# Patient Record
Sex: Female | Born: 1935 | Race: White | Hispanic: No | Smoking: Never smoker
Health system: Southern US, Community
[De-identification: ages and names within clinical notes are randomized; demographics above are authoritative.]

## PROBLEM LIST (undated history)

## (undated) DIAGNOSIS — M159 Polyosteoarthritis, unspecified: Secondary | ICD-10-CM

## (undated) DIAGNOSIS — E669 Obesity, unspecified: Secondary | ICD-10-CM

## (undated) DIAGNOSIS — K209 Esophagitis, unspecified: Secondary | ICD-10-CM

## (undated) DIAGNOSIS — I872 Venous insufficiency (chronic) (peripheral): Secondary | ICD-10-CM

## (undated) DIAGNOSIS — I803 Phlebitis and thrombophlebitis of lower extremities, unspecified: Secondary | ICD-10-CM

## (undated) DIAGNOSIS — M1990 Unspecified osteoarthritis, unspecified site: Secondary | ICD-10-CM

## (undated) DIAGNOSIS — Z86010 Personal history of colonic polyps: Secondary | ICD-10-CM

## (undated) DIAGNOSIS — I82409 Acute embolism and thrombosis of unspecified deep veins of unspecified lower extremity: Secondary | ICD-10-CM

## (undated) DIAGNOSIS — M109 Gout, unspecified: Secondary | ICD-10-CM

## (undated) DIAGNOSIS — R609 Edema, unspecified: Secondary | ICD-10-CM

## (undated) DIAGNOSIS — M25559 Pain in unspecified hip: Secondary | ICD-10-CM

## (undated) DIAGNOSIS — N951 Menopausal and female climacteric states: Secondary | ICD-10-CM

## (undated) DIAGNOSIS — I10 Essential (primary) hypertension: Secondary | ICD-10-CM

## (undated) HISTORY — DX: Esophagitis, unspecified: K20.9

## (undated) HISTORY — DX: Personal history of colonic polyps: Z86.010

## (undated) HISTORY — DX: Pain in unspecified hip: M25.559

## (undated) HISTORY — DX: Venous insufficiency (chronic) (peripheral): I87.2

## (undated) HISTORY — DX: Obesity, unspecified: E66.9

## (undated) HISTORY — DX: Gout, unspecified: M10.9

## (undated) HISTORY — DX: Unspecified osteoarthritis, unspecified site: M19.90

## (undated) HISTORY — DX: Acute embolism and thrombosis of unspecified deep veins of unspecified lower extremity: I82.409

## (undated) HISTORY — DX: Menopausal and female climacteric states: N95.1

## (undated) HISTORY — DX: Phlebitis and thrombophlebitis of lower extremities, unspecified: I80.3

## (undated) HISTORY — PX: ABDOMINAL HYSTERECTOMY: SHX81

## (undated) HISTORY — DX: Polyosteoarthritis, unspecified: M15.9

## (undated) HISTORY — DX: Essential (primary) hypertension: I10

## (undated) HISTORY — DX: Edema, unspecified: R60.9

---

## 1998-10-06 ENCOUNTER — Other Ambulatory Visit: Admission: RE | Admit: 1998-10-06 | Discharge: 1998-10-06 | Payer: Self-pay | Admitting: Obstetrics and Gynecology

## 1999-04-14 ENCOUNTER — Emergency Department (HOSPITAL_COMMUNITY): Admission: EM | Admit: 1999-04-14 | Discharge: 1999-04-14 | Payer: Self-pay | Admitting: Emergency Medicine

## 1999-04-14 ENCOUNTER — Encounter: Payer: Self-pay | Admitting: Emergency Medicine

## 1999-10-17 ENCOUNTER — Other Ambulatory Visit: Admission: RE | Admit: 1999-10-17 | Discharge: 1999-10-17 | Payer: Self-pay | Admitting: Obstetrics and Gynecology

## 2000-10-22 ENCOUNTER — Other Ambulatory Visit: Admission: RE | Admit: 2000-10-22 | Discharge: 2000-10-22 | Payer: Self-pay | Admitting: Obstetrics and Gynecology

## 2002-07-15 ENCOUNTER — Encounter: Payer: Self-pay | Admitting: Internal Medicine

## 2003-05-26 ENCOUNTER — Emergency Department (HOSPITAL_COMMUNITY): Admission: EM | Admit: 2003-05-26 | Discharge: 2003-05-26 | Payer: Self-pay | Admitting: Family Medicine

## 2003-05-26 ENCOUNTER — Ambulatory Visit (HOSPITAL_COMMUNITY): Admission: RE | Admit: 2003-05-26 | Discharge: 2003-05-26 | Payer: Self-pay | Admitting: Family Medicine

## 2004-01-19 ENCOUNTER — Ambulatory Visit: Payer: Self-pay | Admitting: Internal Medicine

## 2004-07-05 ENCOUNTER — Ambulatory Visit: Payer: Self-pay | Admitting: Internal Medicine

## 2004-10-10 ENCOUNTER — Ambulatory Visit: Payer: Self-pay | Admitting: Internal Medicine

## 2004-10-18 ENCOUNTER — Ambulatory Visit: Payer: Self-pay | Admitting: Cardiology

## 2004-10-18 ENCOUNTER — Ambulatory Visit: Payer: Self-pay | Admitting: Internal Medicine

## 2004-11-24 ENCOUNTER — Ambulatory Visit: Payer: Self-pay | Admitting: Internal Medicine

## 2004-11-24 ENCOUNTER — Encounter: Admission: RE | Admit: 2004-11-24 | Discharge: 2004-11-24 | Payer: Self-pay | Admitting: Internal Medicine

## 2004-11-27 ENCOUNTER — Ambulatory Visit: Payer: Self-pay | Admitting: Internal Medicine

## 2004-12-04 ENCOUNTER — Ambulatory Visit: Payer: Self-pay | Admitting: Internal Medicine

## 2005-02-14 ENCOUNTER — Ambulatory Visit: Payer: Self-pay | Admitting: Internal Medicine

## 2005-07-30 ENCOUNTER — Ambulatory Visit: Payer: Self-pay | Admitting: Internal Medicine

## 2006-01-22 ENCOUNTER — Ambulatory Visit: Payer: Self-pay | Admitting: Internal Medicine

## 2006-01-22 LAB — CONVERTED CEMR LAB
ALT: 14 units/L (ref 0–40)
AST: 16 units/L (ref 0–37)
Albumin: 3.4 g/dL — ABNORMAL LOW (ref 3.5–5.2)
Alkaline Phosphatase: 71 units/L (ref 39–117)
BUN: 17 mg/dL (ref 6–23)
Basophils Absolute: 0.1 10*3/uL (ref 0.0–0.1)
Basophils Relative: 0.8 % (ref 0.0–1.0)
CO2: 32 meq/L (ref 19–32)
Calcium: 9.8 mg/dL (ref 8.4–10.5)
Chloride: 103 meq/L (ref 96–112)
Chol/HDL Ratio, serum: 3
Cholesterol: 149 mg/dL (ref 0–200)
Creatinine, Ser: 0.7 mg/dL (ref 0.4–1.2)
Eosinophil percent: 3.3 % (ref 0.0–5.0)
GFR calc non Af Amer: 88 mL/min
Glomerular Filtration Rate, Af Am: 107 mL/min/{1.73_m2}
Glucose, Bld: 96 mg/dL (ref 70–99)
HCT: 38.8 % (ref 36.0–46.0)
HDL: 49.7 mg/dL (ref >39.0)
Hemoglobin: 12.9 g/dL (ref 12.0–15.0)
LDL Cholesterol: 79 mg/dL (ref 0–99)
Lymphocytes Relative: 25.4 % (ref 12.0–46.0)
MCHC: 33.4 g/dL (ref 30.0–36.0)
MCV: 89.3 fL (ref 78.0–100.0)
Monocytes Absolute: 0.6 10*3/uL (ref 0.2–0.7)
Monocytes Relative: 5.8 % (ref 3.0–11.0)
Neutro Abs: 6.2 10*3/uL (ref 1.4–7.7)
Neutrophils Relative %: 64.7 % (ref 43.0–77.0)
Platelets: 435 10*3/uL — ABNORMAL HIGH (ref 150–400)
Potassium: 4.2 meq/L (ref 3.5–5.1)
RBC: 4.34 M/uL (ref 3.87–5.11)
RDW: 13.1 % (ref 11.5–14.6)
Sodium: 143 meq/L (ref 135–145)
TSH: 1.75 microintl units/mL (ref 0.35–5.50)
Total Bilirubin: 0.7 mg/dL (ref 0.3–1.2)
Total Protein: 7 g/dL (ref 6.0–8.3)
Triglyceride fasting, serum: 104 mg/dL (ref 0–149)
VLDL: 21 mg/dL (ref 0–40)
WBC: 9.6 10*3/uL (ref 4.5–10.5)

## 2006-01-29 ENCOUNTER — Ambulatory Visit: Payer: Self-pay | Admitting: Internal Medicine

## 2006-05-23 ENCOUNTER — Encounter: Payer: Self-pay | Admitting: Internal Medicine

## 2006-07-08 ENCOUNTER — Ambulatory Visit: Payer: Self-pay | Admitting: Internal Medicine

## 2006-09-27 ENCOUNTER — Ambulatory Visit: Payer: Self-pay | Admitting: Internal Medicine

## 2006-09-27 DIAGNOSIS — N951 Menopausal and female climacteric states: Secondary | ICD-10-CM

## 2006-09-27 DIAGNOSIS — K209 Esophagitis, unspecified without bleeding: Secondary | ICD-10-CM

## 2006-09-27 DIAGNOSIS — I10 Essential (primary) hypertension: Secondary | ICD-10-CM

## 2006-09-27 DIAGNOSIS — M159 Polyosteoarthritis, unspecified: Secondary | ICD-10-CM

## 2006-09-27 DIAGNOSIS — E669 Obesity, unspecified: Secondary | ICD-10-CM

## 2006-09-27 HISTORY — DX: Polyosteoarthritis, unspecified: M15.9

## 2006-09-27 HISTORY — DX: Menopausal and female climacteric states: N95.1

## 2006-09-27 HISTORY — DX: Esophagitis, unspecified without bleeding: K20.90

## 2006-09-27 HISTORY — DX: Obesity, unspecified: E66.9

## 2006-11-28 ENCOUNTER — Ambulatory Visit: Payer: Self-pay | Admitting: Internal Medicine

## 2006-11-28 DIAGNOSIS — Z860100 Personal history of colon polyps, unspecified: Secondary | ICD-10-CM | POA: Insufficient documentation

## 2006-11-28 DIAGNOSIS — M1990 Unspecified osteoarthritis, unspecified site: Secondary | ICD-10-CM

## 2006-11-28 DIAGNOSIS — I10 Essential (primary) hypertension: Secondary | ICD-10-CM | POA: Insufficient documentation

## 2006-11-28 HISTORY — DX: Personal history of colon polyps, unspecified: Z86.0100

## 2006-11-28 HISTORY — DX: Unspecified osteoarthritis, unspecified site: M19.90

## 2006-11-28 HISTORY — DX: Essential (primary) hypertension: I10

## 2006-11-28 HISTORY — DX: Personal history of colonic polyps: Z86.010

## 2007-01-30 ENCOUNTER — Ambulatory Visit: Payer: Self-pay | Admitting: Internal Medicine

## 2007-01-30 DIAGNOSIS — M25559 Pain in unspecified hip: Secondary | ICD-10-CM

## 2007-01-30 HISTORY — DX: Pain in unspecified hip: M25.559

## 2007-02-14 ENCOUNTER — Telehealth: Payer: Self-pay | Admitting: Internal Medicine

## 2007-02-21 ENCOUNTER — Ambulatory Visit: Payer: Self-pay | Admitting: Internal Medicine

## 2007-02-21 DIAGNOSIS — R609 Edema, unspecified: Secondary | ICD-10-CM

## 2007-02-21 HISTORY — DX: Edema, unspecified: R60.9

## 2007-03-06 ENCOUNTER — Telehealth: Payer: Self-pay | Admitting: Internal Medicine

## 2007-03-06 ENCOUNTER — Encounter: Payer: Self-pay | Admitting: Internal Medicine

## 2007-03-06 ENCOUNTER — Ambulatory Visit: Payer: Self-pay

## 2007-03-07 ENCOUNTER — Ambulatory Visit: Payer: Self-pay | Admitting: Internal Medicine

## 2007-03-07 DIAGNOSIS — I803 Phlebitis and thrombophlebitis of lower extremities, unspecified: Secondary | ICD-10-CM

## 2007-03-07 HISTORY — DX: Phlebitis and thrombophlebitis of lower extremities, unspecified: I80.3

## 2007-03-10 ENCOUNTER — Ambulatory Visit: Payer: Self-pay | Admitting: Internal Medicine

## 2007-03-10 DIAGNOSIS — I82409 Acute embolism and thrombosis of unspecified deep veins of unspecified lower extremity: Secondary | ICD-10-CM | POA: Insufficient documentation

## 2007-03-10 HISTORY — DX: Acute embolism and thrombosis of unspecified deep veins of unspecified lower extremity: I82.409

## 2007-03-10 LAB — CONVERTED CEMR LAB
INR: 1
Prothrombin Time: 12.4 s

## 2007-03-12 ENCOUNTER — Encounter: Payer: Self-pay | Admitting: Internal Medicine

## 2007-03-14 ENCOUNTER — Ambulatory Visit: Payer: Self-pay | Admitting: Internal Medicine

## 2007-03-14 LAB — CONVERTED CEMR LAB
INR: 1.7
Prothrombin Time: 16 s

## 2007-03-21 ENCOUNTER — Ambulatory Visit: Payer: Self-pay | Admitting: Internal Medicine

## 2007-03-21 LAB — CONVERTED CEMR LAB
INR: 2.3
Prothrombin Time: 18.4 s

## 2007-04-18 ENCOUNTER — Ambulatory Visit: Payer: Self-pay | Admitting: Internal Medicine

## 2007-04-18 LAB — CONVERTED CEMR LAB
INR: 3.2
Prothrombin Time: 21.5 s

## 2007-05-16 ENCOUNTER — Ambulatory Visit: Payer: Self-pay | Admitting: Internal Medicine

## 2007-05-16 LAB — CONVERTED CEMR LAB
INR: 1.9
Prothrombin Time: 16.9 s

## 2007-05-28 ENCOUNTER — Ambulatory Visit: Admission: RE | Admit: 2007-05-28 | Discharge: 2007-05-28 | Payer: Self-pay | Admitting: Gynecology

## 2007-05-29 ENCOUNTER — Ambulatory Visit: Payer: Self-pay | Admitting: Internal Medicine

## 2007-05-29 LAB — CONVERTED CEMR LAB
ALT: 16 units/L (ref 0–35)
Basophils Absolute: 0 10*3/uL (ref 0.0–0.1)
Basophils Relative: 0.6 % (ref 0.0–1.0)
Blood in Urine, dipstick: NEGATIVE
CO2: 32 meq/L (ref 19–32)
Calcium: 9.7 mg/dL (ref 8.4–10.5)
Cholesterol: 159 mg/dL (ref 0–200)
Creatinine, Ser: 0.6 mg/dL (ref 0.4–1.2)
GFR calc Af Amer: 127 mL/min
Glucose, Bld: 112 mg/dL — ABNORMAL HIGH (ref 70–99)
Glucose, Urine, Semiquant: NEGATIVE
HCT: 40.7 % (ref 36.0–46.0)
Hemoglobin: 13.3 g/dL (ref 12.0–15.0)
LDL Cholesterol: 106 mg/dL — ABNORMAL HIGH (ref 0–99)
Lymphocytes Relative: 32 % (ref 12.0–46.0)
MCHC: 32.8 g/dL (ref 30.0–36.0)
Monocytes Absolute: 0.5 10*3/uL (ref 0.1–1.0)
Neutro Abs: 4.8 10*3/uL (ref 1.4–7.7)
Nitrite: NEGATIVE
Protein, U semiquant: NEGATIVE
RBC: 4.6 M/uL (ref 3.87–5.11)
RDW: 13.4 % (ref 11.5–14.6)
Specific Gravity, Urine: >=1.03
TSH: 1.59 microintl units/mL (ref 0.35–5.50)
Total Protein: 6.9 g/dL (ref 6.0–8.3)
Triglycerides: 83 mg/dL (ref 0–149)
Urobilinogen, UA: 0.2
WBC Urine, dipstick: NEGATIVE
pH: 7

## 2007-06-04 ENCOUNTER — Ambulatory Visit: Payer: Self-pay | Admitting: Internal Medicine

## 2007-06-04 DIAGNOSIS — M109 Gout, unspecified: Secondary | ICD-10-CM

## 2007-06-04 HISTORY — DX: Gout, unspecified: M10.9

## 2007-06-13 ENCOUNTER — Ambulatory Visit: Payer: Self-pay | Admitting: Internal Medicine

## 2007-06-13 LAB — CONVERTED CEMR LAB
INR: 2
Prothrombin Time: 17.3 s

## 2007-06-17 ENCOUNTER — Ambulatory Visit: Payer: Self-pay | Admitting: Internal Medicine

## 2007-07-10 ENCOUNTER — Ambulatory Visit: Payer: Self-pay | Admitting: Internal Medicine

## 2007-07-23 ENCOUNTER — Telehealth: Payer: Self-pay | Admitting: Internal Medicine

## 2007-08-07 ENCOUNTER — Ambulatory Visit: Payer: Self-pay | Admitting: Internal Medicine

## 2007-09-04 ENCOUNTER — Ambulatory Visit: Payer: Self-pay | Admitting: Internal Medicine

## 2007-09-04 LAB — CONVERTED CEMR LAB
INR: 1.7
Prothrombin Time: 15.9 s

## 2007-09-22 ENCOUNTER — Ambulatory Visit: Payer: Self-pay | Admitting: Internal Medicine

## 2007-10-28 ENCOUNTER — Ambulatory Visit: Payer: Self-pay

## 2007-10-28 ENCOUNTER — Ambulatory Visit: Payer: Self-pay | Admitting: Internal Medicine

## 2007-10-28 ENCOUNTER — Encounter (INDEPENDENT_AMBULATORY_CARE_PROVIDER_SITE_OTHER): Payer: Self-pay | Admitting: *Deleted

## 2007-10-28 ENCOUNTER — Telehealth: Payer: Self-pay | Admitting: Internal Medicine

## 2007-11-04 ENCOUNTER — Telehealth: Payer: Self-pay | Admitting: Internal Medicine

## 2007-11-18 ENCOUNTER — Ambulatory Visit: Payer: Self-pay | Admitting: Internal Medicine

## 2007-11-26 ENCOUNTER — Telehealth: Payer: Self-pay | Admitting: Internal Medicine

## 2007-11-26 ENCOUNTER — Encounter: Payer: Self-pay | Admitting: Internal Medicine

## 2008-01-14 ENCOUNTER — Ambulatory Visit: Payer: Self-pay | Admitting: Internal Medicine

## 2008-02-16 ENCOUNTER — Ambulatory Visit: Payer: Self-pay | Admitting: Gastroenterology

## 2008-03-01 ENCOUNTER — Encounter: Payer: Self-pay | Admitting: Gastroenterology

## 2008-03-01 ENCOUNTER — Ambulatory Visit: Payer: Self-pay | Admitting: Gastroenterology

## 2008-03-02 ENCOUNTER — Encounter: Payer: Self-pay | Admitting: Gastroenterology

## 2008-03-31 ENCOUNTER — Ambulatory Visit: Payer: Self-pay | Admitting: Internal Medicine

## 2008-03-31 DIAGNOSIS — R079 Chest pain, unspecified: Secondary | ICD-10-CM | POA: Insufficient documentation

## 2008-04-30 ENCOUNTER — Telehealth: Payer: Self-pay | Admitting: Internal Medicine

## 2008-05-27 ENCOUNTER — Encounter: Payer: Self-pay | Admitting: Internal Medicine

## 2008-06-11 ENCOUNTER — Ambulatory Visit: Payer: Self-pay | Admitting: Internal Medicine

## 2008-06-11 LAB — CONVERTED CEMR LAB
ALT: 20 units/L (ref 0–35)
AST: 22 units/L (ref 0–37)
BUN: 21 mg/dL (ref 6–23)
Bilirubin Urine: NEGATIVE
Eosinophils Relative: 6.9 % — ABNORMAL HIGH (ref 0.0–5.0)
GFR calc non Af Amer: 104.66 mL/min (ref >60)
Glucose, Urine, Semiquant: NEGATIVE
HCT: 39.5 % (ref 36.0–46.0)
Hemoglobin: 13.4 g/dL (ref 12.0–15.0)
Ketones, urine, test strip: NEGATIVE
Lymphs Abs: 2.4 10*3/uL (ref 0.7–4.0)
Monocytes Relative: 7.8 % (ref 3.0–12.0)
Platelets: 320 10*3/uL (ref 150.0–400.0)
Potassium: 3.5 meq/L (ref 3.5–5.1)
RBC: 4.48 M/uL (ref 3.87–5.11)
Sodium: 141 meq/L (ref 135–145)
Specific Gravity, Urine: <1.005
TSH: 0.81 microintl units/mL (ref 0.35–5.50)
Total Bilirubin: 0.7 mg/dL (ref 0.3–1.2)
WBC: 8.8 10*3/uL (ref 4.5–10.5)
pH: 5

## 2008-07-29 ENCOUNTER — Ambulatory Visit: Payer: Self-pay | Admitting: Internal Medicine

## 2008-11-17 ENCOUNTER — Ambulatory Visit (HOSPITAL_COMMUNITY): Admission: RE | Admit: 2008-11-17 | Discharge: 2008-11-18 | Payer: Self-pay | Admitting: Obstetrics and Gynecology

## 2008-11-17 ENCOUNTER — Encounter (INDEPENDENT_AMBULATORY_CARE_PROVIDER_SITE_OTHER): Payer: Self-pay | Admitting: Obstetrics and Gynecology

## 2009-01-28 ENCOUNTER — Ambulatory Visit: Payer: Self-pay | Admitting: Internal Medicine

## 2009-06-07 ENCOUNTER — Encounter: Payer: Self-pay | Admitting: Internal Medicine

## 2009-07-29 ENCOUNTER — Ambulatory Visit: Payer: Self-pay | Admitting: Internal Medicine

## 2009-09-02 ENCOUNTER — Ambulatory Visit: Payer: Self-pay

## 2009-09-02 ENCOUNTER — Encounter: Payer: Self-pay | Admitting: Cardiovascular Disease

## 2009-09-02 ENCOUNTER — Telehealth: Payer: Self-pay | Admitting: Family Medicine

## 2009-09-02 ENCOUNTER — Ambulatory Visit: Payer: Self-pay | Admitting: Family Medicine

## 2009-09-06 ENCOUNTER — Ambulatory Visit: Payer: Self-pay | Admitting: Internal Medicine

## 2009-09-06 ENCOUNTER — Telehealth (INDEPENDENT_AMBULATORY_CARE_PROVIDER_SITE_OTHER): Payer: Self-pay | Admitting: *Deleted

## 2009-09-06 DIAGNOSIS — I872 Venous insufficiency (chronic) (peripheral): Secondary | ICD-10-CM

## 2009-09-06 HISTORY — DX: Venous insufficiency (chronic) (peripheral): I87.2

## 2009-09-07 ENCOUNTER — Telehealth: Payer: Self-pay | Admitting: Internal Medicine

## 2009-09-13 ENCOUNTER — Encounter: Payer: Self-pay | Admitting: Internal Medicine

## 2009-10-18 ENCOUNTER — Encounter: Payer: Self-pay | Admitting: Internal Medicine

## 2009-10-20 ENCOUNTER — Ambulatory Visit: Payer: Self-pay | Admitting: Vascular Surgery

## 2009-10-20 ENCOUNTER — Encounter: Payer: Self-pay | Admitting: Internal Medicine

## 2009-10-25 ENCOUNTER — Encounter: Payer: Self-pay | Admitting: Internal Medicine

## 2009-11-16 ENCOUNTER — Ambulatory Visit: Payer: Self-pay | Admitting: Internal Medicine

## 2009-11-22 ENCOUNTER — Encounter: Payer: Self-pay | Admitting: Internal Medicine

## 2010-01-27 ENCOUNTER — Encounter: Payer: Self-pay | Admitting: Internal Medicine

## 2010-01-27 ENCOUNTER — Ambulatory Visit: Payer: Self-pay | Admitting: Internal Medicine

## 2010-01-27 LAB — CONVERTED CEMR LAB
AST: 16 units/L (ref 0–37)
Albumin: 3.8 g/dL (ref 3.5–5.2)
Alkaline Phosphatase: 92 units/L (ref 39–117)
Basophils Relative: 0.7 % (ref 0.0–3.0)
CO2: 31 meq/L (ref 19–32)
Calcium: 9.5 mg/dL (ref 8.4–10.5)
GFR calc non Af Amer: 117.65 mL/min (ref >60)
HDL: 49 mg/dL (ref >39.00)
Hemoglobin: 13.8 g/dL (ref 12.0–15.0)
Lymphocytes Relative: 28.1 % (ref 12.0–46.0)
Monocytes Relative: 6.4 % (ref 3.0–12.0)
Neutro Abs: 5.6 10*3/uL (ref 1.4–7.7)
RBC: 4.5 M/uL (ref 3.87–5.11)
Sodium: 141 meq/L (ref 135–145)
Total CHOL/HDL Ratio: 3
Total Protein: 6.9 g/dL (ref 6.0–8.3)
VLDL: 12.6 mg/dL (ref 0.0–40.0)
WBC: 9.1 10*3/uL (ref 4.5–10.5)

## 2010-03-30 NOTE — Letter (Signed)
Summary: [facility]  [facility]   Imported By: Maryln Gottron 10/28/2009 15:36:02  _____________________________________________________________________  External Attachment:    Type:   Image     Comment:   External Document

## 2010-03-30 NOTE — Assessment & Plan Note (Signed)
Summary: R LEG PAIN // RS   Vital Signs:  Patient profile:   75 year old female Weight:      211 pounds Temp:     97.9 degrees F oral BP sitting:   122 / 76  (right arm) Cuff size:   regular  Vitals Entered By: Duard Brady LPN (September 06, 2009 10:21 AM) CC: c/o no improvment in (R) leg pain - worse Is Patient Diabetic? No   CC:  c/o no improvment in (R) leg pain - worse.  History of Present Illness: a 75 year old patient has a history of right leg DVT last treated in 2009.  She presents with a 10-day history of increasing pain and swelling involving the right leg.  Five days ago.  She was evaluated and venous Doppler examination was negative.  She continues to have persistent pain and swelling.  Denies any pulmonary complaints.  Blood pressure regimen includes diuretic therapy.  Allergies: 1)  ! Sulf-10 2)  ! Bactrim Ds (Sulfamethoxazole-Trimethoprim)  Past History:  Past Medical History: Colonic polyps, hx of Hypertension Osteoarthritis history of lower extremity DVT January 2009 Fibroids impaired glucose tolerance chronic venous insufficiency  Review of Systems       The patient complains of peripheral edema and difficulty walking.  The patient denies anorexia, fever, weight loss, weight gain, vision loss, decreased hearing, hoarseness, chest pain, syncope, dyspnea on exertion, prolonged cough, headaches, hemoptysis, abdominal pain, melena, hematochezia, severe indigestion/heartburn, hematuria, incontinence, genital sores, muscle weakness, suspicious skin lesions, transient blindness, depression, unusual weight change, abnormal bleeding, enlarged lymph nodes, angioedema, and breast masses.    Physical Exam  General:  overweight-appearing.  no distress.  Blood pressure low normaloverweight-appearing.   Neck:  No deformities, masses, or tenderness noted. Lungs:  Normal respiratory effort, chest expands symmetrically. Lungs are clear to auscultation, no crackles or  wheezes. Heart:  Normal rate and regular rhythm. S1 and S2 normal without gallop, murmur, click, rub or other extra sounds. Extremities:  there is slight tenderness and soft tissue swelling involving her right leg distal to the knee   Impression & Recommendations:  Problem # 1:  LEG EDEMA (ICD-782.3)  Her updated medication list for this problem includes:    Hydrochlorothiazide 25 Mg Tabs (Hydrochlorothiazide) .Marland Kitchen... 1 once daily will review the results of the venous Doppler that are not available in the chart.  Will refer to [facility] for further management of suspected chronic venous insufficiency    Her updated medication list for this problem includes:    Hydrochlorothiazide 25 Mg Tabs (Hydrochlorothiazide) .Marland Kitchen... 1 once daily  Problem # 2:  HYPERTENSION (ICD-401.9)  Her updated medication list for this problem includes:    Hydrochlorothiazide 25 Mg Tabs (Hydrochlorothiazide) .Marland Kitchen... 1 once daily  Her updated medication list for this problem includes:    Hydrochlorothiazide 25 Mg Tabs (Hydrochlorothiazide) .Marland Kitchen... 1 once daily  Complete Medication List: 1)  Klor-con M20 20 Meq Tbcr (Potassium chloride crys cr) .Marland Kitchen.. 1 once daily 2)  Hydrochlorothiazide 25 Mg Tabs (Hydrochlorothiazide) .Marland Kitchen.. 1 once daily 3)  Clobetasol Propionate E 0.05 % Crea (Clobetasol prop emollient base) .... Use as directed as needed 4)  Triamcinolone Acetonide 0.1 % Crea (Triamcinolone acetonide) .... Applied twice daily to affected area as needed 5)  Tramadol Hcl 50 Mg Tabs (Tramadol hcl) .... One every 6 hours as needed for pain  Other Orders: [facility] (Vascular)  Patient Instructions: 1)  Limit your Sodium (Salt). 2)  Take 400-600mg  of Ibuprofen (Advil, Motrin) with food every  4-6 hours as needed for relief of pain or comfort of fever. 3)  follow-up vascular surgery Prescriptions: TRAMADOL HCL 50 MG TABS (TRAMADOL HCL) one every 6 hours as needed for pain  #90 x 4   Entered and Authorized by:   Gordy Savers  MD   Signed by:   Gordy Savers  MD on 09/06/2009   Method used:   Electronically to        [facility] [address] [phone]* (retail)       [address]       [place], [place]  [address]       Ph: [phone] or [phone]       Fax: [phone]   RxID:   [id number]

## 2010-03-30 NOTE — Assessment & Plan Note (Signed)
Summary: FLU-SHOT/RCD  Nurse Visit  Appended Document: FLU-SHOT/RCD    Nurse Visit   Immunizations Administered:  Influenza Vaccine # 1:    Vaccine Type: Fluvax MCR    Site: left deltoid    Mfr: GlaxoSmithKline    Dose: 0.5 ml    Route: IM    Given by: Duard Brady LPN    Exp. Date: 08/26/2010    Lot #: [id number]    VIS given: 09/20/09 version given December 05, 2009.    Physician counseled: yes  Flu Vaccine Consent Questions:    Do you have a history of severe allergic reactions to this vaccine? no    Any prior history of allergic reactions to egg and/or gelatin? no    Do you have a sensitivity to the preservative Thimersol? no    Do you have a past history of Guillan-Barre Syndrome? no    Do you currently have an acute febrile illness? no    Have you ever had a severe reaction to latex? no    Vaccine information given and explained to patient? yes    Are you currently pregnant? no  Orders Added: 1)  Influenza Vaccine MCR [00025]

## 2010-03-30 NOTE — Assessment & Plan Note (Signed)
Summary: 6 month rov/njr   Vital Signs:  Patient profile:   75 year old female Weight:      215 pounds Temp:     97.6 degrees F oral BP sitting:   136 / 80  (right arm) Cuff size:   regular  Vitals Entered By: Duard Brady LPN (July 30, 9627 8:24 AM) CC: 6 mos rov - doing well , please check (R) leg  Is Patient Diabetic? No   CC:  6 mos rov - doing well  and please check (R) leg .  History of Present Illness: 75 year old patient who is seen today for follow-up of her hypertension.  She has osteoarthritis and venous insufficiency.  She is doing quite well, although under a considerable stress.  She cares for a disabled son.  Her blood pressure has been stable.  She has had a recent mammogram  Preventive Screening-Counseling & Management  Alcohol-Tobacco     Smoking Status: never  Allergies: 1)  ! Sulf-10 2)  ! Bactrim Ds (Sulfamethoxazole-Trimethoprim)  Past History:  Past Medical History: Reviewed history from 07/29/2008 and no changes required. Colonic polyps, hx of Hypertension Osteoarthritis history of lower extremity DVT January 2009 Fibroids impaired glucose tolerance  Past Surgical History: Reviewed history from 01/28/2009 and no changes required. Cholecystectomy  1998 Hysterectomy   cataract surgery os colonoscopy in January 2010  bilateral salpingo-oophorectomy  in September 2010  Review of Systems  The patient denies anorexia, fever, weight loss, weight gain, vision loss, decreased hearing, hoarseness, chest pain, syncope, dyspnea on exertion, peripheral edema, prolonged cough, headaches, hemoptysis, abdominal pain, melena, hematochezia, severe indigestion/heartburn, hematuria, incontinence, genital sores, muscle weakness, suspicious skin lesions, transient blindness, difficulty walking, depression, unusual weight change, abnormal bleeding, enlarged lymph nodes, angioedema, and breast masses.    Physical Exam  General:  overweight-appearing.   130/80overweight-appearing.   Head:  Normocephalic and atraumatic without obvious abnormalities. No apparent alopecia or balding. Eyes:  No corneal or conjunctival inflammation noted. EOMI. Perrla. Funduscopic exam benign, without hemorrhages, exudates or papilledema. Vision grossly normal. Mouth:  Oral mucosa and oropharynx without lesions or exudates.  Teeth in good repair. Neck:  No deformities, masses, or tenderness noted. Lungs:  Normal respiratory effort, chest expands symmetrically. Lungs are clear to auscultation, no crackles or wheezes. Heart:  Normal rate and regular rhythm. S1 and S2 normal without gallop, murmur, click, rub or other extra sounds. Abdomen:  Bowel sounds positive,abdomen soft and non-tender without masses, organomegaly or hernias noted. Msk:  No deformity or scoliosis noted of thoracic or lumbar spine.   Skin:  erythematous patches scaly rash involving her right lateral lower leg   Impression & Recommendations:  Problem # 1:  OSTEOARTHRITIS (ICD-715.90)  The following medications were removed from the medication list:    Naprosyn 500 Mg Tabs (Naproxen) ..... One by mouth bid  The following medications were removed from the medication list:    Naprosyn 500 Mg Tabs (Naproxen) ..... One by mouth bid  Problem # 2:  HYPERTENSION (ICD-401.9)  Her updated medication list for this problem includes:    Hydrochlorothiazide 25 Mg Tabs (Hydrochlorothiazide) .Marland Kitchen... 1 once daily  Her updated medication list for this problem includes:    Hydrochlorothiazide 25 Mg Tabs (Hydrochlorothiazide) .Marland Kitchen... 1 once daily  Complete Medication List: 1)  Klor-con M20 20 Meq Tbcr (Potassium chloride crys cr) .Marland Kitchen.. 1 once daily 2)  Hydrochlorothiazide 25 Mg Tabs (Hydrochlorothiazide) .Marland Kitchen.. 1 once daily 3)  Clobetasol Propionate E 0.05 % Crea (Clobetasol  prop emollient base) .... Uad 4)  Triamcinolone Acetonide 0.1 % Crea (Triamcinolone acetonide) .... Applied twice daily  Patient  Instructions: 1)  Limit your Sodium (Salt) to less than 2 grams a day(slightly less than 1/2 a teaspoon) to prevent fluid retention, swelling, or worsening of symptoms. 2)  It is important that you exercise regularly at least 20 minutes 5 times a week. If you develop chest pain, have severe difficulty breathing, or feel very tired , stop exercising immediately and seek medical attention. 3)  You need to lose weight. Consider a lower calorie diet and regular exercise.  4)  Check your Blood Pressure regularly. If it is above: 150/90 you should make an appointment. 5)  Please schedule a follow-up appointment in 6 months for CPX 6)  Advised not to eat any food or drink any liquids after 10 PM the night before your procedure. Prescriptions: TRIAMCINOLONE ACETONIDE 0.1 % CREA (TRIAMCINOLONE ACETONIDE) applied twice daily  #120 gm x 2   Entered and Authorized by:   Gordy Savers  MD   Signed by:   Gordy Savers  MD on 07/29/2009   Method used:   Electronically to        [facility] [address] [phone]* (retail)       [address]       [place], [place]  [address]       Ph: [phone] or [phone]       Fax: [phone]   RxID:   [id number] HYDROCHLOROTHIAZIDE 25 MG TABS (HYDROCHLOROTHIAZIDE) 1 once daily  #90 x 6   Entered and Authorized by:   Gordy Savers  MD   Signed by:   Gordy Savers  MD on 07/29/2009   Method used:   Electronically to        [facility] [address] [phone]* (retail)       [address]       [place], [place]  [address]       Ph: [phone] or [phone]       Fax: [phone]   RxID:   [id number] KLOR-CON M20 20 MEQ  TBCR (POTASSIUM CHLORIDE CRYS CR) 1 once daily  #90 x 6   Entered and Authorized by:   Gordy Savers  MD   Signed by:   Gordy Savers  MD on 07/29/2009   Method used:   Electronically to        [facility] [address] [phone]* (retail)       [address]        [place], [place]  [address]       Ph: [phone] or [phone]       Fax: [phone]   RxID:   [id number]

## 2010-03-30 NOTE — Progress Notes (Signed)
Summary: ? about appt  Phone Note Call from Patient   Caller: Patient Call For: Gordy Savers  MD Summary of Call: Vascular appt is August 25, and pt wants to know if this is too long to wait??? [phone] Initial call taken by: Lynann Beaver CMA,  September 07, 2009 12:37 PM  Follow-up for Phone Call        OK not urgent Follow-up by: Gordy Savers  MD,  September 07, 2009 12:44 PM  Additional Follow-up for Phone Call Additional follow up Details #1::        Notified. pt Additional Follow-up by: Lynann Beaver CMA,  September 07, 2009 12:46 PM

## 2010-03-30 NOTE — Letter (Signed)
Summary: [facility]  [facility]   Imported By: Maryln Gottron 11/11/2009 12:59:17  _____________________________________________________________________  External Attachment:    Type:   Image     Comment:   External Document

## 2010-03-30 NOTE — Progress Notes (Signed)
Summary: dopper report  Phone Note Call from Patient Call back at Home Phone [phone]   Caller: Patient Call For: Clydette Privitera Summary of Call: pt has doppler today requesting results Initial call taken by: Heron Sabins,  September 02, 2009 4:23 PM  Follow-up for Phone Call        I called back at this number and no answer.  By verbal report NO evidence for DVT. Follow-up by: Evelena Peat MD,  September 02, 2009 5:51 PM  Additional Follow-up for Phone Call Additional follow up Details #1::        Pt informed and she voiced her understanding Additional Follow-up by: Sid Falcon LPN,  September 03, 979 6:04 PM

## 2010-03-30 NOTE — Assessment & Plan Note (Signed)
Summary: emp--will fast//ccm   Vital Signs:  Patient profile:   75 year old female Height:      65.5 inches Weight:      211 pounds BMI:     34.70 Temp:     97.9 degrees F oral BP sitting:   130 / 82  (right arm) Cuff size:   regular  Vitals Entered By: Duard Brady LPN (January 27, 2010 9:22 AM) CC: cpx - doing ok Is Patient Diabetic? No   CC:  cpx - doing ok.  History of Present Illness: 75 year old patient who is seen today for a wellness exam.  Medical problems include a history of chronic venous insufficiency and osteoarthritis, involving the right knee.  She is followed by orthopedics.  She has hypertension.  History colonic polyps and exogenous obesity.  Here for Medicare AWV:  1.   Risk factors based on Past M, S, F history:bibasilar risk factors include a history of hypertension. 2.   Physical Activities: fairly active and cares for a number of children and grandchildren 3.   Depression/mood: history of depression or mood disorder, although considerable situational stress 4.   Hearing: no hearing impairment 5.   ADL's: independent in all aspects of daily living 6.   Fall Risk: low 7.   Home Safety: no problems identified 8.   Height, weight, &visual acuity:height and weight are stable.  No change in visual acuity 9.   Counseling: heart healthy diet modest weight loss encouraged 10.   Labs ordered based on risk factors: laboratory profile, including lipid panel and TSH will be reviewed 11.           Referral Coordination- GYN referral is scheduled later this month 12.           Care Plan- heart healthy diet, weight loss encouraged 13.            Cognitive Assessment- alert and oriented, with normal affect.  History of memory dysfunction and also executive functioning without difficulty   Preventive Screening-Counseling & Management  Alcohol-Tobacco     Smoking Status: never  Allergies: 1)  ! Sulf-10 2)  ! Bactrim Ds (Sulfamethoxazole-Trimethoprim)  Past  History:  Past Medical History: Reviewed history from 09/06/2009 and no changes required. Colonic polyps, hx of Hypertension Osteoarthritis history of lower extremity DVT January 2009 Fibroids impaired glucose tolerance chronic venous insufficiency  Past Surgical History: Reviewed history from 01/28/2009 and no changes required. Cholecystectomy  1998 Hysterectomy   cataract surgery os colonoscopy in January 2010  bilateral salpingo-oophorectomy  in September 2010  Family History: Reviewed history from 06/17/2007 and no changes required. age 42, history of Parkinson's disease, senile dementia mother has  senile dementia presently 64 one brother in good health one sister died of cardiac complications at age 5, history of EtOH  Social History: Reviewed history from 07/29/2008 and no changes required. cares for a disabled son 4 children, and 3 adopted children.  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    Physical Exam  General:  overweight-appearing.  normal blood pressure Head:  Normocephalic and atraumatic  without obvious abnormalities. No apparent alopecia or balding. Eyes:  No corneal or conjunctival inflammation noted. EOMI. Perrla. Funduscopic exam benign, without hemorrhages, exudates or papilledema. Vision grossly normal. Ears:  External ear exam shows no significant lesions or deformities.  Otoscopic  examination reveals clear canals, tympanic membranes are intact bilaterally without bulging, retraction, inflammation or discharge. Hearing is grossly normal bilaterally. Nose:  External nasal examination shows no deformity or inflammation. Nasal mucosa are pink and moist without lesions or exudates. Mouth:  Oral mucosa and oropharynx without lesions or exudates.  Teeth in good repair. Neck:  No deformities, masses, or tenderness noted. Chest Wall:  No deformities, masses, or tenderness noted. Breasts:  No mass, nodules, thickening, tenderness, bulging, retraction, inflamation, nipple discharge or skin changes noted.   Lungs:  Normal respiratory effort, chest expands symmetrically. Lungs are clear to auscultation, no crackles or wheezes. Heart:  Normal rate and regular rhythm. S1 and S2 normal without gallop, murmur, click, rub or other extra sounds. Abdomen:  Bowel sounds positive,abdomen soft and non-tender without masses, organomegaly or hernias noted. Msk:  No deformity or scoliosis noted of thoracic or lumbar spine.   Pulses:  R and L carotid,radial,femoral,dorsalis pedis and posterior tibial pulses are full and equal bilaterally Extremities:  support hose, right leg Neurologic:  No cranial nerve deficits noted. Station and gait are normal. Plantar reflexes are down-going bilaterally. DTRs are symmetrical throughout. Sensory, motor and coordinative functions appear intact. Skin:  Intact without suspicious lesions or rashes Cervical Nodes:  No lymphadenopathy noted Axillary Nodes:  No palpable lymphadenopathy Inguinal Nodes:  No significant adenopathy Psych:  Cognition and judgment appear intact. Alert and cooperative with normal attention span and concentration. No apparent delusions, illusions, hallucinations   Impression & Recommendations:  Problem # 1:  PHYSICAL EXAMINATION (ICD-V70.0)  Orders: Medicare -1st Annual Wellness Visit [phone]) Venipuncture ([address]) TLB-Lipid Panel  (80061-LIPID) TLB-BMP (Basic Metabolic Panel-BMET) (80048-METABOL) TLB-CBC Platelet - w/Differential (85025-CBCD) TLB-Hepatic/Liver Function Pnl (80076-HEPATIC)  Problem # 2:  VENOUS INSUFFICIENCY, CHRONIC, RIGHT LEG (ICD-459.81)  Orders: EKG w/ Interpretation (93000)  Problem # 3:  OSTEOARTHRITIS (ICD-715.90)  Her updated medication list for this problem includes:    Tramadol Hcl 50 Mg Tabs (Tramadol hcl) ..... One every 6 hours as needed for pain  Orders: TLB-BMP (Basic Metabolic Panel-BMET) (80048-METABOL) TLB-CBC Platelet - w/Differential (85025-CBCD) TLB-Hepatic/Liver Function Pnl (80076-HEPATIC)  Problem # 4:  HYPERTENSION, BENIGN ESSENTIAL (ICD-401.1)  Her updated medication list for this problem includes:    Hydrochlorothiazide 25 Mg Tabs (Hydrochlorothiazide) .Marland Kitchen... 1 once daily  Complete Medication List: 1)  Klor-con M20 20 Meq Tbcr (Potassium chloride crys cr) .Marland Kitchen.. 1 once daily 2)  Hydrochlorothiazide 25 Mg Tabs (Hydrochlorothiazide) .Marland Kitchen.. 1 once daily 3)  Clobetasol Propionate E 0.05 % Crea (Clobetasol prop emollient base) .... Use as directed as needed 4)  Triamcinolone Acetonide 0.1 % Crea (Triamcinolone acetonide) .... Applied twice daily to affected area as needed 5)  Tramadol Hcl 50 Mg Tabs (Tramadol hcl) .... One every 6 hours as needed for pain 6)  Vitamin B-12 1000 Mcg Tabs (Cyanocobalamin) .... Qd  Other Orders: TLB-TSH (Thyroid Stimulating Hormone) (84443-TSH)  Patient Instructions: 1)  Please schedule a follow-up appointment in 6 months. 2)  Limit your Sodium (Salt). 3)  It is important that you exercise regularly at least 20 minutes 5 times a week. If you develop chest pain, have severe difficulty breathing, or feel very tired , stop exercising immediately and seek medical attention. 4)  You need to lose weight.  Consider a lower calorie diet and regular exercise.  5)  Take calcium +Vitamin D daily. Prescriptions: TRAMADOL HCL 50 MG TABS (TRAMADOL HCL)  one every 6 hours as needed for pain  #90 x 4   Entered and Authorized by:   Gordy Savers  MD   Signed by:   Gordy Savers  MD on 01/27/2010   Method used:   Electronically to        [facility] [address] [phone]* (retail)       [address]       [place], [place]  [address]       Ph: [phone] or [phone]       Fax: [phone]   RxID:   [id number] HYDROCHLOROTHIAZIDE 25 MG TABS (HYDROCHLOROTHIAZIDE) 1 once daily  #90 x 6   Entered and Authorized by:   Gordy Savers  MD   Signed by:   Gordy Savers  MD on 01/27/2010   Method used:   Electronically to        [facility] [address] [phone]* (retail)       [address]       [place], [place]  [address]       Ph: [phone] or [phone]       Fax: [phone]   RxID:   [id number] KLOR-CON M20 20 MEQ  TBCR (POTASSIUM CHLORIDE CRYS CR) 1 once daily  #90 x 6   Entered and Authorized by:   Gordy Savers  MD   Signed by:   Gordy Savers  MD on 01/27/2010   Method used:   Electronically to        [facility] [address] [phone]* (retail)       [address]       [place], [place]  [address]       Ph: [phone] or [phone]       Fax: [phone]   RxID:   [id number]    Orders Added: 1)  EKG w/ Interpretation [93000] 2)  Medicare -1st Annual Wellness Visit [G0438] 3)  Est. Patient Level III [38101] 4)  Venipuncture [36415] 5)  TLB-Lipid Panel [80061-LIPID] 6)  TLB-BMP (Basic Metabolic Panel-BMET) [80048-METABOL] 7)  TLB-CBC Platelet - w/Differential [85025-CBCD] 8)  TLB-Hepatic/Liver Function Pnl [80076-HEPATIC] 9)  TLB-TSH (Thyroid Stimulating Hormone) [84443-TSH]  Appended Document: Orders Update     Clinical Lists Changes  Orders: Added new Service order of Specimen Handling (75102) - Signed

## 2010-03-30 NOTE — Miscellaneous (Signed)
Summary: Orders Update  Clinical Lists Changes  Orders: Added new Test order of Venous Duplex Lower Extremity (Venous Duplex Lower) - Signed 

## 2010-03-30 NOTE — Letter (Signed)
Summary: [name]  [name]   Imported By: Maryln Gottron 11/29/2009 15:28:30  _____________________________________________________________________  External Attachment:    Type:   Image     Comment:   External Document

## 2010-03-30 NOTE — Consult Note (Signed)
Summary: [name]  [name]   Imported By: Maryln Gottron 09/19/2009 10:31:37  _____________________________________________________________________  External Attachment:    Type:   Image     Comment:   External Document

## 2010-03-30 NOTE — Assessment & Plan Note (Signed)
Summary: leg pain hx of dvt/njr   Vital Signs:  Patient profile:   75 year old female Temp:     98.3 degrees F oral BP sitting:   150 / 84  (left arm) Cuff size:   large  Vitals Entered By: Sid Falcon LPN (September 02, 1608 2:47 PM) CC: Right lower leg pain   History of Present Illness: Patient seen with one week history of right lower extremity pain in the calf region and some swelling. Prior history of DVT last year same extremity. Denies recent injury. No history of cancer. Nonsmoker. No family history of coagulopathy.  Some progressive swelling through the week.  worse with dependency.  Patient denies any dyspnea, cough, hemoptysis, or any pleuritic pain. Swelling has increased gradually over the week.  Allergies: 1)  ! Sulf-10 2)  ! Bactrim Ds (Sulfamethoxazole-Trimethoprim)  Past History:  Past Medical History: Last updated: 07/29/2008 Colonic polyps, hx of Hypertension Osteoarthritis history of lower extremity DVT January 2009 Fibroids impaired glucose tolerance  Past Surgical History: Last updated: 01/28/2009 Cholecystectomy  1998 Hysterectomy   cataract surgery os colonoscopy in January 2010  bilateral salpingo-oophorectomy  in September 2010  Social History: Last updated: 07/29/2008 cares for a disabled son 4 children, and 3 adopted children.  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    Physical Exam  General:  Well-developed,well-nourished,in no acute distress; alert,appropriate and cooperative throughout examination Neck:  No deformities, masses, or tenderness noted. Lungs:  Normal respiratory effort, chest expands symmetrically. Lungs are clear to  auscultation, no crackles or wheezes. Heart:  normal rate, regular rhythm, no murmur, and no gallop.   Extremities:  patient has some subtle increased edema right lower extremity compared with left. No specific areas of tenderness. Distal foot pulses are normal. Neurologic:  strength normal in all extremities and gait normal.     Impression & Recommendations:  Problem # 1:  LEG EDEMA (ICD-782.3) Assessment Deteriorated need to rule out recurrent DVT. Obtain venous Dopplers Her updated medication list for this problem includes:    Hydrochlorothiazide 25 Mg Tabs (Hydrochlorothiazide) .Marland Kitchen... 1 once daily  Orders: Doppler Referral (Doppler)  Complete Medication List: 1)  Klor-con M20 20 Meq Tbcr (Potassium chloride crys cr) .Marland Kitchen.. 1 once daily 2)  Hydrochlorothiazide 25 Mg Tabs (Hydrochlorothiazide) .Marland Kitchen.. 1 once daily 3)  Clobetasol Propionate E 0.05 % Crea (Clobetasol prop emollient base) .... Use as directed as needed 4)  Triamcinolone Acetonide 0.1 % Crea (Triamcinolone acetonide) .... Applied twice daily to affected area as needed

## 2010-03-30 NOTE — Progress Notes (Signed)
   Doppler Faxed to [facility] @ [phone] [facility]  September 06, 2009 11:10 AM

## 2010-03-30 NOTE — Letter (Signed)
Summary: [name]  [name]   Imported By: Maryln Gottron 10/28/2009 12:41:16  _____________________________________________________________________  External Attachment:    Type:   Image     Comment:   External Document

## 2010-06-02 LAB — COMPREHENSIVE METABOLIC PANEL
ALT: 18 U/L (ref 0–35)
AST: 19 U/L (ref 0–37)
Albumin: 3.7 g/dL (ref 3.5–5.2)
Alkaline Phosphatase: 71 U/L (ref 39–117)
BUN: 10 mg/dL (ref 6–23)
BUN: 9 mg/dL (ref 6–23)
CO2: 29 mEq/L (ref 19–32)
Calcium: 9.5 mg/dL (ref 8.4–10.5)
Creatinine, Ser: 0.53 mg/dL (ref 0.4–1.2)
GFR calc Af Amer: >60 mL/min (ref >60)
GFR calc non Af Amer: >60 mL/min (ref >60)
GFR calc non Af Amer: >60 mL/min (ref >60)
Glucose, Bld: 124 mg/dL — ABNORMAL HIGH (ref 70–99)
Potassium: 3.4 mEq/L — ABNORMAL LOW (ref 3.5–5.1)
Sodium: 139 mEq/L (ref 135–145)
Total Bilirubin: 1 mg/dL (ref 0.3–1.2)
Total Protein: 5.7 g/dL — ABNORMAL LOW (ref 6.0–8.3)

## 2010-06-02 LAB — CBC
HCT: 36.3 % (ref 36.0–46.0)
HCT: 42.1 % (ref 36.0–46.0)
Hemoglobin: 12.5 g/dL (ref 12.0–15.0)
MCHC: 33.4 g/dL (ref 30.0–36.0)
MCHC: 34.3 g/dL (ref 30.0–36.0)
MCV: 89 fL (ref 78.0–100.0)
Platelets: 342 10*3/uL (ref 150–400)
RBC: 4.07 MIL/uL (ref 3.87–5.11)
RDW: 13.6 % (ref 11.5–15.5)

## 2010-06-16 ENCOUNTER — Encounter: Payer: Self-pay | Admitting: Internal Medicine

## 2010-07-11 NOTE — Assessment & Plan Note (Signed)
OFFICE VISIT   Kristina Allison, BLUETT [facility]  DOB:  November 25, 1935                                       10/20/2009  ZOXWR#:[id number]   CHIEF COMPLAINT:  Right leg swelling with ulcer.   HISTORY OF PRESENT ILLNESS:  The patient is a 75 year old female  referred by Dr. Amador Cunas for evaluation of right leg swelling with  ulceration.  The patient has had intermittent problems with swelling in  her right leg for several years.  She previously had a right leg DVT in  January of 2009.  She states she also had a DVT several years prior to  this in the same leg.  She also complains of some arthritis type  symptoms in her right knee and was recently evaluated by orthopedics and  has a workup in progress for this.  She denies wearing prior compression  stockings for relief in her legs.  She has recently developed an  ulceration on the lateral aspect of her right leg.  She states that the  swelling is intermittent in nature but sometimes worse, especially if  she has been on her feet all day.  She denies any claudication type  symptoms.  She has no family history of hypercoagulable state.   Chronic medical problems include hypertension.  Approximately 15 pages  of medical records from [facility] were reviewed today regarding  the patient.  This revealed details regarding her previous problems with  swelling in her leg.   Past medical history is as listed above.   PAST SURGICAL HISTORY:  She had a hysterectomy, cholecystectomy and some  type of ovarian procedure.   SOCIAL HISTORY:  She is married.  She is a housewife.  She has seven  children.  She does not smoke or drink alcohol regularly.   FAMILY HISTORY:  Unremarkable.   REVIEW OF SYSTEMS:  Full 12 point review of systems was performed with  the patient today.  Please see intake referral form for details  regarding this.   MEDICATIONS:  1. Include Klor-Con.  2. Hydrochlorothiazide.  3. Clobestasol.  4.  Triamcinolone cream.  5. Tramadol.  6. Latanoprost eyedrops.  7. The patient was on Coumadin in the past for her acute DVT but no      longer requires this.   ALLERGIES:  Listed to sulfa and Bactrim.   PHYSICAL EXAM:  Vital signs:  Blood pressure is 172/88 in the left arm,  heart rate is 93 and regular, respirations 18.  HEENT:  Unremarkable.  Neck:  Has 2+ carotid pulses without bruit.  Chest:  Clear to  auscultation.  Cardiac:  Regular rate and rhythm without murmur.  Abdomen:  Soft, obese, nontender, nondistended.  No masses.  Extremities:  She has 2+ radial, femoral, dorsalis pedis and posterior  tibial pulses bilaterally.  She has no significant joint deformities.  Neurologic:  Shows symmetric upper extremity and lower extremity motor  strength which is 5/5.  Skin:  Has an erythematous 7 x 5 cm area over  the right lateral aspect of the leg with a 2 cm ulceration in the  central portion of this.  There are also scattered medial varicosities  on the right leg.  These are all fairly small in caliber between 0.5 to  1 cm in diameter.  She does have trace edema in the right lower  extremity  compared to the left.   She had a venous duplex exam performed today which showed incompetence  of the deep and superficial system on the right leg.  She also had  incompetent perforator in the right distal calf.  I reviewed and  interpreted and ordered her vascular exam study.   In summary, the patient has incompetence of the deep and superficial  system in her right leg.  This is most likely postphlebitic in nature  from the previous DVTs.  I reassured her that this is not worrisome for  a DVT currently.  I discussed with her compression therapy to try to  give her some symptomatic relief to help heal up the ulceration on her  right leg as well as decreased swelling.  A prescription was given for  compression stockings 25-30 mmHg today knee high length.  I also  discussed with her the  possibility of laser ablation of her greater  saphenous or possibly her perforating vein at some point in the future  if she wished to do this.  She has opted for conservative management  initially.  She will follow up on an as-needed basis.  Dr. Amador Cunas  will continue to monitor her ulcer.  If this continues to deteriorate  over time with compression therapy we would consider ablation at some  point in the future.     Kristina Allison. Fields, MD  Electronically Signed   CEF/MEDQ  D:  10/20/2009  T:  10/20/2009  Job:  [id number]   cc:   Gordy Savers, MD

## 2010-07-11 NOTE — Procedures (Signed)
DUPLEX DEEP VENOUS EXAM - LOWER EXTREMITY   INDICATION:  History of venous insufficiency/deep venous thrombosis.   HISTORY:  Edema:  Right leg swelling.  Trauma/Surgery:  No.  Pain:  Mild right knee/calf pain.  PE:  No.  Previous DVT:  The patient states a history of DVT in the right leg in  2009.  Anticoagulants:  Other:  History of venous insufficiency.   DUPLEX EXAM:                CFV   SFV   PopV  PTV    GSV                R  L  R  L  R  L  R   L  R  L  Thrombosis    o  o  o     o     o      +  Spontaneous   +  +  +     +     +      +  Phasic        +  +  +     +     +      +  Augmentation  +  +  +     +     +      +  Compressible  +  +  +     +     +      p  Competent     o  o  o     +            o   Legend:  + - yes  o - no  p - partial  D - decreased   IMPRESSION:  1. No evidence of deep vein thrombosis noted in the right lower      extremity.  2. Clinically significant reflux noted in the right common femoral,      superficial femoral, greater saphenous and left common femoral      veins.  3. 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  4. There is incompetent perforator noted in the right distal calf      measuring 0.4 cm.  5. Cystic structure noted in the right medial popliteal fossa.        _____________________________  [name]. Fields, MD   CH/MEDQ  D:  10/21/2009  T:  10/21/2009  Job:  [id number]

## 2010-07-11 NOTE — Consult Note (Signed)
NAME:  Kristina Allison, Kristina Allison                   ACCOUNT NO.:  [id number]   MEDICAL RECORD NO.:  [id number]          PATIENT TYPE:  OUT   LOCATION:  GYN                          FACILITY:  [facility]   PHYSICIAN:  De Blanch, M.D.DATE OF BIRTH:  [birth year]   DATE OF CONSULTATION:  05/28/2007  DATE OF DISCHARGE:                                 CONSULTATION   CHIEF COMPLAINT:  Ovarian cysts.   HISTORY OF PRESENT ILLNESS:  A 75 year old white married female seen in  consultation at the request Dr. Henderson Cloud regarding management of recently  diagnosed bilateral ovarian cysts.  The patient underwent an MRI for  evaluation of left lateral hip and groin pain, which apparently was  worse with weightbearing.  No pathology was found in the hip, although  bilateral ovarian cysts were detected.  The patient has had a follow-up  endovaginal ultrasound which showed bilateral simple ovarian cyst on the  right measuring 1.5 cm and on the left measuring 2.2 cm.  There is no  free fluid in the pelvis and no increased blood flow.   The patient has had a CA-125 measuring 3.3 units per mL and a CEA which  is normal.   The patient has no pelvic symptoms.  She stated denies any GI symptoms,  has no bloating, nausea or weight loss.   PAST MEDICAL HISTORY:   MEDICAL ILLNESSES:  1. Deep venous thrombosis, January 2009.  The etiology of this is      unknown.  The patient is currently taking Coumadin.  She does have      a past history of deep vein thrombosis.  2. Hypertension.   PAST SURGICAL HISTORY:  1. Vaginal hysterectomy.  2. Laparoscopic cholecystectomy.   OBSTETRICAL HISTORY:  Gravida 4.  The patient has also adopted three  children.  She has one child that still lives at home because he has  mental retardation.   DRUG ALLERGIES:  NONE.   FAMILY HISTORY:  Aunt with breast cancer at age 2, mother with breast  cancer.   SOCIAL HISTORY:  The patient is married.  She is retired.  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   CURRENT MEDICATIONS:  Hydrochlorothiazide, potassium chloride,  clobetasol ointment and Xalatan eye drops.   REVIEW OF SYSTEMS:  A 10-point comprehensive review of systems negative  except as noted above.   PHYSICAL EXAMINATION:  GENERAL:  Reveals a pleasant, slightly obese  white female in no acute distress.  VITAL SIGNS:  Pulse is 80, respiratory rate 20, blood pressure 130/70.  ABDOMEN:  Obese, soft, nontender.  No mass, organomegaly, ascites or  hernias noted.  All laparoscopic scars are well-healed.  PELVIC:  EGBUS, vagina, vulva urethra are normal except for a stigmata  of lichen sclerosis.  There is agglutination of the clitoral hood.  The  vagina is otherwise normal, but atrophic, well-supported.  Mild  cystocele is noted.  Bimanual exam reveals no mass, induration or  nodularity.   IMPRESSION:  Bilateral simple ovarian cysts.  I think these most likely  are benign and would,  therefore, recommend that they be followed with  serial ultrasounds.  I would suggest she have a repeat ultrasound of the  ovaries at the end of May or early in June.  We will ask that these be  done at Dr. Kittie Plater office for consistency of technique.  If she  develops any nodularity, solid areas or more complex masses, I would be  happy to see the patient back.  At this juncture, however, it appears  these are benign cysts which are asymptomatic and would recommend that  they be followed.      De Blanch, M.D.  Electronically Signed     DC/MEDQ  D:  05/28/2007  T:  05/28/2007  Job:  [id number]   cc:   Carrington Clamp, M.D.  Fax: [phone]   Telford Nab, R.N.  [address]  [place], [place] [address]   Madlyn Frankel. Charlann Boxer, M.D.  Fax: [phone]

## 2010-07-14 NOTE — Assessment & Plan Note (Signed)
[facility]                            [facility] OFFICE NOTE   Kristina Allison, Kristina Allison                          MRN:          [id number]  DATE:01/29/2006                            DOB:          [birth year]    The patient is a 75 year old female seen today for an annual exam.  She  has history of hypertension, menopausal syndrome, degenerative joint  disease.  Also colonic polyps.  She is followed by closely by  ophthalmology and GI.  Also has a gynecologist for annual exams.  She  has had a remote hysterectomy in 1977, cholecystectomy in 1998.  Doing  well today.  No concerns or complaints.  Last colonoscopy was in 2004.   PHYSICAL EXAMINATION:  GENERAL:  Mildly overweight, white female in no  acute distress.  VITAL SIGNS:  Blood pressure 130/82.  SKIN:  Negative.  HEENT:  Fundi, ear, nose, and throat clear.  NECK:  No bruits.  CHEST: Was clear.  BREASTS: Negative.  CARDIOVASCULAR:  Normal heart sounds.  No murmurs.  ABDOMEN:  Obese.  Off and on tenderness.  No organomegaly.  PELVIC:  Deferred.  EXTREMITIES:  Full peripheral pulses.  No edema.  NEUROLOGIC:  Negative.   IMPRESSION:  1. Hypertension.  2. Degenerative joint disease.  3. Menopausal syndrome.  4. Colonic polyps.   DISPOSITION:  Medical regimen unchanged.  Will reassess in six months.  Prescriptions dispensed.     Gordy Savers, MD  Electronically Signed    PFK/MedQ  DD: 01/29/2006  DT: 01/29/2006  Job #: [id number]

## 2010-07-28 ENCOUNTER — Encounter: Payer: Self-pay | Admitting: Internal Medicine

## 2010-08-01 ENCOUNTER — Encounter: Payer: Self-pay | Admitting: Internal Medicine

## 2010-08-01 ENCOUNTER — Ambulatory Visit (INDEPENDENT_AMBULATORY_CARE_PROVIDER_SITE_OTHER): Payer: Medicare Other | Admitting: Internal Medicine

## 2010-08-01 DIAGNOSIS — E669 Obesity, unspecified: Secondary | ICD-10-CM

## 2010-08-01 DIAGNOSIS — M1990 Unspecified osteoarthritis, unspecified site: Secondary | ICD-10-CM

## 2010-08-01 DIAGNOSIS — I10 Essential (primary) hypertension: Secondary | ICD-10-CM

## 2010-08-01 MED ORDER — HYDROCHLOROTHIAZIDE 25 MG PO TABS: 25.0000 mg | ORAL_TABLET | Freq: Every day | ORAL | Status: DC

## 2010-08-01 MED ORDER — POTASSIUM CHLORIDE CRYS ER 20 MEQ PO TBCR: 20.0000 meq | EXTENDED_RELEASE_TABLET | Freq: Every day | ORAL | Status: DC

## 2010-08-01 NOTE — Patient Instructions (Signed)
It is important that you exercise regularly, at least 20 minutes 3 to 4 times per week.  If you develop chest pain or shortness of breath seek  medical attention.  You need to lose weight.  Consider a lower calorie diet and regular exercise.  Please check your blood pressure on a regular basis.  If it is consistently greater than 150/90, please make an office appointment.  Return in 6 months for follow-up  Limit your sodium (Salt) intake

## 2010-08-01 NOTE — Progress Notes (Signed)
  Subjective:    Patient ID: Kristina Allison, female    DOB: Oct 11, 1935, 75 y.o.   MRN: [id number]  HPI5 year old patient who is seen today for followup. She has a history of osteoarthritis treated hypertension and exogenous obesity. She was doing quite well although under considerable situational stress. Denies any cardiopulmonary complaints;  arthritis is stable    Review of Systems  Constitutional: Negative.   HENT: Negative for hearing loss, congestion, sore throat, rhinorrhea, dental problem, sinus pressure and tinnitus.   Eyes: Negative for pain, discharge and visual disturbance.  Respiratory: Negative for cough and shortness of breath.   Cardiovascular: Negative for chest pain, palpitations and leg swelling.  Gastrointestinal: Negative for nausea, vomiting, abdominal pain, diarrhea, constipation, blood in stool and abdominal distention.  Genitourinary: Negative for dysuria, urgency, frequency, hematuria, flank pain, vaginal bleeding, vaginal discharge, difficulty urinating, vaginal pain and pelvic pain.  Musculoskeletal: Negative for joint swelling, arthralgias and gait problem.  Skin: Negative for rash.  Neurological: Negative for dizziness, syncope, speech difficulty, weakness, numbness and headaches.  Hematological: Negative for adenopathy.  Psychiatric/Behavioral: Negative for behavioral problems, dysphoric mood and agitation. The patient is not nervous/anxious.        Objective:   Physical Exam  Constitutional: She is oriented to person, place, and time. She appears well-developed and well-nourished.  HENT:  Head: Normocephalic.  Right Ear: External ear normal.  Left Ear: External ear normal.  Mouth/Throat: Oropharynx is clear and moist.  Eyes: Conjunctivae and EOM are normal. Pupils are equal, round, and reactive to light.  Neck: Normal range of motion. Neck supple. No thyromegaly present.  Cardiovascular: Normal rate, regular rhythm, normal heart sounds and intact  distal pulses.   Pulmonary/Chest: Effort normal and breath sounds normal.  Abdominal: Soft. Bowel sounds are normal. She exhibits no mass. There is no tenderness.  Musculoskeletal: Normal range of motion.  Lymphadenopathy:    She has no cervical adenopathy.  Neurological: She is alert and oriented to person, place, and time.  Skin: Skin is warm and dry. No rash noted.  Psychiatric: She has a normal mood and affect. Her behavior is normal.          Assessment & Plan:  Hypertension stable. We'll continue present regimen Obesity. Weight loss encouraged Osteoarthritis stable  We'll schedule CPX in 6 months

## 2010-08-21 ENCOUNTER — Other Ambulatory Visit: Payer: Self-pay | Admitting: Internal Medicine

## 2010-11-07 ENCOUNTER — Ambulatory Visit (INDEPENDENT_AMBULATORY_CARE_PROVIDER_SITE_OTHER): Payer: Medicare Other | Admitting: Internal Medicine

## 2010-11-07 DIAGNOSIS — Z0000 Encounter for general adult medical examination without abnormal findings: Secondary | ICD-10-CM

## 2010-11-07 DIAGNOSIS — Z23 Encounter for immunization: Secondary | ICD-10-CM

## 2011-01-31 ENCOUNTER — Encounter: Payer: Self-pay | Admitting: Internal Medicine

## 2011-01-31 ENCOUNTER — Ambulatory Visit (INDEPENDENT_AMBULATORY_CARE_PROVIDER_SITE_OTHER): Payer: Medicare Other | Admitting: Internal Medicine

## 2011-01-31 VITALS — BP 120/82 | HR 74 | Temp 98.2°F | Resp 18 | Wt 218.0 lb

## 2011-01-31 DIAGNOSIS — Z0000 Encounter for general adult medical examination without abnormal findings: Secondary | ICD-10-CM

## 2011-01-31 DIAGNOSIS — I10 Essential (primary) hypertension: Secondary | ICD-10-CM

## 2011-01-31 DIAGNOSIS — M1990 Unspecified osteoarthritis, unspecified site: Secondary | ICD-10-CM

## 2011-01-31 DIAGNOSIS — Z86010 Personal history of colonic polyps: Secondary | ICD-10-CM

## 2011-01-31 DIAGNOSIS — I82409 Acute embolism and thrombosis of unspecified deep veins of unspecified lower extremity: Secondary | ICD-10-CM

## 2011-01-31 DIAGNOSIS — E669 Obesity, unspecified: Secondary | ICD-10-CM

## 2011-01-31 LAB — CBC WITH DIFFERENTIAL/PLATELET
Basophils Relative: 0.7 % (ref 0.0–3.0)
Eosinophils Relative: 3.6 % (ref 0.0–5.0)
Lymphocytes Relative: 29.5 % (ref 12.0–46.0)
MCV: 90.8 fl (ref 78.0–100.0)
Monocytes Absolute: 0.6 10*3/uL (ref 0.1–1.0)
Neutrophils Relative %: 60.1 % (ref 43.0–77.0)
Platelets: 331 10*3/uL (ref 150.0–400.0)
RBC: 4.59 Mil/uL (ref 3.87–5.11)
WBC: 9.8 10*3/uL (ref 4.5–10.5)

## 2011-01-31 LAB — TSH: TSH: 1.14 u[IU]/mL (ref 0.35–5.50)

## 2011-01-31 LAB — COMPREHENSIVE METABOLIC PANEL
Albumin: 3.9 g/dL (ref 3.5–5.2)
BUN: 20 mg/dL (ref 6–23)
CO2: 30 mEq/L (ref 19–32)
Calcium: 9.1 mg/dL (ref 8.4–10.5)
Chloride: 104 mEq/L (ref 96–112)
GFR: 105.93 mL/min (ref >60.00)
Glucose, Bld: 101 mg/dL — ABNORMAL HIGH (ref 70–99)
Potassium: 3.7 mEq/L (ref 3.5–5.1)
Sodium: 141 mEq/L (ref 135–145)
Total Protein: 7.4 g/dL (ref 6.0–8.3)

## 2011-01-31 LAB — LIPID PANEL
Cholesterol: 159 mg/dL (ref 0–200)
VLDL: 14 mg/dL (ref 0.0–40.0)

## 2011-01-31 MED ORDER — POTASSIUM CHLORIDE CRYS ER 20 MEQ PO TBCR: 20.0000 meq | EXTENDED_RELEASE_TABLET | Freq: Every day | ORAL | Status: DC

## 2011-01-31 MED ORDER — HYDROCHLOROTHIAZIDE 25 MG PO TABS: 25.0000 mg | ORAL_TABLET | Freq: Every day | ORAL | Status: DC

## 2011-01-31 NOTE — Patient Instructions (Signed)
Limit your sodium (Salt) intake    It is important that you exercise regularly, at least 20 minutes 3 to 4 times per week.  If you develop chest pain or shortness of breath seek  medical attention.  Please check your blood pressure on a regular basis.  If it is consistently greater than 150/90, please make an office appointment.  You need to lose weight.  Consider a lower calorie diet and regular exercise.  Return in 6 months for follow-up  

## 2011-01-31 NOTE — Progress Notes (Signed)
Subjective:    Patient ID: Kristina Allison, female    DOB: 09/11/1935, 75 y.o.   MRN: [id number]  HPICC: cpx - doing ok.   History of Present Illness:   75 year-old patient who is seen today for a wellness exam. Medical problems include a history of chronic venous insufficiency and osteoarthritis, involving the right knee. She is followed by orthopedics. She has hypertension. History colonic polyps and exogenous obesity.   Here for Medicare AWV:  1. Risk factors based on Past M, S, F history: Cardiovascular - risk factors include a history of hypertension.  2. Physical Activities: fairly active and cares for a number of children and grandchildren  3. Depression/mood: history of depression or mood disorder, although considerable situational stress  4. Hearing: no hearing impairment  5. ADL's: independent in all aspects of daily living  6. Fall Risk: low  7. Home Safety: no problems identified  8. Height, weight, &visual acuity:height and weight are stable. No change in visual acuity  9. Counseling: heart healthy diet modest weight loss encouraged  10. Labs ordered based on risk factors: laboratory profile, including lipid panel and TSH will be reviewed  11. Referral Coordination- GYN referral is scheduled later this month  12. Care Plan- heart healthy diet, weight loss encouraged  13. Cognitive Assessment- alert and oriented, with normal affect. History of memory dysfunction and also executive functioning without difficulty   Preventive Screening-Counseling & Management  Alcohol-Tobacco  Smoking Status: never   Allergies:  1) ! Sulf-10  2) ! Bactrim Ds (Sulfamethoxazole-Trimethoprim)   Past History:  Past Medical History:  Reviewed history from 09/06/2009 and no changes required.  Colonic polyps, hx of  Hypertension  Osteoarthritis  history of lower extremity DVT January 2009  Fibroids  impaired glucose tolerance  chronic venous insufficiency   Past Surgical History:    Reviewed history from 01/28/2009 and no changes required.  Cholecystectomy 1998  Hysterectomy  cataract surgery os  colonoscopy in January 2010  bilateral salpingo-oophorectomy in September 2010   Family History:  Reviewed history from 06/17/2007 and no changes required.  age 45, history of Parkinson's disease, senile dementia  mother has senile dementia presently 51  one brother in good health  one sister died of cardiac complications at age 33, history of EtOH   Social History:  Reviewed history from 07/29/2008 and no changes required.  cares for a disabled son  4 children, and 3 adopted children. 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  HENT: Negative for hearing loss, ear pain, nosebleeds, congestion, sore throat, mouth sores, trouble swallowing, neck stiffness, dental problem, voice change, sinus pressure and tinnitus.   Eyes: Negative for photophobia, pain, redness and visual disturbance.  Respiratory: Negative for cough, chest tightness and shortness of breath.   Cardiovascular: Negative for chest pain, palpitations and leg swelling.  Gastrointestinal: Negative for nausea, vomiting, abdominal pain, diarrhea, constipation, blood in stool, abdominal distention and rectal pain.  Genitourinary: Negative for dysuria, urgency, frequency, hematuria, flank pain, vaginal bleeding, vaginal discharge, difficulty urinating, genital sores, vaginal pain, menstrual problem and pelvic pain.  Musculoskeletal: Positive for gait problem. Negative for back pain and arthralgias (significant right knee pain. Wears a brace).  Skin: Negative for rash.  Neurological: Negative  for dizziness, syncope, speech difficulty, weakness, light-headedness, numbness and  headaches.  Hematological: Negative for adenopathy. Does not bruise/bleed easily.  Psychiatric/Behavioral: Negative for suicidal ideas, behavioral problems, self-injury, dysphoric mood and agitation. The patient is not nervous/anxious.        Objective:   Physical Exam  Constitutional: She is oriented to person, place, and time. She appears well-developed and well-nourished.       Weight 218  HENT:  Head: Normocephalic and atraumatic.  Right Ear: External ear normal.  Left Ear: External ear normal.  Mouth/Throat: Oropharynx is clear and moist.  Eyes: Conjunctivae and EOM are normal.  Neck: Normal range of motion. Neck supple. No JVD present. No thyromegaly present.  Cardiovascular: Normal rate, regular rhythm, normal heart sounds and intact distal pulses.   No murmur heard. Pulmonary/Chest: Effort normal and breath sounds normal. She has no wheezes. She has no rales.  Abdominal: Soft. Bowel sounds are normal. She exhibits no distension and no mass. There is no tenderness. There is no rebound and no guarding.  Genitourinary: Vagina normal.  Musculoskeletal: Normal range of motion. She exhibits no edema and no tenderness.       Brace right knee  Neurological: She is alert and oriented to person, place, and time. She has normal reflexes. No cranial nerve deficit. She exhibits normal muscle tone. Coordination normal.  Skin: Skin is warm and dry. No rash noted.  Psychiatric: She has a normal mood and affect. Her behavior is normal.          Assessment & Plan:   Preventive health exam Hypertension stable Osteoarthritis Exogenous obesity Colonic polyps.  redo colonoscopy in 3 years  Low salt diet more regular exercise weight loss all encouraged. We'll reassess in 6 months. That regimen unchanged

## 2011-06-18 LAB — HM MAMMOGRAPHY

## 2011-06-19 ENCOUNTER — Encounter: Payer: Self-pay | Admitting: Internal Medicine

## 2011-08-03 ENCOUNTER — Encounter: Payer: Self-pay | Admitting: Family Medicine

## 2011-08-03 ENCOUNTER — Ambulatory Visit (INDEPENDENT_AMBULATORY_CARE_PROVIDER_SITE_OTHER): Payer: Medicare Other | Admitting: Family Medicine

## 2011-08-03 VITALS — BP 140/84 | Temp 98.1°F | Wt 218.0 lb

## 2011-08-03 DIAGNOSIS — R1011 Right upper quadrant pain: Secondary | ICD-10-CM

## 2011-08-03 NOTE — Patient Instructions (Signed)
Try Advil for discomfort. Follow up immediately for any shortness of breath or progressive pain. Followup with primary physician in one to 2 weeks if pain is not resolving and sooner as needed.

## 2011-08-03 NOTE — Progress Notes (Signed)
  Subjective:    Patient ID: Kristina Allison, female    DOB: 08-28-1935, 76 y.o.   MRN: [id number]  HPI  Acute visit. Patient presents with ten-day history of relatively constant pain under right breast. Previous cholecystectomy. Pain is more of a dull ache. Mild intensity. No clear exacerbating factors. No alleviating factors.  No radiation. She denies any skin rash, fever, chills, dyspnea, or any cough. Took Zantac without relief. Pain intensity is 2/10. No recent injury. Not worse with movement. Previous episode of pleurisy and slightly similar symptoms now. Minimal pain with deep breathing. No hemoptysis.  Colonoscopy 2010 mild diverticulosis.  Past Medical History  Diagnosis Date  . COLONIC POLYPS, HX OF 11/28/2006  . DVT 03/10/2007  . Edema 02/21/2007  . ESOPHAGITIS NOS 09/27/2006  . GOUT 06/04/2007  . HIP PAIN, LEFT 01/30/2007  . HYPERTENSION 11/28/2006  . OBESITY NOS 09/27/2006  . OSTEOARTHRITIS 11/28/2006  . OSTEOARTHROSIS, GENERALIZED, UNSPC SITE 09/27/2006  . PHLEBITIS&THROMBOPHLEBITIS LOWER EXTREM UNSPEC 03/07/2007  . STATE, SYMPTOMATIC MENOPAUSE/FEM CLIMACTERIC 09/27/2006  . VENOUS INSUFFICIENCY, CHRONIC, RIGHT LEG 09/06/2009   No past surgical history on file.  reports that she has never smoked. She has never used smokeless tobacco. She reports that she does not drink alcohol or use illicit drugs. family history is not on file. Allergies  Allergen Reactions  . Sulfacetamide Sodium   . Sulfamethoxazole W-Trimethoprim      Review of Systems  Constitutional: Negative for fever, chills and unexpected weight change.  HENT: Negative for trouble swallowing.   Respiratory: Negative for cough, shortness of breath and wheezing.   Cardiovascular: Negative for palpitations and leg swelling.  Gastrointestinal: Negative for nausea, vomiting and abdominal pain.  Skin: Negative for rash.  Neurological: Negative for dizziness and syncope.  Hematological: Negative for adenopathy.         Objective:   Physical Exam  Constitutional: She appears well-developed and well-nourished.  Neck: Neck supple. No thyromegaly present.  Cardiovascular: Normal rate and regular rhythm.   Pulmonary/Chest: Effort normal and breath sounds normal. No respiratory distress. She has no wheezes. She has no rales.  Abdominal: Soft. Bowel sounds are normal. She exhibits no distension and no mass. There is no tenderness. There is no rebound and no guarding.       No hepatomegaly  Musculoskeletal:       Support garments in place  Lymphadenopathy:    She has no cervical adenopathy.          Assessment & Plan:  Mild RUQ abd/chest wall pain-?musculoskeletal.  Recommend try advil and follow up with primary if no better by next week.

## 2011-08-09 ENCOUNTER — Encounter: Payer: Self-pay | Admitting: Internal Medicine

## 2011-08-09 ENCOUNTER — Ambulatory Visit (INDEPENDENT_AMBULATORY_CARE_PROVIDER_SITE_OTHER): Payer: Medicare Other | Admitting: Internal Medicine

## 2011-08-09 VITALS — BP 122/70 | Temp 98.1°F | Wt 219.0 lb

## 2011-08-09 DIAGNOSIS — R079 Chest pain, unspecified: Secondary | ICD-10-CM

## 2011-08-09 DIAGNOSIS — I10 Essential (primary) hypertension: Secondary | ICD-10-CM

## 2011-08-09 DIAGNOSIS — M1990 Unspecified osteoarthritis, unspecified site: Secondary | ICD-10-CM

## 2011-08-09 MED ORDER — MELOXICAM 15 MG PO TABS: 15.0000 mg | ORAL_TABLET | Freq: Every day | ORAL | Status: DC

## 2011-08-09 NOTE — Progress Notes (Signed)
  Subjective:    Patient ID: Kristina Allison, female    DOB: August 24, 1935, 76 y.o.   MRN: [id number]  HPI  76 year old patient who is seen today in followup. She was seen 6 days ago with a seven-day history of right anterior chest pain. Pain is described as very mild and more of a nuisance. She describes a sense of the discomfort an annoyance but no significant pain no real aggravating factors. Pain is not aggravated by movement or deep inspiration. She has no pulmonary complaints such as shortness of breath. Denies any cough. She does have remote history of lower extremity DVT. She has had a remote cholecystectomy. She was seen 6 days ago and has been using Aleve sporadically.    Review of Systems  Constitutional: Negative.   HENT: Negative for hearing loss, congestion, sore throat, rhinorrhea, dental problem, sinus pressure and tinnitus.   Eyes: Negative for pain, discharge and visual disturbance.  Respiratory: Negative for cough and shortness of breath.   Cardiovascular: Positive for chest pain. Negative for palpitations and leg swelling.  Gastrointestinal: Negative for nausea, vomiting, abdominal pain, diarrhea, constipation, blood in stool and abdominal distention.  Genitourinary: Negative for dysuria, urgency, frequency, hematuria, flank pain, vaginal bleeding, vaginal discharge, difficulty urinating, vaginal pain and pelvic pain.  Musculoskeletal: Negative for joint swelling, arthralgias and gait problem.  Skin: Negative for rash.  Neurological: Negative for dizziness, syncope, speech difficulty, weakness, numbness and headaches.  Hematological: Negative for adenopathy.  Psychiatric/Behavioral: Negative for behavioral problems, dysphoric mood and agitation. The patient is not nervous/anxious.        Objective:   Physical Exam  Constitutional: She is oriented to person, place, and time. She appears well-developed and well-nourished.  HENT:  Head: Normocephalic.  Right Ear: External  ear normal.  Left Ear: External ear normal.  Mouth/Throat: Oropharynx is clear and moist.  Eyes: Conjunctivae and EOM are normal. Pupils are equal, round, and reactive to light.  Neck: Normal range of motion. Neck supple. No thyromegaly present.  Cardiovascular: Normal rate, regular rhythm, normal heart sounds and intact distal pulses.   Pulmonary/Chest: Effort normal and breath sounds normal.       Pain was located of the right anterior costal chondral junction. Gentle pressure did not seem to aggravate the pain. Chest was clear. O2 saturation was normal. There is no tachycardia or tachypnea  Abdominal: Soft. Bowel sounds are normal. She exhibits no mass. There is no tenderness.  Musculoskeletal: Normal range of motion.  Lymphadenopathy:    She has no cervical adenopathy.  Neurological: She is alert and oriented to person, place, and time.  Skin: Skin is warm and dry. No rash noted.  Psychiatric: She has a normal mood and affect. Her behavior is normal.          Assessment & Plan:   Chest wall pain. Appears to be mild costochondritis. This was discussed with the patient at length. If she fails to improve or worsens may need further testing but presently will treat with Mobic 15 mg daily for a few weeks. She'll call if unimproved

## 2011-08-09 NOTE — Patient Instructions (Addendum)
  Call or return to clinic prn if these symptoms worsen or fail to improve as anticipated. Costochondritis Costochondritis (Tietze syndrome), or costochondral separation, is a swelling and irritation (inflammation) of the tissue (cartilage) that connects your ribs with your breastbone (sternum). It may occur on its own (spontaneously), through damage caused by an accident (trauma), or simply from coughing or minor exercise. It may take up to 6 weeks to get better and longer if you are unable to be conservative in your activities. HOME CARE INSTRUCTIONS    Avoid exhausting physical activity. Try not to strain your ribs during normal activity. This would include any activities using chest, belly (abdominal), and side muscles, especially if heavy weights are used.   Use ice for 15 to 20 minutes per hour while awake for the first 2 days. Place the ice in a plastic bag, and place a towel between the bag of ice and your skin.   Only take over-the-counter or prescription medicines for pain, discomfort, or fever as directed by your caregiver.  SEEK IMMEDIATE MEDICAL CARE IF:    Your pain increases or you are very uncomfortable.   You have a fever.   You develop difficulty with your breathing.   You cough up blood.   You develop worse chest pains, shortness of breath, sweating, or vomiting.   You develop new, unexplained problems (symptoms).  MAKE SURE YOU:    Understand these instructions.   Will watch your condition.   Will get help right away if you are not doing well or get worse.  Document Released: 11/22/2004 Document Revised: 02/01/2011 Document Reviewed: 10/01/2007 [facility] Patient Information 2012 [place], [place].

## 2011-09-20 ENCOUNTER — Other Ambulatory Visit: Payer: Self-pay | Admitting: Internal Medicine

## 2011-11-07 ENCOUNTER — Encounter: Payer: Self-pay | Admitting: Internal Medicine

## 2011-11-07 ENCOUNTER — Ambulatory Visit (INDEPENDENT_AMBULATORY_CARE_PROVIDER_SITE_OTHER): Payer: Medicare Other | Admitting: Internal Medicine

## 2011-11-07 VITALS — BP 128/90 | Temp 98.2°F | Wt 215.0 lb

## 2011-11-07 DIAGNOSIS — M79609 Pain in unspecified limb: Secondary | ICD-10-CM

## 2011-11-07 DIAGNOSIS — I10 Essential (primary) hypertension: Secondary | ICD-10-CM

## 2011-11-07 DIAGNOSIS — M1990 Unspecified osteoarthritis, unspecified site: Secondary | ICD-10-CM

## 2011-11-07 DIAGNOSIS — M79601 Pain in right arm: Secondary | ICD-10-CM

## 2011-11-07 MED ORDER — HYDROCODONE-ACETAMINOPHEN 5-500 MG PO TABS: 1.0000 | ORAL_TABLET | Freq: Three times a day (TID) | ORAL | Status: DC | PRN

## 2011-11-07 MED ORDER — METHYLPREDNISOLONE ACETATE 80 MG/ML IJ SUSP
80.0000 mg | Freq: Once | INTRAMUSCULAR | Status: AC
  Administered 2011-11-07: 80 mg via INTRAMUSCULAR

## 2011-11-07 MED ORDER — MELOXICAM 15 MG PO TABS: 15.0000 mg | ORAL_TABLET | Freq: Every day | ORAL | Status: DC

## 2011-11-07 NOTE — Patient Instructions (Signed)
Mobic one daily Take pain medication as directed  Call or return to clinic prn if these symptoms worsen or fail to improve as anticipated.

## 2011-11-07 NOTE — Progress Notes (Signed)
  Subjective:    Patient ID: Kristina Allison, female    DOB: April 30, 1935, 76 y.o.   MRN: [id number]  HPI  76 year old patient who has a history of treated hypertension and osteoarthritis. She presents with a one-week history of pain involving the right arm. Pain is maximal.the right elbow pain is not aggravated by movement of the head neck shoulder or elbow region. She does describe pain that radiates down to her right fifth finger. Denies any weakness involving the arm. No unusual activity or trauma. Pain is not aggravated by use the right arm such as lift and a half gallon of milk.  Past Medical History  Diagnosis Date  . COLONIC POLYPS, HX OF 11/28/2006  . DVT 03/10/2007  . Edema 02/21/2007  . ESOPHAGITIS NOS 09/27/2006  . GOUT 06/04/2007  . HIP PAIN, LEFT 01/30/2007  . HYPERTENSION 11/28/2006  . OBESITY NOS 09/27/2006  . OSTEOARTHRITIS 11/28/2006  . OSTEOARTHROSIS, GENERALIZED, UNSPC SITE 09/27/2006  . PHLEBITIS&THROMBOPHLEBITIS LOWER EXTREM UNSPEC 03/07/2007  . STATE, SYMPTOMATIC MENOPAUSE/FEM CLIMACTERIC 09/27/2006  . VENOUS INSUFFICIENCY, CHRONIC, RIGHT LEG 09/06/2009    History   Social History  . Marital Status: Married    Spouse Name: N/A    Number of Children: N/A  . Years of Education: N/A   Occupational History  . Not on file.   Social History Main Topics  . Smoking status: Never Smoker   . Smokeless tobacco: Never Used  . Alcohol Use: No  . Drug Use: No  . Sexually Active: Not on file   Other Topics Concern  . Not on file   Social History Narrative  . No narrative on file    No past surgical history on file.  No family history on file.  Allergies  Allergen Reactions  . Sulfacetamide Sodium   . Sulfamethoxazole W-Trimethoprim     Current Outpatient Prescriptions on File Prior to Visit  Medication Sig Dispense Refill  . clobetasol (TEMOVATE) 0.05 % cream Apply topically 2 (two) times daily.        . hydrochlorothiazide (HYDRODIURIL) 25 MG tablet Take 1 tablet (25  mg total) by mouth daily.  90 tablet  6  . hydrochlorothiazide (HYDRODIURIL) 25 MG tablet TAKE 1 TABLET BY MOUTH EVERY DAY  90 tablet  3  . potassium chloride SA (K-DUR,KLOR-CON) 20 MEQ tablet Take 1 tablet (20 mEq total) by mouth daily.  90 tablet  6  . meloxicam (MOBIC) 15 MG tablet Take 1 tablet (15 mg total) by mouth daily.  30 tablet  2    BP 128/90  Temp 98.2 F (36.8 C) (Oral)  Wt 215 lb (97.523 kg)      Review of Systems     Objective:   Physical Exam  Musculoskeletal:       Range of motion of the head to the extreme right did tend to cause some mild discomfort in the right elbow area Biceps and triceps reflexes were somewhat depressed but equal Normal grip strength          Assessment & Plan:   Right arm pain. Suspect cervical radiculopathy. Will treat with Depo-Medrol. Will resume Mobic and give a prescription for hydrocodone. Depending on her clinical response we'll consider imaging studies later Hypertension stable Osteoarthritis

## 2011-11-12 ENCOUNTER — Encounter: Payer: Self-pay | Admitting: Internal Medicine

## 2011-11-12 ENCOUNTER — Telehealth: Payer: Self-pay | Admitting: Internal Medicine

## 2011-11-12 ENCOUNTER — Ambulatory Visit (INDEPENDENT_AMBULATORY_CARE_PROVIDER_SITE_OTHER): Payer: Medicare Other | Admitting: Internal Medicine

## 2011-11-12 VITALS — BP 140/90 | Temp 98.2°F

## 2011-11-12 DIAGNOSIS — M5412 Radiculopathy, cervical region: Secondary | ICD-10-CM

## 2011-11-12 MED ORDER — HYDROCODONE-ACETAMINOPHEN 5-500 MG PO TABS: 1.0000 | ORAL_TABLET | Freq: Four times a day (QID) | ORAL | Status: AC | PRN

## 2011-11-12 NOTE — Telephone Encounter (Signed)
Caller: Emyah/Patient; Patient Name: Kristina Allison; PCP: Eleonore Chiquito [facility]); Best Callback Phone Number: [phone]; Reason for call:  Was seen 11/05/11 for pain in her right arm and got shot in right shoulder and 2 PO medicaitons and they are not working. She has taken the Meloxicam and Hydrocodone 5/500mg  1 PO this AM.  Right  arm pain  started 2 weeks ago.   No injury.  Triaged Arm Non-Injury and all emrgent symptoms ruled out.  Needs to be seen in 24 hours for Severe pain with movement that limits normal activitiy.  No appointments with Dr. Renne Crigler.  Home care and call back instrucitons given.  Please call patient to advise on an appointment time.

## 2011-11-12 NOTE — Telephone Encounter (Signed)
Please schedule 330p or 345p opening today

## 2011-11-12 NOTE — Telephone Encounter (Signed)
Pt is sch for today 345pm

## 2011-11-12 NOTE — Telephone Encounter (Signed)
No answer at [phone] after several ring vm came on to enter remote code

## 2011-11-12 NOTE — Telephone Encounter (Signed)
Pt answer at 12:45pm

## 2011-11-12 NOTE — Patient Instructions (Addendum)
Use a soft cervical collar Continue pain medications and anti-inflammatories as discussed If unimproved please call the office to schedule a cervical MRI

## 2011-11-12 NOTE — Progress Notes (Signed)
  Subjective:    Patient ID: Kristina Allison, female    DOB: 1935/12/26, 76 y.o.   MRN: [id number]  HPI  76 year old patient who is seen today for followup of a suspected right C8 cervical radiculopathy. She was seen one week ago and treated with a Depo-Medrol IM injection. She is unimproved. She continues to have pain involving the right arm which is maximal about the right elbow. Pain radiates down the right lower arm and affects the fifth finger with mild involvement of the left fourth finger. No motor weakness. She is on analgesics and anti-inflammatories    Review of Systems  Musculoskeletal: Positive for arthralgias.  Neurological: Positive for numbness.       Objective:   Physical Exam  Constitutional: She is oriented to person, place, and time.  Neck:       Head turning to the right aggravates right arm pain  Neurological: She is oriented to person, place, and time. She has normal reflexes.       Triceps reflexes remain normal triceps reflexes are depressed but symmetrical Grip strength  Arm extension and flexion normal          Assessment & Plan:   Suspected C8 cervical radiculopathy. Options were discussed. The patient will try a soft cervical collar continue analgesics and anti-inflammatory. If unimproved later this week we'll call for a cervical MRI and referral

## 2011-11-20 ENCOUNTER — Telehealth: Payer: Self-pay | Admitting: Internal Medicine

## 2011-11-20 DIAGNOSIS — M79601 Pain in right arm: Secondary | ICD-10-CM

## 2011-11-20 NOTE — Telephone Encounter (Signed)
Please advise 

## 2011-11-20 NOTE — Telephone Encounter (Signed)
Please schedule cervical MRI 

## 2011-11-20 NOTE — Telephone Encounter (Signed)
Order done

## 2011-11-20 NOTE — Telephone Encounter (Signed)
Pt was seen on 11-12-2011 for arm pain. Pt stated MD said him would referl her for MRI if pain persist.

## 2011-12-07 ENCOUNTER — Ambulatory Visit
Admission: RE | Admit: 2011-12-07 | Discharge: 2011-12-07 | Disposition: A | Discharge status: Home / Self Care | Payer: Medicare Other | Source: Ambulatory Visit | Attending: Internal Medicine | Admitting: Internal Medicine

## 2011-12-07 ENCOUNTER — Other Ambulatory Visit: Payer: Self-pay | Admitting: Internal Medicine

## 2011-12-07 DIAGNOSIS — R202 Paresthesia of skin: Secondary | ICD-10-CM

## 2011-12-07 DIAGNOSIS — R9389 Abnormal findings on diagnostic imaging of other specified body structures: Secondary | ICD-10-CM

## 2011-12-07 DIAGNOSIS — M79601 Pain in right arm: Secondary | ICD-10-CM

## 2011-12-07 NOTE — Progress Notes (Signed)
Quick Note:  Spoke with pt- informed of results and dr. Vernon Prey instructions for referral - order done ______

## 2011-12-07 NOTE — Progress Notes (Signed)
Quick Note:  Spoke with husband - pt not home at this time - he asked me to call back later to discuss ______

## 2011-12-18 ENCOUNTER — Other Ambulatory Visit: Payer: Self-pay | Admitting: Neurosurgery

## 2011-12-18 DIAGNOSIS — M792 Neuralgia and neuritis, unspecified: Secondary | ICD-10-CM

## 2011-12-19 ENCOUNTER — Ambulatory Visit
Admission: RE | Admit: 2011-12-19 | Discharge: 2011-12-19 | Disposition: A | Discharge status: Home / Self Care | Payer: Medicare Other | Source: Ambulatory Visit | Attending: Neurosurgery | Admitting: Neurosurgery

## 2011-12-19 VITALS — BP 141/68 | HR 81

## 2011-12-19 DIAGNOSIS — M792 Neuralgia and neuritis, unspecified: Secondary | ICD-10-CM

## 2011-12-19 MED ORDER — IOHEXOL 300 MG/ML  SOLN
9.0000 mL | Freq: Once | INTRAMUSCULAR | Status: AC | PRN
  Administered 2011-12-19: 9 mL via INTRATHECAL

## 2011-12-19 MED ORDER — DIAZEPAM 5 MG PO TABS
5.0000 mg | ORAL_TABLET | Freq: Once | ORAL | Status: AC
  Administered 2011-12-19: 5 mg via ORAL

## 2012-02-01 ENCOUNTER — Encounter: Payer: Self-pay | Admitting: Internal Medicine

## 2012-02-01 ENCOUNTER — Ambulatory Visit (INDEPENDENT_AMBULATORY_CARE_PROVIDER_SITE_OTHER): Payer: Medicare Other | Admitting: Internal Medicine

## 2012-02-01 VITALS — BP 140/72 | HR 95 | Temp 97.9°F | Resp 20 | Ht 65.0 in | Wt 211.0 lb

## 2012-02-01 DIAGNOSIS — Z0000 Encounter for general adult medical examination without abnormal findings: Secondary | ICD-10-CM

## 2012-02-01 DIAGNOSIS — E669 Obesity, unspecified: Secondary | ICD-10-CM

## 2012-02-01 DIAGNOSIS — M1990 Unspecified osteoarthritis, unspecified site: Secondary | ICD-10-CM

## 2012-02-01 DIAGNOSIS — I10 Essential (primary) hypertension: Secondary | ICD-10-CM

## 2012-02-01 DIAGNOSIS — I872 Venous insufficiency (chronic) (peripheral): Secondary | ICD-10-CM

## 2012-02-01 LAB — LIPID PANEL
Cholesterol: 145 mg/dL (ref 0–200)
VLDL: 19.6 mg/dL (ref 0.0–40.0)

## 2012-02-01 LAB — COMPREHENSIVE METABOLIC PANEL
Albumin: 3.6 g/dL (ref 3.5–5.2)
BUN: 13 mg/dL (ref 6–23)
Calcium: 9.3 mg/dL (ref 8.4–10.5)
Chloride: 103 mEq/L (ref 96–112)
GFR: 109.93 mL/min (ref >60.00)
Glucose, Bld: 107 mg/dL — ABNORMAL HIGH (ref 70–99)
Potassium: 3.7 mEq/L (ref 3.5–5.1)

## 2012-02-01 LAB — CBC WITH DIFFERENTIAL/PLATELET
Basophils Relative: 0.8 % (ref 0.0–3.0)
Eosinophils Relative: 4.6 % (ref 0.0–5.0)
Lymphocytes Relative: 33.3 % (ref 12.0–46.0)
Neutrophils Relative %: 53.5 % (ref 43.0–77.0)
RBC: 4.5 Mil/uL (ref 3.87–5.11)
WBC: 7 10*3/uL (ref 4.5–10.5)

## 2012-02-01 LAB — TSH: TSH: 0.8 u[IU]/mL (ref 0.35–5.50)

## 2012-02-01 MED ORDER — MELOXICAM 15 MG PO TABS: 15.0000 mg | ORAL_TABLET | Freq: Every day | ORAL | Status: DC

## 2012-02-01 MED ORDER — HYDROCHLOROTHIAZIDE 25 MG PO TABS: 25.0000 mg | ORAL_TABLET | Freq: Every day | ORAL | Status: DC

## 2012-02-01 MED ORDER — NEOMYCIN-POLYMYXIN-HC 3.5-10000-1 OT SOLN: 3.0000 [drp] | Freq: Four times a day (QID) | OTIC | Status: DC

## 2012-02-01 MED ORDER — POTASSIUM CHLORIDE CRYS ER 20 MEQ PO TBCR: 20.0000 meq | EXTENDED_RELEASE_TABLET | Freq: Every day | ORAL | Status: DC

## 2012-02-01 NOTE — Patient Instructions (Signed)

## 2012-02-01 NOTE — Progress Notes (Signed)
Patient ID: Kristina Allison, female   DOB: [birth year], 76 y.o.   MRN: [id number]  Subjective:    Patient ID: Kristina Allison, female    DOB: Feb 26, 1936, 76 y.o.   MRN: [id number]  Hypertension Pertinent negatives include no chest pain, headaches, palpitations or shortness of breath.  CC: cpx - doing ok.   History of Present Illness:   76 year-old patient who is seen today for a wellness exam. Medical problems include a history of chronic venous insufficiency and osteoarthritis, involving the right knee. She is followed by orthopedics. She has hypertension. History colonic polyps and exogenous obesity. Earlier she was evaluated for a suspected right C8 radiculopathy. Symptoms have since resolved.  Imaging studies failed to reveal a significant anatomical defect although she did have fairly diffuse osteoarthritic changes  She is evaluated by GYN annually and does obtain annual mammogram  Here for Medicare AWV:  1. Risk factors based on Past M, S, F history: Cardiovascular - risk factors include a history of hypertension.  2. Physical Activities: fairly active and cares for a number of children and grandchildren  3. Depression/mood: history of depression or mood disorder, although considerable situational stress  4. Hearing: no hearing impairment;  describe some vertigo and occasional draining from the left ear 5. ADL's: independent in all aspects of daily living  6. Fall Risk: low  7. Home Safety: no problems identified  8. Height, weight, &visual acuity:height and weight are stable. No change in visual acuity  9. Counseling: heart healthy diet modest weight loss encouraged  10. Labs ordered based on risk factors: laboratory profile, including lipid panel and TSH will be reviewed  11. Referral Coordination- GYN referral is scheduled later this month  12. Care Plan- heart healthy diet, weight loss encouraged  13. Cognitive Assessment- alert and oriented, with normal affect. History of memory  dysfunction and also executive functioning without difficulty   Preventive Screening-Counseling & Management  Alcohol-Tobacco  Smoking Status: never   Allergies:  1) ! Sulf-10  2) ! Bactrim Ds (Sulfamethoxazole-Trimethoprim)   Past History:  Past Medical History:  Reviewed history from 09/06/2009 and no changes required.  Colonic polyps, hx of  Hypertension  Osteoarthritis  history of lower extremity DVT January 2009  Fibroids  impaired glucose tolerance  chronic venous insufficiency   Past Surgical History:  Reviewed history from 01/28/2009 and no changes required.  Cholecystectomy 1998  Hysterectomy  cataract surgery os  colonoscopy in January 2010  bilateral salpingo-oophorectomy in September 2010   Family History:  Reviewed history from 06/17/2007 and no changes required.  age 81, history of Parkinson's disease, senile dementia  mother has senile dementia presently 55  one brother in good health  one sister died of cardiac complications at age 75, history of EtOH   Social History:  Reviewed history from 07/29/2008 and no changes required.  cares for a disabled son  4 children, and 3 adopted children. 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  HENT: Negative for hearing loss, ear pain, nosebleeds, congestion, sore throat, mouth sores, trouble swallowing, neck stiffness, dental problem, voice change, sinus pressure and tinnitus.   Eyes: Negative for photophobia, pain, redness and visual disturbance.  Respiratory: Negative for cough, chest tightness and shortness  of breath.   Cardiovascular: Negative for chest pain, palpitations and leg swelling.  Gastrointestinal: Negative for nausea, vomiting, abdominal pain, diarrhea,  constipation, blood in stool, abdominal distention and rectal pain.  Genitourinary: Negative for dysuria, urgency, frequency, hematuria, flank pain, vaginal bleeding, vaginal discharge, difficulty urinating, genital sores, vaginal pain, menstrual problem and pelvic pain.  Musculoskeletal: Positive for gait problem. Negative for back pain and arthralgias (significant right knee pain. Wears a brace).  Skin: Negative for rash.  Neurological: Negative for dizziness, syncope, speech difficulty, weakness, light-headedness, numbness and headaches.  Hematological: Negative for adenopathy. Does not bruise/bleed easily.  Psychiatric/Behavioral: Negative for suicidal ideas, behavioral problems, self-injury, dysphoric mood and agitation. The patient is not nervous/anxious.        Objective:   Physical Exam  Constitutional: She is oriented to person, place, and time. She appears well-developed and well-nourished.       Weight 218  HENT:  Head: Normocephalic and atraumatic.  Right Ear: External ear normal.  Left Ear: External ear normal.  Mouth/Throat: Oropharynx is clear and moist.  Eyes: Conjunctivae normal and EOM are normal.  Neck: Normal range of motion. Neck supple. No JVD present. No thyromegaly present.  Cardiovascular: Normal rate, regular rhythm, normal heart sounds and intact distal pulses.   No murmur heard. Pulmonary/Chest: Effort normal and breath sounds normal. She has no wheezes. She has no rales.  Abdominal: Soft. Bowel sounds are normal. She exhibits no distension and no mass. There is no tenderness. There is no rebound and no guarding.  Genitourinary: Vagina normal.  Musculoskeletal: Normal range of motion. She exhibits no edema and no tenderness.       Brace right knee  Neurological: She is alert and oriented to person, place, and time. She has normal reflexes. No cranial nerve deficit. She exhibits normal muscle tone. Coordination normal.  Skin: Skin is warm and dry. No rash  noted.  Psychiatric: She has a normal mood and affect. Her behavior is normal.          Assessment & Plan:   Preventive health exam Hypertension stable Osteoarthritis Exogenous obesity Colonic polyps.  redo colonoscopy in 2  years  Low salt diet more regular exercise weight loss all encouraged. We'll reassess in 6 months. That regimen unchanged

## 2012-02-29 ENCOUNTER — Other Ambulatory Visit: Payer: Self-pay | Admitting: Internal Medicine

## 2012-06-30 ENCOUNTER — Encounter: Payer: Self-pay | Admitting: Internal Medicine

## 2012-06-30 ENCOUNTER — Ambulatory Visit (INDEPENDENT_AMBULATORY_CARE_PROVIDER_SITE_OTHER): Payer: Medicare Other | Admitting: Internal Medicine

## 2012-06-30 VITALS — BP 120/70 | HR 74 | Temp 97.8°F | Resp 20 | Wt 218.0 lb

## 2012-06-30 DIAGNOSIS — I10 Essential (primary) hypertension: Secondary | ICD-10-CM

## 2012-06-30 DIAGNOSIS — M542 Cervicalgia: Secondary | ICD-10-CM

## 2012-06-30 DIAGNOSIS — M159 Polyosteoarthritis, unspecified: Secondary | ICD-10-CM

## 2012-06-30 NOTE — Progress Notes (Signed)
Subjective:    Patient ID: Kristina Allison, female    DOB: June 17, 1935, 77 y.o.   MRN: [id number]  HPI  77 year old patient who has treated hypertension and also a history of osteoarthritis. She presents with a 3 to four-day history of right posterior neck pain. She awoke one morning with the pain.., Or unusual activities. Pain is worse in the morning when she first awakens and improves throughout the day. Pain is worse with head turning to the right. No constitutional complaints  Past Medical History  Diagnosis Date  . COLONIC POLYPS, HX OF 11/28/2006  . DVT 03/10/2007  . Edema 02/21/2007  . ESOPHAGITIS NOS 09/27/2006  . GOUT 06/04/2007  . HIP PAIN, LEFT 01/30/2007  . HYPERTENSION 11/28/2006  . OBESITY NOS 09/27/2006  . OSTEOARTHRITIS 11/28/2006  . OSTEOARTHROSIS, GENERALIZED, UNSPC SITE 09/27/2006  . PHLEBITIS&THROMBOPHLEBITIS LOWER EXTREM UNSPEC 03/07/2007  . STATE, SYMPTOMATIC MENOPAUSE/FEM CLIMACTERIC 09/27/2006  . VENOUS INSUFFICIENCY, CHRONIC, RIGHT LEG 09/06/2009    History   Social History  . Marital Status: Married    Spouse Name: N/A    Number of Children: N/A  . Years of Education: N/A   Occupational History  . Not on file.   Social History Main Topics  . Smoking status: Never Smoker   . Smokeless tobacco: Never Used  . Alcohol Use: No  . Drug Use: No  . Sexually Active: Not on file   Other Topics Concern  . Not on file   Social History Narrative  . No narrative on file    History reviewed. No pertinent past surgical history.  No family history on file.  Allergies  Allergen Reactions  . Sulfacetamide Sodium Itching and Rash  . Sulfamethoxazole W-Trimethoprim Itching and Rash    Current Outpatient Prescriptions on File Prior to Visit  Medication Sig Dispense Refill  . clobetasol (TEMOVATE) 0.05 % cream Apply topically 2 (two) times daily.        . hydrochlorothiazide (HYDRODIURIL) 25 MG tablet Take 1 tablet (25 mg total) by mouth daily.  90 tablet  6  .  latanoprost (XALATAN) 0.005 % ophthalmic solution Place 1 drop into both eyes at bedtime.      . potassium chloride SA (K-DUR,KLOR-CON) 20 MEQ tablet Take 1 tablet (20 mEq total) by mouth daily.  90 tablet  6   No current facility-administered medications on file prior to visit.    BP 120/70  Pulse 74  Temp(Src) 97.8 F (36.6 C) (Oral)  Resp 20  Wt 218 lb (98.884 kg)  BMI 36.28 kg/m2  SpO2 98%       Review of Systems  Constitutional: Negative.   HENT: Negative for hearing loss, congestion, sore throat, rhinorrhea, dental problem, sinus pressure and tinnitus.   Eyes: Negative for pain, discharge and visual disturbance.  Respiratory: Negative for cough and shortness of breath.   Cardiovascular: Negative for chest pain, palpitations and leg swelling.  Gastrointestinal: Negative for nausea, vomiting, abdominal pain, diarrhea, constipation, blood in stool and abdominal distention.  Genitourinary: Negative for dysuria, urgency, frequency, hematuria, flank pain, vaginal bleeding, vaginal discharge, difficulty urinating, vaginal pain and pelvic pain.  Musculoskeletal: Negative for joint swelling, arthralgias and gait problem.       Right posterior neck pain  Skin: Negative for rash.  Neurological: Negative for dizziness, syncope, speech difficulty, weakness, numbness and headaches.  Hematological: Negative for adenopathy.  Psychiatric/Behavioral: Negative for behavioral problems, dysphoric mood and agitation. The patient is not nervous/anxious.  Objective:   Physical Exam  Constitutional: She appears well-developed and well-nourished. No distress.  Blood pressure 130/70  Neck:  No local tenderness Increased pain with lateral movement to the right Slight decreased range of motion          Assessment & Plan:   Right neck pain. Probable cervical strain. We'll continue ibuprofen warm compresses and gentle massage. We'll consider a soft cervical collar if  unimproved Hypertension stable

## 2012-06-30 NOTE — Patient Instructions (Signed)

## 2012-08-19 ENCOUNTER — Encounter: Payer: Self-pay | Admitting: Internal Medicine

## 2012-08-19 ENCOUNTER — Ambulatory Visit (INDEPENDENT_AMBULATORY_CARE_PROVIDER_SITE_OTHER): Payer: Medicare Other | Admitting: Internal Medicine

## 2012-08-19 VITALS — BP 146/80 | HR 88 | Temp 98.7°F | Resp 20 | Wt 218.0 lb

## 2012-08-19 DIAGNOSIS — M1990 Unspecified osteoarthritis, unspecified site: Secondary | ICD-10-CM

## 2012-08-19 DIAGNOSIS — M109 Gout, unspecified: Secondary | ICD-10-CM

## 2012-08-19 DIAGNOSIS — I10 Essential (primary) hypertension: Secondary | ICD-10-CM

## 2012-08-19 MED ORDER — HYDROCODONE-ACETAMINOPHEN 5-300 MG PO TABS: 1.0000 | ORAL_TABLET | Freq: Four times a day (QID) | ORAL | Status: DC | PRN

## 2012-08-19 MED ORDER — PREDNISONE 20 MG PO TABS: 20.0000 mg | ORAL_TABLET | Freq: Two times a day (BID) | ORAL | Status: DC

## 2012-08-19 NOTE — Patient Instructions (Addendum)
Purine Restricted Diet  A low-purine diet consists of foods that reduce uric acid made in your body.  INDICATIONS FOR USE   Your caregiver may ask you to follow a low-purine diet to reduce gout flairs.   GUIDELINES   Avoid high-purine foods, including all alcohol, yeast extracts taken as supplements, and sauces made from meats (like gravy). Do not eat high-purine meats, including anchovies, sardines, herring, mussels, tuna, codfish, scallops, trout, haddock, bacon, organ meats, tripe, goose, wild game, and sweetbreads.   Grains   Allowed/Recommended: All, except those listed to consume in moderation.   Consume in Moderation: Oatmeal ( cup uncooked daily), wheat bran or germ ( cup daily), and whole grains.  Vegetables   Allowed/Recommended: All, except those listed to consume in moderation.   Consume in Moderation: Asparagus, cauliflower, spinach, mushrooms, and green peas ( cup daily).  Fruit   Allowed/Recommended: All.   Consume in Moderation: None.  Meat and Meat Substitutes   Allowed/Recommended: Eggs, nuts, and peanut butter.   Consume in Moderation: Limit to 4 to 6 oz daily. Avoid high-purine meats. Lentils, peas, and dried beans (1 cup daily).  Milk   Allowed/Recommended: All. Choose low-fat or skim when possible.   Consume in Moderation: None.  Fats and Oils   Allowed/Recommended: All.   Consume in Moderation: None.  Beverages   Allowed/Recommended: All, except those listed to avoid.   Avoid: All alcohol.  Condiments/Miscellaneous   Allowed/Recommended: All, except those listed to consume in moderation.   Consume in Moderation: Bouillon and meat-based broths and soups.  Document Released: 06/09/2010 Document Revised: 05/07/2011 Document Reviewed: 06/09/2010  [employer] Patient Information 2014 [employer]  Gout   Gout is an inflammatory condition (arthritis) caused by a buildup of uric acid crystals in the joints. Uric acid is a chemical that is normally present in the blood. Under some circumstances, uric acid can form into crystals in your joints. This causes joint redness, soreness, and swelling (inflammation). Repeat attacks are common. Over time, uric acid crystals can form into masses (tophi) near a joint, causing disfigurement. Gout is treatable and often preventable.  CAUSES   The disease begins with elevated levels of uric acid in the blood. Uric acid is produced by your body when it breaks down a naturally found substance called purines. This also happens when you eat certain foods such as meats and fish. Causes of an elevated uric acid level include:   Being passed down from parent to child (heredity).   Diseases that cause increased uric acid production (obesity, psoriasis, some cancers).   Excessive alcohol use.   Diet, especially diets rich in meat and seafood.   Medicines, including certain cancer-fighting drugs (chemotherapy), diuretics, and aspirin.   Chronic kidney disease. The kidneys are no longer able to remove uric acid well.   Problems with metabolism.  Conditions strongly associated with gout include:   Obesity.   High blood pressure.   High cholesterol.   Diabetes.  Not everyone with elevated uric acid levels gets gout. It is not understood why some people get gout and others do not. Surgery, joint injury, and eating too much of certain foods are some of the factors that can lead to gout.  SYMPTOMS    An attack of gout comes on quickly. It causes intense pain with redness, swelling, and warmth in a joint.   Fever can occur.   Often, only one joint is involved. Certain joints are more commonly involved:   Base of the big   toe.   Knee.   Ankle.   Wrist.   Finger.   Without treatment, an attack usually goes away in a few days to weeks. Between attacks, you usually will not have symptoms, which is different from many other forms of arthritis.  DIAGNOSIS   Your caregiver will suspect gout based on your symptoms and exam. Removal of fluid from the joint (arthrocentesis) is done to check for uric acid crystals. Your caregiver will give you a medicine that numbs the area (local anesthetic) and use a needle to remove joint fluid for exam. Gout is confirmed when uric acid crystals are seen in joint fluid, using a special microscope. Sometimes, blood, urine, and X-ray tests are also used.  TREATMENT   There are 2 phases to gout treatment: treating the sudden onset (acute) attack and preventing attacks (prophylaxis).  Treatment of an Acute Attack   Medicines are used. These include anti-inflammatory medicines or steroid medicines.   An injection of steroid medicine into the affected joint is sometimes necessary.   The painful joint is rested. Movement can worsen the arthritis.   You may use warm or cold treatments on painful joints, depending which works best for you.   Discuss the use of coffee, vitamin C, or cherries with your caregiver. These may be helpful treatment options.  Treatment to Prevent Attacks  After the acute attack subsides, your caregiver may advise prophylactic medicine. These medicines either help your kidneys eliminate uric acid from your body or decrease your uric acid production. You may need to stay on these medicines for a very long time.  The early phase of treatment with prophylactic medicine can be associated with an increase in acute gout attacks. For this reason, during the first few months of treatment, your caregiver may also advise you to take medicines usually used for acute gout treatment. Be sure you understand your caregiver's directions.   You should also discuss dietary treatment with your caregiver. Certain foods such as meats and fish can increase uric acid levels. Other foods such as dairy can decrease levels. Your caregiver can give you a list of foods to avoid.  HOME CARE INSTRUCTIONS    Do not take aspirin to relieve pain. This raises uric acid levels.   Only take over-the-counter or prescription medicines for pain, discomfort, or fever as directed by your caregiver.   Rest the joint as much as possible. When in bed, keep sheets and blankets off painful areas.   Keep the affected joint raised (elevated).   Use crutches if the painful joint is in your leg.   Drink enough water and fluids to keep your urine clear or pale yellow. This helps your body get rid of uric acid. Do not drink alcoholic beverages. They slow the passage of uric acid.   Follow your caregiver's dietary instructions. Pay careful attention to the amount of protein you eat. Your daily diet should emphasize fruits, vegetables, whole grains, and fat-free or low-fat milk products.   Maintain a healthy body weight.  SEEK MEDICAL CARE IF:    You have an oral temperature above 102 F (38.9 C).   You develop diarrhea, vomiting, or any side effects from medicines.   You do not feel better in 24 hours, or you are getting worse.  SEEK IMMEDIATE MEDICAL CARE IF:    Your joint becomes suddenly more tender and you have:   Chills.   An oral temperature above 102 F (38.9 C), not controlled by medicine.  MAKE

## 2012-08-19 NOTE — Progress Notes (Signed)
Subjective:    Patient ID: Kristina Allison, female    DOB: Aug 12, 1935, 77 y.o.   MRN: [id number]  HPI  77 year old patient who presents with a two-day history of acute pain swelling and redness involving the left great toe.  She has a history of suspected gout with an episode in 2009. She has osteoarthritis and no history of trauma. She has treated hypertension on diuretic therapy  Past Medical History  Diagnosis Date  . COLONIC POLYPS, HX OF 11/28/2006  . DVT 03/10/2007  . Edema 02/21/2007  . ESOPHAGITIS NOS 09/27/2006  . GOUT 06/04/2007  . HIP PAIN, LEFT 01/30/2007  . HYPERTENSION 11/28/2006  . OBESITY NOS 09/27/2006  . OSTEOARTHRITIS 11/28/2006  . OSTEOARTHROSIS, GENERALIZED, UNSPC SITE 09/27/2006  . PHLEBITIS&THROMBOPHLEBITIS LOWER EXTREM UNSPEC 03/07/2007  . STATE, SYMPTOMATIC MENOPAUSE/FEM CLIMACTERIC 09/27/2006  . VENOUS INSUFFICIENCY, CHRONIC, RIGHT LEG 09/06/2009    History   Social History  . Marital Status: Married    Spouse Name: N/A    Number of Children: N/A  . Years of Education: N/A   Occupational History  . Not on file.   Social History Main Topics  . Smoking status: Never Smoker   . Smokeless tobacco: Never Used  . Alcohol Use: No  . Drug Use: No  . Sexually Active: Not on file   Other Topics Concern  . Not on file   Social History Narrative  . No narrative on file    History reviewed. No pertinent past surgical history.  No family history on file.  Allergies  Allergen Reactions  . Sulfacetamide Sodium Itching and Rash  . Sulfamethoxazole W-Trimethoprim Itching and Rash    Current Outpatient Prescriptions on File Prior to Visit  Medication Sig Dispense Refill  . clobetasol (TEMOVATE) 0.05 % cream Apply topically 2 (two) times daily.        . hydrochlorothiazide (HYDRODIURIL) 25 MG tablet Take 1 tablet (25 mg total) by mouth daily.  90 tablet  6  . latanoprost (XALATAN) 0.005 % ophthalmic solution Place 1 drop into both eyes at bedtime.      . potassium  chloride SA (K-DUR,KLOR-CON) 20 MEQ tablet Take 1 tablet (20 mEq total) by mouth daily.  90 tablet  6   No current facility-administered medications on file prior to visit.    BP 146/80  Pulse 88  Temp(Src) 98.7 F (37.1 C) (Oral)  Resp 20  Wt 218 lb (98.884 kg)  BMI 36.28 kg/m2  SpO2 95%       Review of Systems  Constitutional: Negative.   HENT: Negative for hearing loss, congestion, sore throat, rhinorrhea, dental problem, sinus pressure and tinnitus.   Eyes: Negative for pain, discharge and visual disturbance.  Respiratory: Negative for cough and shortness of breath.   Cardiovascular: Negative for chest pain, palpitations and leg swelling.  Gastrointestinal: Negative for nausea, vomiting, abdominal pain, diarrhea, constipation, blood in stool and abdominal distention.  Genitourinary: Negative for dysuria, urgency, frequency, hematuria, flank pain, vaginal bleeding, vaginal discharge, difficulty urinating, vaginal pain and pelvic pain.  Musculoskeletal: Positive for joint swelling and arthralgias. Negative for gait problem.  Skin: Negative for rash.  Neurological: Negative for dizziness, syncope, speech difficulty, weakness, numbness and headaches.  Hematological: Negative for adenopathy.  Psychiatric/Behavioral: Negative for behavioral problems, dysphoric mood and agitation. The patient is not nervous/anxious.        Objective:   Physical Exam  Constitutional: She appears well-developed and well-nourished. No distress.  Musculoskeletal:  The left first MTP joint swelling  red and hot to touch          Assessment & Plan:   Probable acute gouty arthritis. Will check a uric acid level. Will treat with the prednisone Vicodin. Will consider alternative blood pressure medication as uric acid level is elevated

## 2012-09-08 ENCOUNTER — Other Ambulatory Visit: Payer: Self-pay | Admitting: Internal Medicine

## 2012-09-08 MED ORDER — PREDNISONE 20 MG PO TABS: 20.0000 mg | ORAL_TABLET | Freq: Two times a day (BID) | ORAL | Status: DC

## 2012-09-29 ENCOUNTER — Telehealth: Payer: Self-pay | Admitting: Internal Medicine

## 2012-09-29 ENCOUNTER — Encounter: Payer: Self-pay | Admitting: Family

## 2012-09-29 ENCOUNTER — Ambulatory Visit (INDEPENDENT_AMBULATORY_CARE_PROVIDER_SITE_OTHER): Payer: Medicare Other | Admitting: Family

## 2012-09-29 VITALS — BP 152/84 | HR 89 | Wt 219.0 lb

## 2012-09-29 DIAGNOSIS — M79675 Pain in left toe(s): Secondary | ICD-10-CM

## 2012-09-29 DIAGNOSIS — M79609 Pain in unspecified limb: Secondary | ICD-10-CM

## 2012-09-29 DIAGNOSIS — M109 Gout, unspecified: Secondary | ICD-10-CM

## 2012-09-29 DIAGNOSIS — I10 Essential (primary) hypertension: Secondary | ICD-10-CM

## 2012-09-29 MED ORDER — LISINOPRIL 10 MG PO TABS: 10.0000 mg | ORAL_TABLET | Freq: Every day | ORAL | Status: DC

## 2012-09-29 MED ORDER — ALLOPURINOL 100 MG PO TABS: 100.0000 mg | ORAL_TABLET | Freq: Every day | ORAL | Status: DC

## 2012-09-29 MED ORDER — METHYLPREDNISOLONE 4 MG PO KIT: PACK | ORAL | Status: AC

## 2012-09-29 NOTE — Telephone Encounter (Signed)
Patient Information:  Caller Name: Kristina Allison  Phone: [phone]  Patient: Kristina Allison, Kristina Allison  Gender: Female  DOB: 1935-06-27  Age: 77 Years  PCP: Eleonore Chiquito [facility])  Office Follow Up:  Does the office need to follow up with this patient?: No  Instructions For The Office: N/A  RN Note:  Onset of new flare 09/26/12.  Afebrile.  States was diagnosed with gout in June 2014, and this is the third bout/flare since then.  Has used prednisone for this with good results.  Notes warmth, swelling and redness of the left great toe.  Per foot pain protocol, advised being seen today due to patient c/o severe pain in toe preventing usual activities.  Appt scheduled 09/29/12 1130 with Ms. Orvan Falconer.  krs/can  Symptoms  Reason For Call & Symptoms: foot pain left gout flare  Reviewed Health History In EMR: Yes  Reviewed Medications In EMR: Yes  Reviewed Allergies In EMR: Yes  Reviewed Surgeries / Procedures: Yes  Date of Onset of Symptoms: 09/26/2012  Guideline(s) Used:  Foot Pain  Disposition Per Guideline:   See Today in Office  Reason For Disposition Reached:   Severe pain (e.g., excruciating, unable to do any normal activities)  Advice Given:  N/A  Patient Will Follow Care Advice:  YES  Appointment Scheduled:  09/29/2012 11:30:00 Appointment Scheduled Provider:  Adline Mango [facility])

## 2012-09-29 NOTE — Patient Instructions (Addendum)
Gout  Gout is an inflammatory condition (arthritis) caused by a buildup of uric acid crystals in the joints. Uric acid is a chemical that is normally present in the blood. Under some circumstances, uric acid can form into crystals in your joints. This causes joint redness, soreness, and swelling (inflammation). Repeat attacks are common. Over time, uric acid crystals can form into masses (tophi) near a joint, causing disfigurement. Gout is treatable and often preventable.  CAUSES   The disease begins with elevated levels of uric acid in the blood. Uric acid is produced by your body when it breaks down a naturally found substance called purines. This also happens when you eat certain foods such as meats and fish. Causes of an elevated uric acid level include:   Being passed down from parent to child (heredity).   Diseases that cause increased uric acid production (obesity, psoriasis, some cancers).   Excessive alcohol use.   Diet, especially diets rich in meat and seafood.   Medicines, including certain cancer-fighting drugs (chemotherapy), diuretics, and aspirin.   Chronic kidney disease. The kidneys are no longer able to remove uric acid well.   Problems with metabolism.  Conditions strongly associated with gout include:   Obesity.   High blood pressure.   High cholesterol.   Diabetes.  Not everyone with elevated uric acid levels gets gout. It is not understood why some people get gout and others do not. Surgery, joint injury, and eating too much of certain foods are some of the factors that can lead to gout.  SYMPTOMS    An attack of gout comes on quickly. It causes intense pain with redness, swelling, and warmth in a joint.   Fever can occur.   Often, only one joint is involved. Certain joints are more commonly involved:   Base of the big toe.   Knee.   Ankle.   Wrist.   Finger.  Without treatment, an attack usually goes away in a few days to weeks. Between attacks, you usually will not have  symptoms, which is different from many other forms of arthritis.  DIAGNOSIS   Your caregiver will suspect gout based on your symptoms and exam. Removal of fluid from the joint (arthrocentesis) is done to check for uric acid crystals. Your caregiver will give you a medicine that numbs the area (local anesthetic) and use a needle to remove joint fluid for exam. Gout is confirmed when uric acid crystals are seen in joint fluid, using a special microscope. Sometimes, blood, urine, and X-ray tests are also used.  TREATMENT   There are 2 phases to gout treatment: treating the sudden onset (acute) attack and preventing attacks (prophylaxis).  Treatment of an Acute Attack   Medicines are used. These include anti-inflammatory medicines or steroid medicines.   An injection of steroid medicine into the affected joint is sometimes necessary.   The painful joint is rested. Movement can worsen the arthritis.   You may use warm or cold treatments on painful joints, depending which works best for you.   Discuss the use of coffee, vitamin C, or cherries with your caregiver. These may be helpful treatment options.  Treatment to Prevent Attacks  After the acute attack subsides, your caregiver may advise prophylactic medicine. These medicines either help your kidneys eliminate uric acid from your body or decrease your uric acid production. You may need to stay on these medicines for a very long time.  The early phase of treatment with prophylactic medicine can be associated   with an increase in acute gout attacks. For this reason, during the first few months of treatment, your caregiver may also advise you to take medicines usually used for acute gout treatment. Be sure you understand your caregiver's directions.  You should also discuss dietary treatment with your caregiver. Certain foods such as meats and fish can increase uric acid levels. Other foods such as dairy can decrease levels. Your caregiver can give you a list of foods  to avoid.  HOME CARE INSTRUCTIONS    Do not take aspirin to relieve pain. This raises uric acid levels.   Only take over-the-counter or prescription medicines for pain, discomfort, or fever as directed by your caregiver.   Rest the joint as much as possible. When in bed, keep sheets and blankets off painful areas.   Keep the affected joint raised (elevated).   Use crutches if the painful joint is in your leg.   Drink enough water and fluids to keep your urine clear or pale yellow. This helps your body get rid of uric acid. Do not drink alcoholic beverages. They slow the passage of uric acid.   Follow your caregiver's dietary instructions. Pay careful attention to the amount of protein you eat. Your daily diet should emphasize fruits, vegetables, whole grains, and fat-free or low-fat milk products.   Maintain a healthy body weight.  SEEK MEDICAL CARE IF:    You have an oral temperature above 102 F (38.9 C).   You develop diarrhea, vomiting, or any side effects from medicines.   You do not feel better in 24 hours, or you are getting worse.  SEEK IMMEDIATE MEDICAL CARE IF:    Your joint becomes suddenly more tender and you have:   Chills.   An oral temperature above 102 F (38.9 C), not controlled by medicine.  MAKE SURE YOU:    Understand these instructions.   Will watch your condition.   Will get help right away if you are not doing well or get worse.  Document Released: 02/10/2000 Document Revised: 05/07/2011 Document Reviewed: 05/23/2009  [employer] Patient Information 2014 [employer].

## 2012-09-29 NOTE — Progress Notes (Signed)
Subjective:    Patient ID: Kristina Allison, female    DOB: [birth year], 77 y.o.   MRN: [id number]  HPI 77 year old white female, nonsmoker, patient of Dr. Kirtland Bouchard. his in today with complaints of a gout flare to her left foot x2-3 days. Reports her left great toe pain radiates, tender, swollen and painful. She rates the pain a 5-6/10, worse to touch and with movement. This is her third flare in the last 2 months.   Review of Systems  Constitutional: Negative.   Respiratory: Negative.   Cardiovascular: Negative.   Musculoskeletal: Positive for arthralgias.       Left great toe pain and swelling  Skin: Negative.        Left great toe red, warm to touch  Neurological: Negative.   Psychiatric/Behavioral: Negative.    Past Medical History  Diagnosis Date  . COLONIC POLYPS, HX OF 11/28/2006  . DVT 03/10/2007  . Edema 02/21/2007  . ESOPHAGITIS NOS 09/27/2006  . GOUT 06/04/2007  . HIP PAIN, LEFT 01/30/2007  . HYPERTENSION 11/28/2006  . OBESITY NOS 09/27/2006  . OSTEOARTHRITIS 11/28/2006  . OSTEOARTHROSIS, GENERALIZED, UNSPC SITE 09/27/2006  . PHLEBITIS&THROMBOPHLEBITIS LOWER EXTREM UNSPEC 03/07/2007  . STATE, SYMPTOMATIC MENOPAUSE/FEM CLIMACTERIC 09/27/2006  . VENOUS INSUFFICIENCY, CHRONIC, RIGHT LEG 09/06/2009    History   Social History  . Marital Status: Married    Spouse Name: N/A    Number of Children: N/A  . Years of Education: N/A   Occupational History  . Not on file.   Social History Main Topics  . Smoking status: Never Smoker   . Smokeless tobacco: Never Used  . Alcohol Use: No  . Drug Use: No  . Sexually Active: Not on file   Other Topics Concern  . Not on file   Social History Narrative  . No narrative on file    History reviewed. No pertinent past surgical history.  No family history on file.  Allergies  Allergen Reactions  . Sulfacetamide Sodium Itching and Rash  . Sulfamethoxazole W-Trimethoprim Itching and Rash    Current Outpatient Prescriptions on File Prior to  Visit  Medication Sig Dispense Refill  . clobetasol (TEMOVATE) 0.05 % cream Apply topically 2 (two) times daily.        Marland Kitchen latanoprost (XALATAN) 0.005 % ophthalmic solution Place 1 drop into both eyes at bedtime.      . potassium chloride SA (K-DUR,KLOR-CON) 20 MEQ tablet Take 1 tablet (20 mEq total) by mouth daily.  90 tablet  6  . Hydrocodone-Acetaminophen 5-300 MG TABS Take 1 tablet by mouth every 6 (six) hours as needed.  30 each  0   No current facility-administered medications on file prior to visit.    BP 152/84  Pulse 89  Wt 219 lb (99.338 kg)  BMI 36.44 kg/m2chart    Objective:   Physical Exam  Constitutional: She is oriented to person, place, and time. She appears well-developed and well-nourished.  Neck: Normal range of motion. Neck supple.  Cardiovascular: Normal rate, regular rhythm and normal heart sounds.   Pulmonary/Chest: Effort normal and breath sounds normal.  Neurological: She is alert and oriented to person, place, and time.  Skin: Skin is warm and dry. There is erythema.  Left great toe pain. Red, tender to touch.   Psychiatric: She has a normal mood and affect.          Assessment & Plan:  Assessment: 1. Gout 2. Great toe pain 3. Hypertension  Plan: Discontinue hydrochlorothiazide. I believe  that this may be the culprit for gout flares of the last couple months. Start lisinopril 10 mg once daily. Recheck in 3-4 weeks to assess her blood pressure and be sure she's doing better. We may also have to discontinue potassium but we can check a BNP to be sure she stable. Medrol Dosepak as directed for acute flare. Cardiologist with any questions or concerns. Recheck as scheduled, and as needed.

## 2012-12-10 ENCOUNTER — Ambulatory Visit (INDEPENDENT_AMBULATORY_CARE_PROVIDER_SITE_OTHER): Payer: Medicare Other

## 2012-12-10 DIAGNOSIS — Z23 Encounter for immunization: Secondary | ICD-10-CM

## 2013-01-28 ENCOUNTER — Other Ambulatory Visit: Payer: Self-pay | Admitting: Family

## 2013-02-02 ENCOUNTER — Encounter: Payer: Self-pay | Admitting: Internal Medicine

## 2013-02-02 ENCOUNTER — Ambulatory Visit (INDEPENDENT_AMBULATORY_CARE_PROVIDER_SITE_OTHER): Payer: Medicare Other | Admitting: Internal Medicine

## 2013-02-02 VITALS — BP 130/84 | HR 71 | Temp 97.9°F | Resp 20 | Ht 65.5 in | Wt 216.0 lb

## 2013-02-02 DIAGNOSIS — Z23 Encounter for immunization: Secondary | ICD-10-CM

## 2013-02-02 DIAGNOSIS — M1990 Unspecified osteoarthritis, unspecified site: Secondary | ICD-10-CM

## 2013-02-02 DIAGNOSIS — I872 Venous insufficiency (chronic) (peripheral): Secondary | ICD-10-CM

## 2013-02-02 DIAGNOSIS — M109 Gout, unspecified: Secondary | ICD-10-CM

## 2013-02-02 DIAGNOSIS — I10 Essential (primary) hypertension: Secondary | ICD-10-CM

## 2013-02-02 DIAGNOSIS — E785 Hyperlipidemia, unspecified: Secondary | ICD-10-CM

## 2013-02-02 DIAGNOSIS — E669 Obesity, unspecified: Secondary | ICD-10-CM

## 2013-02-02 DIAGNOSIS — R079 Chest pain, unspecified: Secondary | ICD-10-CM

## 2013-02-02 DIAGNOSIS — Z0000 Encounter for general adult medical examination without abnormal findings: Secondary | ICD-10-CM

## 2013-02-02 LAB — CBC WITH DIFFERENTIAL/PLATELET
Basophils Absolute: 0.1 10*3/uL (ref 0.0–0.1)
Basophils Relative: 0.7 % (ref 0.0–3.0)
Eosinophils Absolute: 0.3 10*3/uL (ref 0.0–0.7)
HCT: 42.8 % (ref 36.0–46.0)
Hemoglobin: 14.1 g/dL (ref 12.0–15.0)
Lymphocytes Relative: 32 % (ref 12.0–46.0)
Lymphs Abs: 2.6 10*3/uL (ref 0.7–4.0)
MCHC: 33.1 g/dL (ref 30.0–36.0)
Monocytes Relative: 5.8 % (ref 3.0–12.0)
Neutro Abs: 4.7 10*3/uL (ref 1.4–7.7)
Neutrophils Relative %: 57.8 % (ref 43.0–77.0)
Platelets: 297 10*3/uL (ref 150.0–400.0)
RBC: 4.7 Mil/uL (ref 3.87–5.11)
RDW: 13.2 % (ref 11.5–14.6)
WBC: 8.1 10*3/uL (ref 4.5–10.5)

## 2013-02-02 LAB — LIPID PANEL
Cholesterol: 157 mg/dL (ref 0–200)
HDL: 47.3 mg/dL (ref >39.00)
LDL Cholesterol: 93 mg/dL (ref 0–99)
Total CHOL/HDL Ratio: 3
Triglycerides: 82 mg/dL (ref 0.0–149.0)
VLDL: 16.4 mg/dL (ref 0.0–40.0)

## 2013-02-02 LAB — COMPREHENSIVE METABOLIC PANEL
ALT: 18 U/L (ref 0–35)
AST: 17 U/L (ref 0–37)
Albumin: 3.9 g/dL (ref 3.5–5.2)
BUN: 13 mg/dL (ref 6–23)
Calcium: 9.6 mg/dL (ref 8.4–10.5)
Creatinine, Ser: 0.6 mg/dL (ref 0.4–1.2)
Potassium: 4.6 mEq/L (ref 3.5–5.1)
Total Bilirubin: 1 mg/dL (ref 0.3–1.2)

## 2013-02-02 MED ORDER — CLOBETASOL PROPIONATE 0.05 % EX CREA: TOPICAL_CREAM | Freq: Two times a day (BID) | CUTANEOUS | Status: DC

## 2013-02-02 MED ORDER — ALLOPURINOL 100 MG PO TABS: 100.0000 mg | ORAL_TABLET | Freq: Every day | ORAL | Status: DC

## 2013-02-02 MED ORDER — LISINOPRIL 10 MG PO TABS: ORAL_TABLET | ORAL | Status: DC

## 2013-02-02 MED ORDER — POTASSIUM CHLORIDE CRYS ER 20 MEQ PO TBCR: 20.0000 meq | EXTENDED_RELEASE_TABLET | Freq: Every day | ORAL | Status: DC

## 2013-02-02 NOTE — Patient Instructions (Signed)
Limit your sodium (Salt) intake  Please check your blood pressure on a regular basis.  If it is consistently greater than 150/90, please make an office appointment.    It is important that you exercise regularly, at least 20 minutes 3 to 4 times per week.  If you develop chest pain or shortness of breath seek  medical attention.  You need to lose weight.  Consider a lower calorie diet and regular exercise.  Take a calcium supplement, plus 800-1200 units of vitamin D  Return in one year for follow-up  

## 2013-02-02 NOTE — Progress Notes (Signed)
Patient ID: Bita Cartwright, female   DOB: 01-25-1936, 77 y.o.   MRN: [id number]  Subjective:    Patient ID: Curtis Sites, female    DOB: [birth year], 77 y.o.   MRN: [id number]  Wt Readings from Last 3 Encounters:  02/02/13 216 lb (97.977 kg)  09/29/12 219 lb (99.338 kg)  08/19/12 218 lb (98.884 kg)    Hypertension Pertinent negatives include no chest pain, headaches, palpitations or shortness of breath.  CC: cpx - doing ok.   History of Present Illness:   77 year-old patient who is seen today for a wellness exam. Medical problems include a history of chronic venous insufficiency and osteoarthritis, involving the right knee. She is followed by orthopedics. She has hypertension. History colonic polyps and exogenous obesity. She has been evaluated for a suspected right C8 radiculopathy. Symptoms have since resolved.  Imaging studies failed to reveal a significant anatomical defect although she did have fairly diffuse osteoarthritic changes  She is evaluated by GYN annually and does obtain annual mammogram  Here for Medicare AWV:  1. Risk factors based on Past M, S, F history: Cardiovascular - risk factors include a history of hypertension.  2. Physical Activities: fairly active and cares for a number of children and grandchildren  3. Depression/mood: history of depression or mood disorder, although considerable situational stress  4. Hearing: no hearing impairment;  describe some vertigo and occasional draining from the left ear 5. ADL's: independent in all aspects of daily living  6. Fall Risk: low  7. Home Safety: no problems identified  8. Height, weight, &visual acuity:height and weight are stable. No change in visual acuity  9. Counseling: heart healthy diet modest weight loss encouraged  10. Labs ordered based on risk factors: laboratory profile, including lipid panel and TSH will be reviewed  11. Referral Coordination- GYN referral  Last year; to return prn 12. Care Plan- heart  healthy diet, weight loss encouraged  13. Cognitive Assessment- alert and oriented, with normal affect. History of memory dysfunction and also executive functioning without difficulty   Preventive Screening-Counseling & Management  Alcohol-Tobacco  Smoking Status: never   Allergies:  1) ! Sulf-10  2) ! Bactrim Ds (Sulfamethoxazole-Trimethoprim)   Past History:   Colonic polyps, hx of  Hypertension  Osteoarthritis  history of lower extremity DVT January 2009  Fibroids  impaired glucose tolerance  chronic venous insufficiency   Past Surgical History:   Cholecystectomy 1998  Hysterectomy  cataract surgery os  colonoscopy in January 2010  bilateral salpingo-oophorectomy in September 2010   Family History:   Father died age 75, history of Parkinson's disease, senile dementia  mother has senile dementia presently 64 one brother in good health  one sister died of cardiac complications at age 75, history of EtOH   Social History:   cares for a disabled son  4 children, and 3 adopted children. 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  HENT: Negative for congestion, dental problem, ear pain, hearing loss, mouth sores, nosebleeds, sinus pressure, sore throat, tinnitus, trouble swallowing and voice change.   Eyes: Negative for photophobia, pain, redness and visual disturbance.  Respiratory: Negative for cough, chest tightness and shortness of breath.   Cardiovascular: Negative for chest pain,  palpitations and leg swelling.  Gastrointestinal: Negative for nausea, vomiting, abdominal pain, diarrhea, constipation, blood in stool, abdominal distention and rectal pain.  Genitourinary: Negative for dysuria, urgency, frequency, hematuria, flank pain,  vaginal bleeding, vaginal discharge, difficulty urinating, genital sores, vaginal pain, menstrual problem and pelvic pain.  Musculoskeletal: Positive for gait problem. Negative for arthralgias (significant right knee pain. Wears a brace), back pain and neck stiffness.  Skin: Negative for rash.  Neurological: Negative for dizziness, syncope, speech difficulty, weakness, light-headedness, numbness and headaches.  Hematological: Negative for adenopathy. Does not bruise/bleed easily.  Psychiatric/Behavioral: Negative for suicidal ideas, behavioral problems, self-injury, dysphoric mood and agitation. The patient is not nervous/anxious.        Objective:   Physical Exam  Constitutional: She is oriented to person, place, and time. She appears well-developed and well-nourished.  Weight 218  HENT:  Head: Normocephalic and atraumatic.  Right Ear: External ear normal.  Left Ear: External ear normal.  Mouth/Throat: Oropharynx is clear and moist.  Eyes: Conjunctivae and EOM are normal.  Neck: Normal range of motion. Neck supple. No JVD present. No thyromegaly present.  Cardiovascular: Normal rate, regular rhythm, normal heart sounds and intact distal pulses.   No murmur heard. Pulmonary/Chest: Effort normal and breath sounds normal. She has no wheezes. She has no rales.  Abdominal: Soft. Bowel sounds are normal. She exhibits no distension and no mass. There is no tenderness. There is no rebound and no guarding.  Genitourinary: Vagina normal.  Musculoskeletal: Normal range of motion. She exhibits no edema and no tenderness.  Brace right knee  Neurological: She is alert and oriented to person, place, and time. She has normal reflexes. No cranial nerve deficit. She exhibits normal muscle tone. Coordination normal.  Skin: Skin is warm and dry. No rash noted.  Psychiatric: She has a normal mood and affect. Her behavior is normal.          Assessment & Plan:   Preventive health exam Hypertension  stable Osteoarthritis Exogenous obesity Colonic polyps.  redo colonoscopy in 1   year  Low salt diet more regular exercise weight loss all encouraged. We'll reassess in 6 months. That regimen unchanged

## 2013-02-02 NOTE — Progress Notes (Signed)
Pre-visit discussion using our clinic review tool. No additional management support is needed unless otherwise documented below in the visit note.  

## 2013-02-10 ENCOUNTER — Telehealth: Payer: Self-pay | Admitting: Internal Medicine

## 2013-02-10 NOTE — Telephone Encounter (Signed)
FYI  pt had flu shot last month at [facility]

## 2013-02-10 NOTE — Telephone Encounter (Signed)
Already documented received from [facility]

## 2013-02-26 IMAGING — CT CT CERVICAL SPINE W/ CM
4 of 5 series · 13 of 27 positions shown, 14 images · IV contrast (omnipaque)
Comparison: MRI 12/07/2011

CLINICAL DATA: Multilevel spondylosis by MRI.  Right arm pain.
Bilateral hand numbness.

 MYELOGRAM INJECTION
TECHNIQUE: Informed consent was obtained from the patient prior to
the procedure, including potential complications of headache,
allergy, infection and pain.  A timeout procedure was performed.
With the patient prone, the lower back was prepped with Betadine.
1% Lidocaine was used for local anesthesia.  Lumbar puncture was
performed at the right L3-4 level using a 22 gauge needle with
return of clear CSF.  Nine ml of Omnipaque 866was injected into the
subarachnoid space .
TECHNIQUE: Following injection of intrathecal Omnipaque contrast,
spine imaging in multiple projections was performed using
fluoroscopy.
Fluoroscopy Time: 1 minute 21 seconds .
TECHNIQUE: CT imaging of the cervical spine was performed after
intrathecal contrast administration. Multiplanar CT image
reconstructions were also generated.

[Series 2: c spine bone · axial · 0.23mm/px · z∈[-171,-116]mm · 2 of 67 slices shown]
[im 23/67  bone]
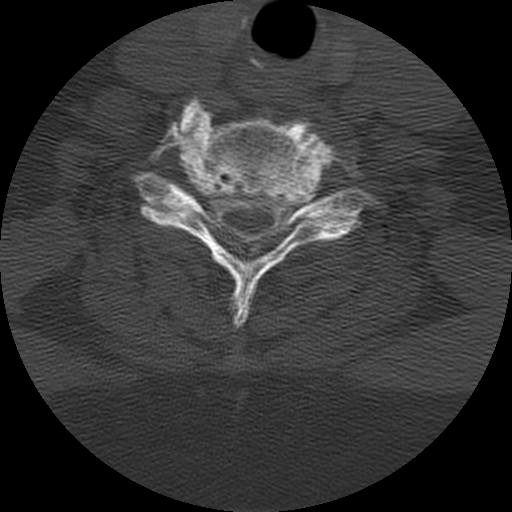
[im 45/67  bone]
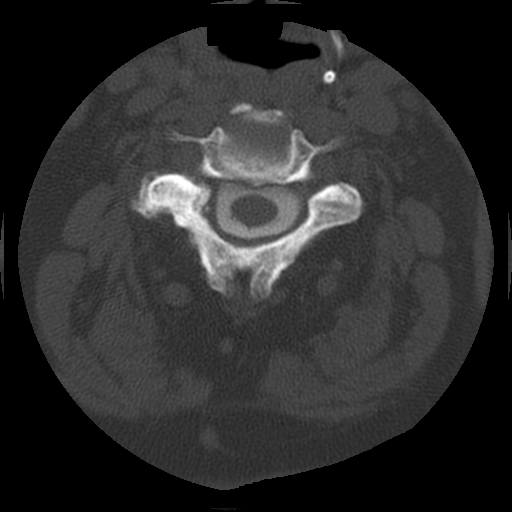

[Series 3: c spine soft · axial · 0.23mm/px · z∈[-171,-116]mm · 2 of 66 slices shown]
[im 22/66  soft-tissue]
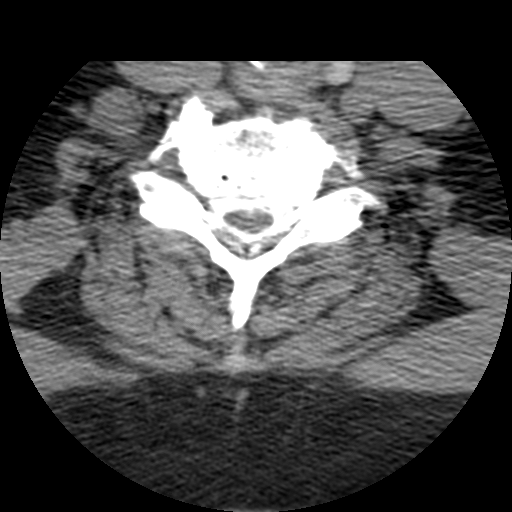
[im 44/66  soft-tissue]
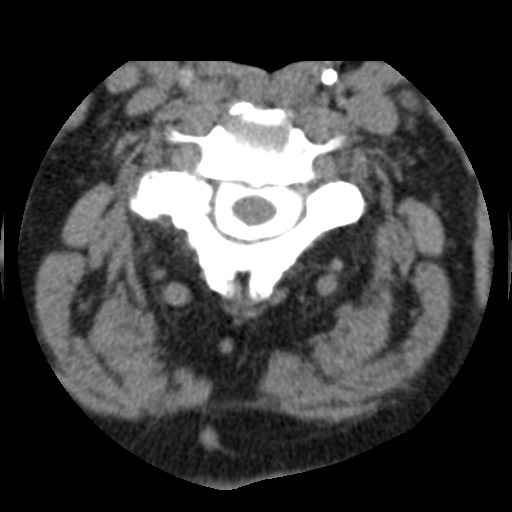

[Series 103: axial · axial · 0.23mm/px · z∈[-199,-98]mm · 4 of 87 slices shown, 5 images]
[im 18/87  soft-tissue]
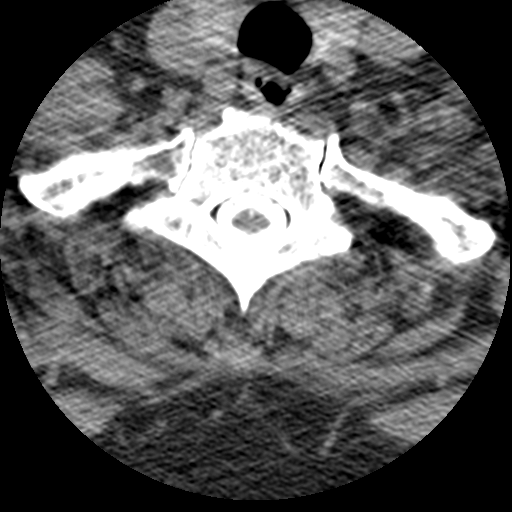
[im 18/87  bone]
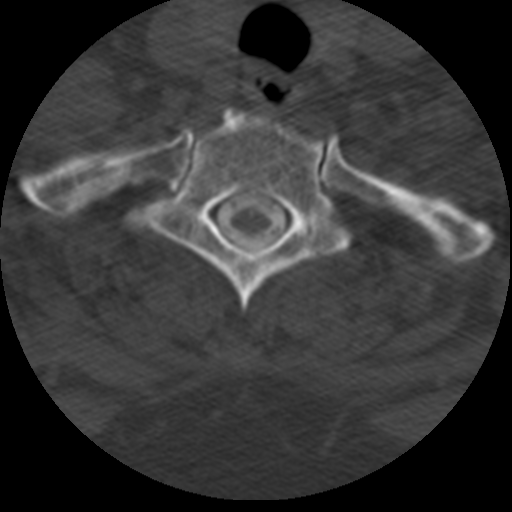
[im 35/87  bone]
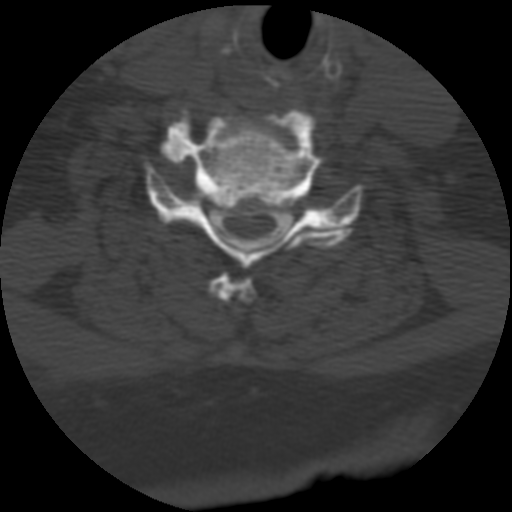
[im 52/87  bone]
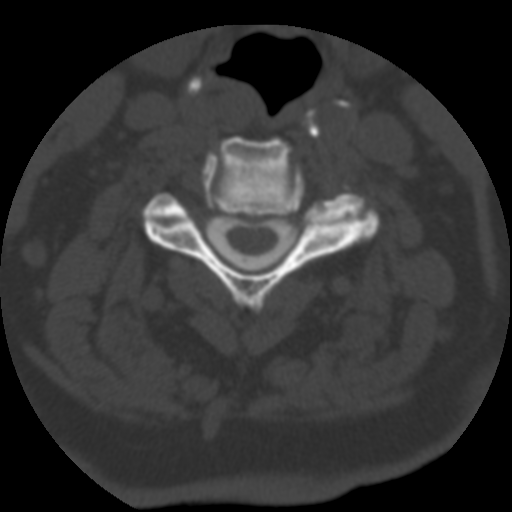
[im 69/87  bone]
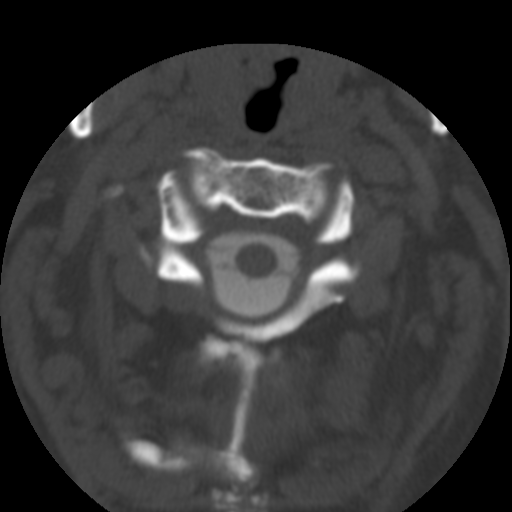

[Series 400: cor · coronal · 0.33mm/px · 5 of 35 slices shown]
[im 6/35  bone]
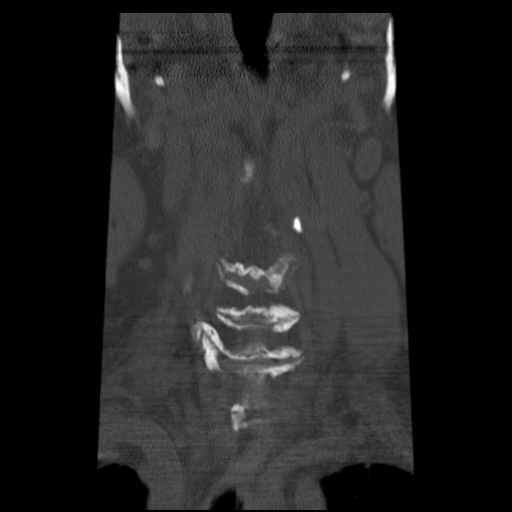
[im 12/35  bone]
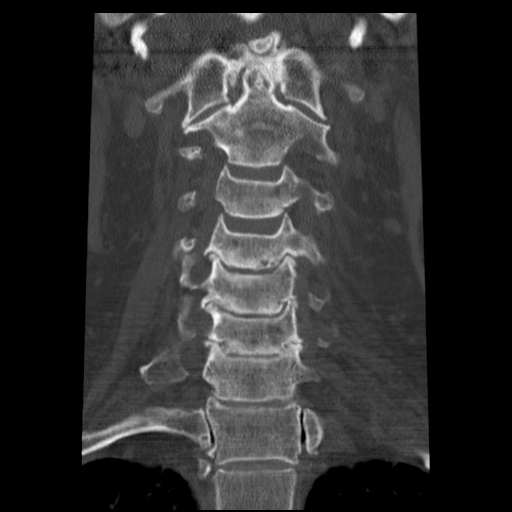
[im 18/35  bone]
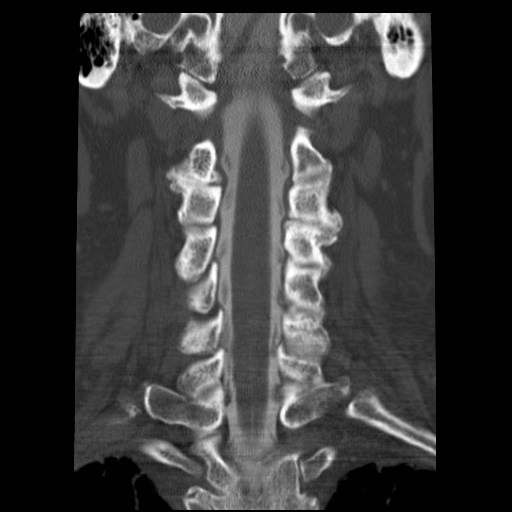
[im 23/35  bone]
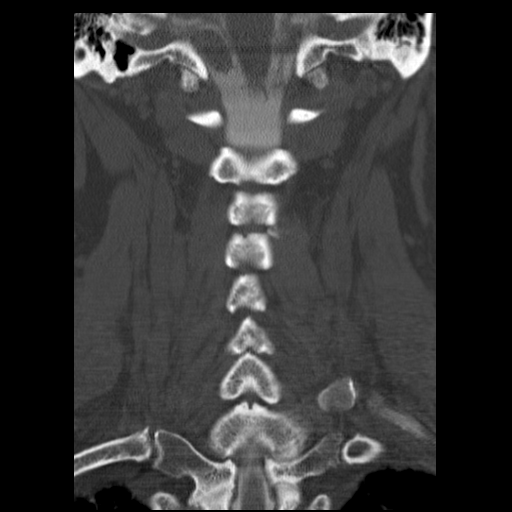
[im 29/35  bone]
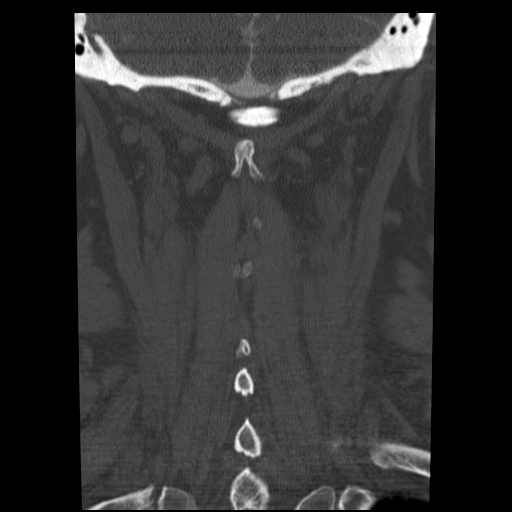

[13 of 27 positions shown; findings below may reference images not displayed]

IMPRESSION: Successful injection of  intrathecal contrast for myelography.

MYELOGRAM CERVICAL
FINDINGS: Contrast opacity is only moderate because of the
patient's kyphosis.

There are anterior extradural defects at C4-5, C5-6 and C6-7 with
narrowing of the canal but no apparent gross cord compression.
There is diminished filling of the root sleeves bilaterally at C4-
5, C5-6, C6-7 and C7-T1.  See CT report for details.
IMPRESSION: Multilevel cervical spondylosis.  Canal narrowing from C4-5 through
C6-7 without gross cord compression.  Diminished filling of the
root sleeves bilaterally from C4-5 through C7-T1.

CT MYELOGRAPHY CERVICAL SPINE
FINDINGS: The foramen magnum is widely patent.  There is ordinary
degenerative arthritis between the anterior arch of C1 and the
dens.  No encroachment upon the neural spaces.

C2-3:  There is facet arthropathy on the right.  There is
anterolisthesis of 2 mm.  No disc herniation.  Foraminal narrowing
on the right could possibly affect the C3 nerve root.

C3-4:  There is facet arthropathy on the left with 2 mm of
anterolisthesis.  There is a central disc herniation that narrows
the ventral subarachnoid space but does not compress the cord.
There is foraminal stenosis on the left that could effect the C4
nerve root.

C4-5:  There is chronic spondylosis with disc space narrowing,
endplate osteophytes and shallow protrusion of disc material.  The
ventral subarachnoid space is narrowed but the cord is not
compressed.  There is moderate foraminal narrowing bilaterally
because of osteophytic encroachment.

C5-6:  There is spondylosis with disc space narrowing.  There are
endplate osteophytes there is bulging of the disc. There may be a
small right posterolateral disc herniation.  There is foraminal
stenosis right worse than left. Either C6 nerve root could be
affected.  Right C6 nerve root compression seems likely.

C6-7:  There is chronic spondylosis with disc space narrowing,
endplate osteophytes and bulging of the disc.  There is mild facet
degeneration bilaterally.  Subarachnoid space surrounding the cord
is narrowed but the cord is not compressed.  There is foraminal
encroachment by osteophytes bilaterally that could effect either C7
nerve root.

C7-T1:  The disc is chronically degenerated with disc space
narrowing, endplate osteophytes and bulging of the disc.  Narrowing
of the ventral subarachnoid space but no cord compression.
Osteophytic encroachment upon the foramina of a mild degree. I
think there is probably a right foraminal disc herniation, but this
is difficult to see because of shoulder density.  I based this more
on correlation with the MRI scan.

T1-2:  Unremarkable interspace.
IMPRESSION: C2-3:  Facet arthropathy on the right.  Foraminal encroachment on
the right that could possibly irritate the exiting C3 nerve root.
Gross compression is not demonstrated.

C3-4:  Facet arthropathy on the left.  Foraminal narrowing on the
left that could possibly affect the C4 nerve root.

C4-5:  Chronic degenerative spondylosis.  Foraminal encroachment by
osteophytes bilaterally that could possibly effect either C5 nerve
root.

C5-6:  Chronic degenerative spondylosis.  Possible small disc
protrusion on the right.  Foraminal stenosis bilaterally, worse on
the right.  Either C6 nerve root could be affected, more likely the
right.

C6-7:  Chronic degenerative spondylosis.  Neural foraminal stenosis
bilaterally that could effect either C7 nerve root.

C7-T1:  Chronic degenerative spondylosis. I think there is a right
foraminal disc herniation in this case.  I base this more upon the
MRI finding.  Foraminal detail at this level is poor because of
shoulder density on the CT scan. On the MRI scan, I think the right
C8 nerve root is swollen because of a small foraminal disc
fragment.

## 2013-02-27 ENCOUNTER — Other Ambulatory Visit: Payer: Self-pay | Admitting: Internal Medicine

## 2013-04-21 ENCOUNTER — Other Ambulatory Visit: Payer: Self-pay | Admitting: Family

## 2013-06-11 ENCOUNTER — Other Ambulatory Visit: Payer: Self-pay | Admitting: Internal Medicine

## 2013-06-11 MED ORDER — LOSARTAN POTASSIUM 100 MG PO TABS: 100.0000 mg | ORAL_TABLET | Freq: Every day | ORAL | Status: DC

## 2013-06-29 LAB — HM MAMMOGRAPHY

## 2013-07-02 ENCOUNTER — Encounter: Payer: Self-pay | Admitting: Internal Medicine

## 2013-08-13 ENCOUNTER — Other Ambulatory Visit: Payer: Self-pay | Admitting: Dermatology

## 2013-10-15 ENCOUNTER — Encounter: Payer: Self-pay | Admitting: Gastroenterology

## 2013-12-07 ENCOUNTER — Ambulatory Visit (INDEPENDENT_AMBULATORY_CARE_PROVIDER_SITE_OTHER): Payer: Medicare Other | Admitting: *Deleted

## 2013-12-07 DIAGNOSIS — Z23 Encounter for immunization: Secondary | ICD-10-CM

## 2014-02-10 ENCOUNTER — Other Ambulatory Visit: Payer: Self-pay | Admitting: Internal Medicine

## 2014-05-11 DIAGNOSIS — H4011X1 Primary open-angle glaucoma, mild stage: Secondary | ICD-10-CM | POA: Diagnosis not present

## 2014-05-15 ENCOUNTER — Other Ambulatory Visit: Payer: Self-pay | Admitting: Internal Medicine

## 2014-05-18 ENCOUNTER — Telehealth: Payer: Self-pay | Admitting: Internal Medicine

## 2014-05-18 MED ORDER — ALLOPURINOL 100 MG PO TABS: 100.0000 mg | ORAL_TABLET | Freq: Every day | ORAL | Status: DC

## 2014-05-18 NOTE — Telephone Encounter (Signed)
Patient is scheduled for CPX on 05/31/14 and would like to know if allopurinol (ZYLOPRIM) 100 MG tablet can be sent to [id number] - [place], [address].  She would like enough to hold her till her appointment.

## 2014-05-18 NOTE — Telephone Encounter (Signed)
Tried to contact pt no answer. Rx sent to pharmacy.

## 2014-05-28 ENCOUNTER — Other Ambulatory Visit: Payer: Self-pay | Admitting: Internal Medicine

## 2014-05-31 ENCOUNTER — Ambulatory Visit (INDEPENDENT_AMBULATORY_CARE_PROVIDER_SITE_OTHER): Payer: Medicare Other | Admitting: Internal Medicine

## 2014-05-31 ENCOUNTER — Telehealth: Payer: Self-pay | Admitting: Internal Medicine

## 2014-05-31 ENCOUNTER — Encounter: Payer: Self-pay | Admitting: Internal Medicine

## 2014-05-31 VITALS — BP 150/90 | HR 78 | Temp 98.0°F | Resp 20 | Ht 65.0 in | Wt 213.0 lb

## 2014-05-31 DIAGNOSIS — M159 Polyosteoarthritis, unspecified: Secondary | ICD-10-CM

## 2014-05-31 DIAGNOSIS — M150 Primary generalized (osteo)arthritis: Secondary | ICD-10-CM | POA: Diagnosis not present

## 2014-05-31 DIAGNOSIS — I872 Venous insufficiency (chronic) (peripheral): Secondary | ICD-10-CM

## 2014-05-31 DIAGNOSIS — Z86010 Personal history of colonic polyps: Secondary | ICD-10-CM | POA: Diagnosis not present

## 2014-05-31 DIAGNOSIS — E785 Hyperlipidemia, unspecified: Secondary | ICD-10-CM | POA: Diagnosis not present

## 2014-05-31 DIAGNOSIS — I10 Essential (primary) hypertension: Secondary | ICD-10-CM | POA: Diagnosis not present

## 2014-05-31 DIAGNOSIS — Z0000 Encounter for general adult medical examination without abnormal findings: Secondary | ICD-10-CM

## 2014-05-31 LAB — COMPREHENSIVE METABOLIC PANEL
ALT: 13 U/L (ref 0–35)
AST: 14 U/L (ref 0–37)
Albumin: 3.9 g/dL (ref 3.5–5.2)
Alkaline Phosphatase: 80 U/L (ref 39–117)
BUN: 13 mg/dL (ref 6–23)
CO2: 31 meq/L (ref 19–32)
Calcium: 10 mg/dL (ref 8.4–10.5)
Chloride: 105 mEq/L (ref 96–112)
Creatinine, Ser: 0.53 mg/dL (ref 0.40–1.20)
GFR: 118.82 mL/min (ref >60.00)
Glucose, Bld: 89 mg/dL (ref 70–99)
Potassium: 4.5 mEq/L (ref 3.5–5.1)
SODIUM: 142 meq/L (ref 135–145)
TOTAL PROTEIN: 7 g/dL (ref 6.0–8.3)
Total Bilirubin: 1 mg/dL (ref 0.2–1.2)

## 2014-05-31 LAB — CBC WITH DIFFERENTIAL/PLATELET
BASOS PCT: 0.6 % (ref 0.0–3.0)
Basophils Absolute: 0.1 10*3/uL (ref 0.0–0.1)
EOS ABS: 0.4 10*3/uL (ref 0.0–0.7)
Eosinophils Relative: 3.8 % (ref 0.0–5.0)
HCT: 41.9 % (ref 36.0–46.0)
Hemoglobin: 14.2 g/dL (ref 12.0–15.0)
LYMPHS PCT: 30.2 % (ref 12.0–46.0)
Lymphs Abs: 2.9 10*3/uL (ref 0.7–4.0)
MCHC: 33.9 g/dL (ref 30.0–36.0)
MCV: 87.5 fl (ref 78.0–100.0)
Monocytes Absolute: 0.5 10*3/uL (ref 0.1–1.0)
Monocytes Relative: 5.5 % (ref 3.0–12.0)
NEUTROS PCT: 59.9 % (ref 43.0–77.0)
Neutro Abs: 5.7 10*3/uL (ref 1.4–7.7)
PLATELETS: 312 10*3/uL (ref 150.0–400.0)
RBC: 4.79 Mil/uL (ref 3.87–5.11)
RDW: 13.8 % (ref 11.5–15.5)
WBC: 9.5 10*3/uL (ref 4.0–10.5)

## 2014-05-31 LAB — LIPID PANEL
Cholesterol: 158 mg/dL (ref 0–200)
HDL: 52.1 mg/dL (ref >39.00)
LDL Cholesterol: 86 mg/dL (ref 0–99)
NONHDL: 105.9
Total CHOL/HDL Ratio: 3
Triglycerides: 99 mg/dL (ref 0.0–149.0)
VLDL: 19.8 mg/dL (ref 0.0–40.0)

## 2014-05-31 LAB — TSH: TSH: 1.29 u[IU]/mL (ref 0.35–4.50)

## 2014-05-31 MED ORDER — LOSARTAN POTASSIUM-HCTZ 100-12.5 MG PO TABS: 1.0000 | ORAL_TABLET | Freq: Every day | ORAL | Status: DC

## 2014-05-31 MED ORDER — ALLOPURINOL 100 MG PO TABS
100.0000 mg | ORAL_TABLET | Freq: Every day | ORAL | Status: DC
Start: 2014-05-31 — End: 2014-06-17

## 2014-05-31 MED ORDER — CLOBETASOL PROPIONATE 0.05 % EX CREA
TOPICAL_CREAM | Freq: Two times a day (BID) | CUTANEOUS | Status: DC
Start: 2014-05-31 — End: 2015-01-25

## 2014-05-31 MED ORDER — LOSARTAN POTASSIUM 100 MG PO TABS: 100.0000 mg | ORAL_TABLET | Freq: Every day | ORAL | Status: DC

## 2014-05-31 NOTE — Progress Notes (Signed)
Patient ID: Kristina Allison, female   DOB: 05-30-1935, 79 y.o.   MRN: [id number]  Subjective:    Patient ID: Kristina Allison, female    DOB: 01-21-1936, 79 y.o.   MRN: [id number]  Wt Readings from Last 3 Encounters:  05/31/14 213 lb (96.616 kg)  02/02/13 216 lb (97.977 kg)  09/29/12 219 lb (99.338 kg)    Hypertension Pertinent negatives include no chest pain, headaches, palpitations or shortness of breath.  CC: cpx - doing ok.   History of Present Illness:   79   year-old patient who is seen today for a wellness exam.  Medical problems include a history of chronic venous insufficiency and osteoarthritis, involving the right knee. She is followed by orthopedics. She has hypertension. History colonic polyps and exogenous obesity. She has been evaluated for a suspected right C8 radiculopathy. Symptoms have since resolved.  Imaging studies failed to reveal a significant anatomical defect although she did have fairly diffuse osteoarthritic changes  She is evaluated by GYN annually and does obtain annual mammogram; last colonoscopy 2010    Here for Medicare AWV:  1. Risk factors based on Past M, S, F history: Cardiovascular - risk factors include a history of hypertension.  2. Physical Activities: fairly active and cares for a number of children and grandchildren  3. Depression/mood: history of depression or mood disorder, although considerable situational stress  4. Hearing: no hearing impairment;   5. ADL's: independent in all aspects of daily living  6. Fall Risk-Moderate due to obesity and right knee pain 7. Home Safety: no problems identified  8. Height, weight, &visual acuity:height and weight are stable. No change in visual acuity  9. Counseling: heart healthy diet modest weight loss encouraged  10. Labs ordered based on risk factors: laboratory profile, including lipid panel and TSH will be reviewed  11. Referral Coordination- GYN referral  Last year; to return prn.  Orthopedic  follow-up as necessary  12. Care Plan- heart healthy diet, weight loss encouraged  13. Cognitive Assessment- alert and oriented, with normal affect. History of memory dysfunction and also executive functioning without difficulty  14.  Preventive services will include annual clinical examinations with screening lab.  The patient will consider follow-up and final colonoscopy   Patient was provided with a written and personalized care plan  15.  Provider list includes primary care orthopedics.  OB/GYN and GI  as well as ophthalmology Preventive Screening-Counseling & Management  Alcohol-Tobacco  Smoking Status: never   Allergies:  1) ! Sulf-10  2) ! Bactrim Ds (Sulfamethoxazole-Trimethoprim)   Past History:   Colonic polyps, hx of  Hypertension  Osteoarthritis  history of lower extremity DVT January 2009  Fibroids  impaired glucose tolerance  chronic venous insufficiency   Past Surgical History:   Cholecystectomy 1998  Hysterectomy  cataract surgery os  colonoscopy in January 2010  bilateral salpingo-oophorectomy in September 2010   Family History:   Father died age 54, history of Parkinson's disease, senile dementia  mother has senile dementia presently 83 one brother in good health  one sister died of cardiac complications at age 23, history of EtOH   Social History:   cares for a disabled son  4 children, and 3 adopted children. 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  HENT: Negative for congestion, dental problem, ear pain, hearing loss, mouth sores, nosebleeds, sinus pressure, sore throat, tinnitus, trouble swallowing and voice change.   Eyes: Negative for photophobia, pain, redness and visual disturbance.  Respiratory:  Negative for cough, chest tightness and shortness of breath.   Cardiovascular: Negative for chest pain, palpitations and leg swelling.  Gastrointestinal: Negative for nausea, vomiting, abdominal pain, diarrhea, constipation, blood in stool, abdominal distention and rectal pain.  Genitourinary: Negative for dysuria, urgency, frequency, hematuria, flank pain, vaginal bleeding, vaginal discharge, difficulty urinating, genital sores, vaginal pain, menstrual problem and pelvic pain.  Musculoskeletal: Positive for gait problem. Negative for back pain, arthralgias (significant right knee pain. Wears a brace) and neck stiffness.  Skin: Negative for rash.  Neurological: Negative for dizziness, syncope, speech difficulty, weakness, light-headedness, numbness and headaches.  Hematological: Negative for adenopathy. Does not bruise/bleed easily.  Psychiatric/Behavioral: Negative for suicidal ideas, behavioral problems, self-injury, dysphoric mood and agitation. The patient is not nervous/anxious.        Objective:   Physical Exam  Constitutional: She is oriented to person, place, and time. She appears well-developed and well-nourished.  Blood pressure 160/90 Weight 213  HENT:  Head: Normocephalic and atraumatic.  Right Ear: External ear normal.  Left Ear: External ear normal.  Mouth/Throat: Oropharynx is clear and moist.  Eyes: Conjunctivae and EOM are normal.  Neck: Normal range of motion. Neck supple. No JVD present. No thyromegaly present.  Cardiovascular: Normal rate, regular rhythm, normal heart sounds and intact distal pulses.   No murmur heard. Pulmonary/Chest: Effort normal and breath sounds normal. She has no wheezes. She has no rales.  Abdominal: Soft. Bowel sounds are normal. She exhibits no distension and no mass. There is no tenderness. There is no rebound and no guarding.  Genitourinary: Vagina normal.  Musculoskeletal: Normal range of motion. She exhibits no edema or tenderness.   Brace right knee  Neurological: She is alert and oriented to person, place, and time. She has normal reflexes. No cranial nerve deficit. She exhibits normal muscle tone. Coordination normal.  Skin: Skin is warm and dry. No rash noted.  Psychiatric: She has a normal mood and affect. Her behavior is normal.          Assessment & Plan:   Preventive health exam Hypertension- suboptimal control.  Will add hydrochlorothiazide 12 point 5 mg  Osteoarthritis Exogenous obesity Colonic polyps. Burnis Medin check stool for occult blood and check CBC   Low salt diet more regular exercise weight loss all encouraged. We'll reassess in 6 months. That regimen unchanged

## 2014-05-31 NOTE — Telephone Encounter (Signed)
emmi mailed  °

## 2014-05-31 NOTE — Patient Instructions (Signed)
Limit your sodium (Salt) intake  Please check your blood pressure on a regular basis.  If it is consistently greater than 150/90, please make an office appointment.  Return in 6 months for follow-up  Health Maintenance Adopting a healthy lifestyle and getting preventive care can go a long way to promote health and wellness. Talk with your health care provider about what schedule of regular examinations is right for you. This is a good chance for you to check in with your provider about disease prevention and staying healthy. In between checkups, there are plenty of things you can do on your own. Experts have done a lot of research about which lifestyle changes and preventive measures are most likely to keep you healthy. Ask your health care provider for more information. WEIGHT AND DIET  Eat a healthy diet  Be sure to include plenty of vegetables, fruits, low-fat dairy products, and lean protein.  Do not eat a lot of foods high in solid fats, added sugars, or salt.  Get regular exercise. This is one of the most important things you can do for your health.  Most adults should exercise for at least 150 minutes each week. The exercise should increase your heart rate and make you sweat (moderate-intensity exercise).  Most adults should also do strengthening exercises at least twice a week. This is in addition to the moderate-intensity exercise.  Maintain a healthy weight  Body mass index (BMI) is a measurement that can be used to identify possible weight problems. It estimates body fat based on height and weight. Your health care provider can help determine your BMI and help you achieve or maintain a healthy weight.  For females 27 years of age and older:   A BMI below 18.5 is considered underweight.  A BMI of 18.5 to 24.9 is normal.  A BMI of 25 to 29.9 is considered overweight.  A BMI of 30 and above is considered obese.  Watch levels of cholesterol and blood lipids  You should  start having your blood tested for lipids and cholesterol at 77 years of age, then have this test every 5 years.  You may need to have your cholesterol levels checked more often if:  Your lipid or cholesterol levels are high.  You are older than 77 years of age.  You are at high risk for heart disease.  CANCER SCREENING   Lung Cancer  Lung cancer screening is recommended for adults 70-3 years old who are at high risk for lung cancer because of a history of smoking.  A yearly low-dose CT scan of the lungs is recommended for people who:  Currently smoke.  Have quit within the past 15 years.  Have at least a 30-pack-year history of smoking. A pack year is smoking an average of one pack of cigarettes a day for 1 year.  Yearly screening should continue until it has been 15 years since you quit.  Yearly screening should stop if you develop a health problem that would prevent you from having lung cancer treatment.  Breast Cancer  Practice breast self-awareness. This means understanding how your breasts normally appear and feel.  It also means doing regular breast self-exams. Let your health care provider know about any changes, no matter how small.  If you are in your 20s or 30s, you should have a clinical breast exam (CBE) by a health care provider every 1-3 years as part of a regular health exam.  If you are 40 or older, have  a CBE every year. Also consider having a breast X-ray (mammogram) every year.  If you have a family history of breast cancer, talk to your health care provider about genetic screening.  If you are at high risk for breast cancer, talk to your health care provider about having an MRI and a mammogram every year.  Breast cancer gene (BRCA) assessment is recommended for women who have family members with BRCA-related cancers. BRCA-related cancers include:  Breast.  Ovarian.  Tubal.  Peritoneal cancers.  Results of the assessment will determine the  need for genetic counseling and BRCA1 and BRCA2 testing. Cervical Cancer Routine pelvic examinations to screen for cervical cancer are no longer recommended for nonpregnant women who are considered low risk for cancer of the pelvic organs (ovaries, uterus, and vagina) and who do not have symptoms. A pelvic examination may be necessary if you have symptoms including those associated with pelvic infections. Ask your health care provider if a screening pelvic exam is right for you.   The Pap test is the screening test for cervical cancer for women who are considered at risk.  If you had a hysterectomy for a problem that was not cancer or a condition that could lead to cancer, then you no longer need Pap tests.  If you are older than 65 years, and you have had normal Pap tests for the past 10 years, you no longer need to have Pap tests.  If you have had past treatment for cervical cancer or a condition that could lead to cancer, you need Pap tests and screening for cancer for at least 20 years after your treatment.  If you no longer get a Pap test, assess your risk factors if they change (such as having a new sexual partner). This can affect whether you should start being screened again.  Some women have medical problems that increase their chance of getting cervical cancer. If this is the case for you, your health care provider may recommend more frequent screening and Pap tests.  The human papillomavirus (HPV) test is another test that may be used for cervical cancer screening. The HPV test looks for the virus that can cause cell changes in the cervix. The cells collected during the Pap test can be tested for HPV.  The HPV test can be used to screen women 27 years of age and older. Getting tested for HPV can extend the interval between normal Pap tests from three to five years.  An HPV test also should be used to screen women of any age who have unclear Pap test results.  After 77 years of age,  women should have HPV testing as often as Pap tests.  Colorectal Cancer  This type of cancer can be detected and often prevented.  Routine colorectal cancer screening usually begins at 77 years of age and continues through 77 years of age.  Your health care provider may recommend screening at an earlier age if you have risk factors for colon cancer.  Your health care provider may also recommend using home test kits to check for hidden blood in the stool.  A small camera at the end of a tube can be used to examine your colon directly (sigmoidoscopy or colonoscopy). This is done to check for the earliest forms of colorectal cancer.  Routine screening usually begins at age 70.  Direct examination of the colon should be repeated every 5-10 years through 77 years of age. However, you may need to be screened more  often if early forms of precancerous polyps or small growths are found. Skin Cancer  Check your skin from head to toe regularly.  Tell your health care provider about any new moles or changes in moles, especially if there is a change in a mole's shape or color.  Also tell your health care provider if you have a mole that is larger than the size of a pencil eraser.  Always use sunscreen. Apply sunscreen liberally and repeatedly throughout the day.  Protect yourself by wearing long sleeves, pants, a wide-brimmed hat, and sunglasses whenever you are outside. HEART DISEASE, DIABETES, AND HIGH BLOOD PRESSURE   Have your blood pressure checked at least every 1-2 years. High blood pressure causes heart disease and increases the risk of stroke.  If you are between 72 years and 74 years old, ask your health care provider if you should take aspirin to prevent strokes.  Have regular diabetes screenings. This involves taking a blood sample to check your fasting blood sugar level.  If you are at a normal weight and have a low risk for diabetes, have this test once every three years after 77  years of age.  If you are overweight and have a high risk for diabetes, consider being tested at a younger age or more often. PREVENTING INFECTION  Hepatitis B  If you have a higher risk for hepatitis B, you should be screened for this virus. You are considered at high risk for hepatitis B if:  You were born in a country where hepatitis B is common. Ask your health care provider which countries are considered high risk.  Your parents were born in a high-risk country, and you have not been immunized against hepatitis B (hepatitis B vaccine).  You have HIV or AIDS.  You use needles to inject street drugs.  You live with someone who has hepatitis B.  You have had sex with someone who has hepatitis B.  You get hemodialysis treatment.  You take certain medicines for conditions, including cancer, organ transplantation, and autoimmune conditions. Hepatitis C  Blood testing is recommended for:  Everyone born from [birth year] through 1963.  Anyone with known risk factors for hepatitis C. Sexually transmitted infections (STIs)  You should be screened for sexually transmitted infections (STIs) including gonorrhea and chlamydia if:  You are sexually active and are younger than 77 years of age.  You are older than 77 years of age and your health care provider tells you that you are at risk for this type of infection.  Your sexual activity has changed since you were last screened and you are at an increased risk for chlamydia or gonorrhea. Ask your health care provider if you are at risk.  If you do not have HIV, but are at risk, it may be recommended that you take a prescription medicine daily to prevent HIV infection. This is called pre-exposure prophylaxis (PrEP). You are considered at risk if:  You are sexually active and do not regularly use condoms or know the HIV status of your partner(s).  You take drugs by injection.  You are sexually active with a partner who has HIV. Talk with  your health care provider about whether you are at high risk of being infected with HIV. If you choose to begin PrEP, you should first be tested for HIV. You should then be tested every 3 months for as long as you are taking PrEP.  PREGNANCY   If you are premenopausal and you may become  pregnant, ask your health care provider about preconception counseling.  If you may become pregnant, take 400 to 800 micrograms (mcg) of folic acid every day.  If you want to prevent pregnancy, talk to your health care provider about birth control (contraception). OSTEOPOROSIS AND MENOPAUSE   Osteoporosis is a disease in which the bones lose minerals and strength with aging. This can result in serious bone fractures. Your risk for osteoporosis can be identified using a bone density scan.  If you are 42 years of age or older, or if you are at risk for osteoporosis and fractures, ask your health care provider if you should be screened.  Ask your health care provider whether you should take a calcium or vitamin D supplement to lower your risk for osteoporosis.  Menopause may have certain physical symptoms and risks.  Hormone replacement therapy may reduce some of these symptoms and risks. Talk to your health care provider about whether hormone replacement therapy is right for you.  HOME CARE INSTRUCTIONS   Schedule regular health, dental, and eye exams.  Stay current with your immunizations.   Do not use any tobacco products including cigarettes, chewing tobacco, or electronic cigarettes.  If you are pregnant, do not drink alcohol.  If you are breastfeeding, limit how much and how often you drink alcohol.  Limit alcohol intake to no more than 1 drink per day for nonpregnant women. One drink equals 12 ounces of beer, 5 ounces of wine, or 1 ounces of hard liquor.  Do not use street drugs.  Do not share needles.  Ask your health care provider for help if you need support or information about quitting  drugs.  Tell your health care provider if you often feel depressed.  Tell your health care provider if you have ever been abused or do not feel safe at home. Document Released: 08/28/2010 Document Revised: 06/29/2013 Document Reviewed: 01/14/2013 [facility] Patient Information 2015 [place], [place]. This information is not intended to replace advice given to you by your health care provider. Make sure you discuss any questions you have with your health care provider.

## 2014-05-31 NOTE — Progress Notes (Signed)
Pre visit review using our clinic review tool, if applicable. No additional management support is needed unless otherwise documented below in the visit note. 

## 2014-06-08 ENCOUNTER — Other Ambulatory Visit (INDEPENDENT_AMBULATORY_CARE_PROVIDER_SITE_OTHER): Payer: Medicare Other

## 2014-06-08 DIAGNOSIS — Z86010 Personal history of colonic polyps: Secondary | ICD-10-CM | POA: Diagnosis not present

## 2014-06-08 LAB — HEMOCCULT GUIAC POC 1CARD (OFFICE)
Card #2 Fecal Occult Blod, POC: NEGATIVE
Fecal Occult Blood, POC: NEGATIVE

## 2014-06-10 ENCOUNTER — Telehealth: Payer: Self-pay | Admitting: Internal Medicine

## 2014-06-10 NOTE — Telephone Encounter (Signed)
Pt called back, said she re checked her blood pressure this afternoon and it was 143/78. Told pt to monitor blood pressure through the weekend and if it continues to be elevated to call office on Monday for an appointment. Pt verbalized understanding.

## 2014-06-10 NOTE — Telephone Encounter (Signed)
Left message with husband to call office. 

## 2014-06-10 NOTE — Telephone Encounter (Signed)
Pt said she went to [facility] today and she checked her BP while there and it was 182/78 . She said when she was in her for physical he change her bp medicine. She is asking if she need an appt or should she wait. Please call pt

## 2014-06-17 ENCOUNTER — Ambulatory Visit (INDEPENDENT_AMBULATORY_CARE_PROVIDER_SITE_OTHER): Payer: Medicare Other | Admitting: Family Medicine

## 2014-06-17 ENCOUNTER — Encounter: Payer: Self-pay | Admitting: Family Medicine

## 2014-06-17 VITALS — BP 150/80 | HR 91 | Temp 98.1°F | Wt 212.0 lb

## 2014-06-17 DIAGNOSIS — I10 Essential (primary) hypertension: Secondary | ICD-10-CM | POA: Diagnosis not present

## 2014-06-17 MED ORDER — AMLODIPINE BESYLATE 2.5 MG PO TABS: 2.5000 mg | ORAL_TABLET | Freq: Every day | ORAL | Status: DC

## 2014-06-17 MED ORDER — ALLOPURINOL 100 MG PO TABS: 100.0000 mg | ORAL_TABLET | Freq: Every day | ORAL | Status: DC

## 2014-06-17 NOTE — Patient Instructions (Signed)
DASH Eating Plan °DASH stands for "Dietary Approaches to Stop Hypertension." The DASH eating plan is a healthy eating plan that has been shown to reduce high blood pressure (hypertension). Additional health benefits may include reducing the risk of type 2 diabetes mellitus, heart disease, and stroke. The DASH eating plan may also help with weight loss. °WHAT DO I NEED TO KNOW ABOUT THE DASH EATING PLAN? °For the DASH eating plan, you will follow these general guidelines: °· Choose foods with a percent daily value for sodium of less than 5% (as listed on the food label). °· Use salt-free seasonings or herbs instead of table salt or sea salt. °· Check with your health care provider or pharmacist before using salt substitutes. °· Eat lower-sodium products, often labeled as "lower sodium" or "no salt added." °· Eat fresh foods. °· Eat more vegetables, fruits, and low-fat dairy products. °· Choose whole grains. Look for the word "whole" as the first word in the ingredient list. °· Choose fish and skinless chicken or turkey more often than red meat. Limit fish, poultry, and meat to 6 oz (170 g) each day. °· Limit sweets, desserts, sugars, and sugary drinks. °· Choose heart-healthy fats. °· Limit cheese to 1 oz (28 g) per day. °· Eat more home-cooked food and less restaurant, buffet, and fast food. °· Limit fried foods. °· Cook foods using methods other than frying. °· Limit canned vegetables. If you do use them, rinse them well to decrease the sodium. °· When eating at a restaurant, ask that your food be prepared with less salt, or no salt if possible. °WHAT FOODS CAN I EAT? °Seek help from a dietitian for individual calorie needs. °Grains °Whole grain or whole wheat bread. Camera rice. Whole grain or whole wheat pasta. Quinoa, bulgur, and whole grain cereals. Low-sodium cereals. Corn or whole wheat flour tortillas. Whole grain cornbread. Whole grain crackers. Low-sodium crackers. °Vegetables °Fresh or frozen vegetables  (raw, steamed, roasted, or grilled). Low-sodium or reduced-sodium tomato and vegetable juices. Low-sodium or reduced-sodium tomato sauce and paste. Low-sodium or reduced-sodium canned vegetables.  °Fruits °All fresh, canned (in natural juice), or frozen fruits. °Meat and Other Protein Products °Ground beef (85% or leaner), grass-fed beef, or beef trimmed of fat. Skinless chicken or turkey. Ground chicken or turkey. Pork trimmed of fat. All fish and seafood. Eggs. Dried beans, peas, or lentils. Unsalted nuts and seeds. Unsalted canned beans. °Dairy °Low-fat dairy products, such as skim or 1% milk, 2% or reduced-fat cheeses, low-fat ricotta or cottage cheese, or plain low-fat yogurt. Low-sodium or reduced-sodium cheeses. °Fats and Oils °Tub margarines without trans fats. Light or reduced-fat mayonnaise and salad dressings (reduced sodium). Avocado. Safflower, olive, or canola oils. Natural peanut or almond butter. °Other °Unsalted popcorn and pretzels. °The items listed above may not be a complete list of recommended foods or beverages. Contact your dietitian for more options. °WHAT FOODS ARE NOT RECOMMENDED? °Grains °White bread. White pasta. White rice. Refined cornbread. Bagels and croissants. Crackers that contain trans fat. °Vegetables °Creamed or fried vegetables. Vegetables in a cheese sauce. Regular canned vegetables. Regular canned tomato sauce and paste. Regular tomato and vegetable juices. °Fruits °Dried fruits. Canned fruit in light or heavy syrup. Fruit juice. °Meat and Other Protein Products °Fatty cuts of meat. Ribs, chicken wings, bacon, sausage, bologna, salami, chitterlings, fatback, hot dogs, bratwurst, and packaged luncheon meats. Salted nuts and seeds. Canned beans with salt. °Dairy °Whole or 2% milk, cream, half-and-half, and cream cheese. Whole-fat or sweetened yogurt. Full-fat   cheeses or blue cheese. Nondairy creamers and whipped toppings. Processed cheese, cheese spreads, or cheese  curds. °Condiments °Onion and garlic salt, seasoned salt, table salt, and sea salt. Canned and packaged gravies. Worcestershire sauce. Tartar sauce. Barbecue sauce. Teriyaki sauce. Soy sauce, including reduced sodium. Steak sauce. Fish sauce. Oyster sauce. Cocktail sauce. Horseradish. Ketchup and mustard. Meat flavorings and tenderizers. Bouillon cubes. Hot sauce. Tabasco sauce. Marinades. Taco seasonings. Relishes. °Fats and Oils °Butter, stick margarine, lard, shortening, ghee, and bacon fat. Coconut, palm kernel, or palm oils. Regular salad dressings. °Other °Pickles and olives. Salted popcorn and pretzels. °The items listed above may not be a complete list of foods and beverages to avoid. Contact your dietitian for more information. °WHERE CAN I FIND MORE INFORMATION? °National [employer], [employer], [employer]: www.nhlbi.nih.gov/health/health-topics/topics/dash/ °Document Released: 02/01/2011 Document Revised: 06/29/2013 Document Reviewed: 12/17/2012 °[employer]® Patient Information ©2015 [employer]. This information is not intended to replace advice given to you by your health care provider. Make sure you discuss any questions you have with your health care provider. ° °

## 2014-06-17 NOTE — Progress Notes (Signed)
   Subjective:    Patient ID: Kristina Allison, female    DOB: Mar 03, 1935, 79 y.o.   MRN: [id number]  HPI Patient seen for follow-up hypertension and absence of her primary provider. Recently changed from losartan to losartan HCTZ. She has tolerated well but has had several very high readings over the past couple weeks at local drug store. Highest reading 196/96. She has had several blood pressure readings in the 449Q systolic. No headaches. No dizziness. No chest pains. No increased peripheral edema.  Past Medical History  Diagnosis Date  . COLONIC POLYPS, HX OF 11/28/2006  . DVT 03/10/2007  . Edema 02/21/2007  . ESOPHAGITIS NOS 09/27/2006  . GOUT 06/04/2007  . HIP PAIN, LEFT 01/30/2007  . HYPERTENSION 11/28/2006  . OBESITY NOS 09/27/2006  . OSTEOARTHRITIS 11/28/2006  . OSTEOARTHROSIS, GENERALIZED, UNSPC SITE 09/27/2006  . PHLEBITIS&THROMBOPHLEBITIS LOWER EXTREM UNSPEC 03/07/2007  . STATE, SYMPTOMATIC MENOPAUSE/FEM CLIMACTERIC 09/27/2006  . VENOUS INSUFFICIENCY, CHRONIC, RIGHT LEG 09/06/2009   No past surgical history on file.  reports that she has never smoked. She has never used smokeless tobacco. She reports that she does not drink alcohol or use illicit drugs. family history is not on file. Allergies  Allergen Reactions  . Sulfacetamide Sodium Itching and Rash  . Sulfamethoxazole-Trimethoprim Itching and Rash      Review of Systems  Constitutional: Negative for fatigue.  Eyes: Negative for visual disturbance.  Respiratory: Negative for cough, chest tightness, shortness of breath and wheezing.   Cardiovascular: Negative for chest pain, palpitations and leg swelling.  Neurological: Negative for dizziness, seizures, syncope, weakness, light-headedness and headaches.       Objective:   Physical Exam  Constitutional: She appears well-developed and well-nourished.  Neck: No JVD present.  Cardiovascular: Normal rate and regular rhythm.   Pulmonary/Chest: Effort normal and breath sounds  normal. No respiratory distress. She has no wheezes. She has no rales.          Assessment & Plan:  Hypertension. Suboptimal control by several readings outside of here and repeat by me today left arm seated 150/80. Information on DASH diet given. Add amlodipine 2.5 mg once daily. Schedule follow-up with primary in one month to reassess. Work on weight loss.

## 2014-06-17 NOTE — Progress Notes (Signed)
Pre visit review using our clinic review tool, if applicable. No additional management support is needed unless otherwise documented below in the visit note. 

## 2014-07-19 ENCOUNTER — Ambulatory Visit (INDEPENDENT_AMBULATORY_CARE_PROVIDER_SITE_OTHER): Payer: Medicare Other | Admitting: Internal Medicine

## 2014-07-19 ENCOUNTER — Ambulatory Visit: Payer: Medicare Other | Admitting: Internal Medicine

## 2014-07-19 ENCOUNTER — Encounter: Payer: Self-pay | Admitting: Internal Medicine

## 2014-07-19 VITALS — BP 124/60 | HR 84 | Temp 98.1°F | Resp 20 | Ht 65.0 in | Wt 215.0 lb

## 2014-07-19 DIAGNOSIS — I10 Essential (primary) hypertension: Secondary | ICD-10-CM | POA: Diagnosis not present

## 2014-07-19 DIAGNOSIS — I872 Venous insufficiency (chronic) (peripheral): Secondary | ICD-10-CM

## 2014-07-19 NOTE — Patient Instructions (Signed)

## 2014-07-19 NOTE — Progress Notes (Signed)
Subjective:    Patient ID: Kristina Allison, female    DOB: [birth year], 79 y.o.   MRN: [id number]  HPI  BP Readings from Last 3 Encounters:  07/19/14 140/70  06/17/14 150/80  05/31/14 16/39     79 year old patient who is seen today for follow-up of hypertension.  Amlodipine 2.5 milligrams added to her regimen about 1 month ago.  She has tolerated this well without headaches or peripheral edema.  No concerns or complaints  Past Medical History  Diagnosis Date  . COLONIC POLYPS, HX OF 11/28/2006  . DVT 03/10/2007  . Edema 02/21/2007  . ESOPHAGITIS NOS 09/27/2006  . GOUT 06/04/2007  . HIP PAIN, LEFT 01/30/2007  . HYPERTENSION 11/28/2006  . OBESITY NOS 09/27/2006  . OSTEOARTHRITIS 11/28/2006  . OSTEOARTHROSIS, GENERALIZED, UNSPC SITE 09/27/2006  . PHLEBITIS&THROMBOPHLEBITIS LOWER EXTREM UNSPEC 03/07/2007  . STATE, SYMPTOMATIC MENOPAUSE/FEM CLIMACTERIC 09/27/2006  . VENOUS INSUFFICIENCY, CHRONIC, RIGHT LEG 09/06/2009    History   Social History  . Marital Status: Married    Spouse Name: N/A  . Number of Children: N/A  . Years of Education: N/A   Occupational History  . Not on file.   Social History Main Topics  . Smoking status: Never Smoker   . Smokeless tobacco: Never Used  . Alcohol Use: No  . Drug Use: No  . Sexual Activity: Not on file   Other Topics Concern  . Not on file   Social History Narrative    No past surgical history on file.  No family history on file.  Allergies  Allergen Reactions  . Sulfacetamide Sodium Itching and Rash  . Sulfamethoxazole-Trimethoprim Itching and Rash    Current Outpatient Prescriptions on File Prior to Visit  Medication Sig Dispense Refill  . allopurinol (ZYLOPRIM) 100 MG tablet Take 1 tablet (100 mg total) by mouth daily. 30 tablet 11  . amLODipine (NORVASC) 2.5 MG tablet Take 1 tablet (2.5 mg total) by mouth daily. 30 tablet 6  . clobetasol cream (TEMOVATE) 0.05 % Apply topically 2 (two) times daily. 30 g 2  . latanoprost  (XALATAN) 0.005 % ophthalmic solution Place 1 drop into both eyes at bedtime.    Marland Kitchen losartan-hydrochlorothiazide (HYZAAR) 100-12.5 MG per tablet Take 1 tablet by mouth daily. 90 tablet 3   No current facility-administered medications on file prior to visit.    BP 140/70 mmHg  Pulse 84  Temp(Src) 98.1 F (36.7 C) (Oral)  Resp 20  Ht 5\' 5"  (1.651 m)  Wt 215 lb (97.523 kg)  BMI 35.78 kg/m2  SpO2 97%     Review of Systems  Constitutional: Negative.   HENT: Negative for congestion, dental problem, hearing loss, rhinorrhea, sinus pressure, sore throat and tinnitus.   Eyes: Negative for pain, discharge and visual disturbance.  Respiratory: Negative for cough and shortness of breath.   Cardiovascular: Negative for chest pain, palpitations and leg swelling.  Gastrointestinal: Negative for nausea, vomiting, abdominal pain, diarrhea, constipation, blood in stool and abdominal distention.  Genitourinary: Negative for dysuria, urgency, frequency, hematuria, flank pain, vaginal bleeding, vaginal discharge, difficulty urinating, vaginal pain and pelvic pain.  Musculoskeletal: Negative for joint swelling, arthralgias and gait problem.  Skin: Negative for rash.  Neurological: Negative for dizziness, syncope, speech difficulty, weakness, numbness and headaches.  Hematological: Negative for adenopathy.  Psychiatric/Behavioral: Negative for behavioral problems, dysphoric mood and agitation. The patient is not nervous/anxious.        Objective:   Physical Exam  Constitutional: She is oriented to person,  place, and time. She appears well-developed and well-nourished.  Repeat blood pressure 124/60 both arms  HENT:  Head: Normocephalic.  Right Ear: External ear normal.  Left Ear: External ear normal.  Mouth/Throat: Oropharynx is clear and moist.  Eyes: Conjunctivae and EOM are normal. Pupils are equal, round, and reactive to light.  Neck: Normal range of motion. Neck supple. No thyromegaly  present.  Cardiovascular: Normal rate, regular rhythm, normal heart sounds and intact distal pulses.   Pulmonary/Chest: Effort normal and breath sounds normal.  Abdominal: Soft. Bowel sounds are normal. She exhibits no mass. There is no tenderness.  Musculoskeletal: Normal range of motion.  Lymphadenopathy:    She has no cervical adenopathy.  Neurological: She is alert and oriented to person, place, and time.  Skin: Skin is warm and dry. No rash noted.  Psychiatric: She has a normal mood and affect. Her behavior is normal.          Assessment & Plan:   Hypertension, well-controlled.  We'll continue low-salt diet.  Attempts at weight loss.  Home blood pressure monitoring recommended  Recheck 6 months

## 2014-07-19 NOTE — Progress Notes (Signed)
Pre visit review using our clinic review tool, if applicable. No additional management support is needed unless otherwise documented below in the visit note. 

## 2014-07-19 NOTE — Progress Notes (Unsigned)
   Subjective:    Patient ID: Kristina Allison, female    DOB: 01/11/1936, 79 y.o.   MRN: [id number]  HPI    Review of Systems     Objective:   Physical Exam        Assessment & Plan:

## 2014-07-21 DIAGNOSIS — Z1231 Encounter for screening mammogram for malignant neoplasm of breast: Secondary | ICD-10-CM | POA: Diagnosis not present

## 2014-07-21 DIAGNOSIS — Z803 Family history of malignant neoplasm of breast: Secondary | ICD-10-CM | POA: Diagnosis not present

## 2014-07-21 LAB — HM MAMMOGRAPHY: HM MAMMO: NEGATIVE

## 2014-08-04 ENCOUNTER — Encounter: Payer: Self-pay | Admitting: Internal Medicine

## 2014-11-09 DIAGNOSIS — H4011X1 Primary open-angle glaucoma, mild stage: Secondary | ICD-10-CM | POA: Diagnosis not present

## 2014-11-30 ENCOUNTER — Other Ambulatory Visit: Payer: Medicare Other | Admitting: Internal Medicine

## 2014-12-23 ENCOUNTER — Ambulatory Visit (INDEPENDENT_AMBULATORY_CARE_PROVIDER_SITE_OTHER): Payer: Medicare Other | Admitting: *Deleted

## 2014-12-23 DIAGNOSIS — Z23 Encounter for immunization: Secondary | ICD-10-CM | POA: Diagnosis not present

## 2015-01-05 ENCOUNTER — Ambulatory Visit: Payer: Medicare Other | Admitting: Internal Medicine

## 2015-01-19 ENCOUNTER — Other Ambulatory Visit: Payer: Self-pay | Admitting: Family Medicine

## 2015-01-25 ENCOUNTER — Encounter: Payer: Self-pay | Admitting: Internal Medicine

## 2015-01-25 ENCOUNTER — Ambulatory Visit (INDEPENDENT_AMBULATORY_CARE_PROVIDER_SITE_OTHER): Payer: Medicare Other | Admitting: Internal Medicine

## 2015-01-25 VITALS — BP 132/70 | HR 74 | Temp 98.4°F | Resp 20 | Ht 65.0 in | Wt 217.0 lb

## 2015-01-25 DIAGNOSIS — M109 Gout, unspecified: Secondary | ICD-10-CM | POA: Diagnosis not present

## 2015-01-25 DIAGNOSIS — Z86010 Personal history of colonic polyps: Secondary | ICD-10-CM

## 2015-01-25 DIAGNOSIS — I10 Essential (primary) hypertension: Secondary | ICD-10-CM

## 2015-01-25 MED ORDER — CLOBETASOL PROPIONATE 0.05 % EX CREA: TOPICAL_CREAM | Freq: Two times a day (BID) | CUTANEOUS | Status: DC

## 2015-01-25 NOTE — Patient Instructions (Signed)
Limit your sodium (Salt) intake    It is important that you exercise regularly, at least 20 minutes 3 to 4 times per week.  If you develop chest pain or shortness of breath seek  medical attention.  Please check your blood pressure on a regular basis.  If it is consistently greater than 150/90, please make an office appointment.  Return in 6 months for follow-up   

## 2015-01-25 NOTE — Progress Notes (Signed)
Pre visit review using our clinic review tool, if applicable. No additional management support is needed unless otherwise documented below in the visit note. 

## 2015-01-25 NOTE — Progress Notes (Signed)
Subjective:    Patient ID: Kristina Allison, female    DOB: 1935-04-28, 79 y.o.   MRN: [id number]  HPI 79 year old patient who is seen today for her biannual follow-up.  She has a history of essential hypertension, obesity, remote history of gout and osteoarthritis. Doing well but over extended, taking care of disabled children She has venous insufficiency which has been stable  Past Medical History  Diagnosis Date  . COLONIC POLYPS, HX OF 11/28/2006  . DVT 03/10/2007  . Edema 02/21/2007  . ESOPHAGITIS NOS 09/27/2006  . GOUT 06/04/2007  . HIP PAIN, LEFT 01/30/2007  . HYPERTENSION 11/28/2006  . OBESITY NOS 09/27/2006  . OSTEOARTHRITIS 11/28/2006  . OSTEOARTHROSIS, GENERALIZED, UNSPC SITE 09/27/2006  . PHLEBITIS&THROMBOPHLEBITIS LOWER EXTREM UNSPEC 03/07/2007  . STATE, SYMPTOMATIC MENOPAUSE/FEM CLIMACTERIC 09/27/2006  . VENOUS INSUFFICIENCY, CHRONIC, RIGHT LEG 09/06/2009    Social History   Social History  . Marital Status: Married    Spouse Name: N/A  . Number of Children: N/A  . Years of Education: N/A   Occupational History  . Not on file.   Social History Main Topics  . Smoking status: Never Smoker   . Smokeless tobacco: Never Used  . Alcohol Use: No  . Drug Use: No  . Sexual Activity: Not on file   Other Topics Concern  . Not on file   Social History Narrative    No past surgical history on file.  No family history on file.  Allergies  Allergen Reactions  . Sulfacetamide Sodium Itching and Rash  . Sulfamethoxazole-Trimethoprim Itching and Rash    Current Outpatient Prescriptions on File Prior to Visit  Medication Sig Dispense Refill  . allopurinol (ZYLOPRIM) 100 MG tablet Take 1 tablet (100 mg total) by mouth daily. 30 tablet 11  . amLODipine (NORVASC) 2.5 MG tablet TAKE 1 TABLET(2.5 MG) BY MOUTH DAILY 30 tablet 11  . latanoprost (XALATAN) 0.005 % ophthalmic solution Place 1 drop into both eyes at bedtime.    Marland Kitchen losartan-hydrochlorothiazide (HYZAAR) 100-12.5 MG per  tablet Take 1 tablet by mouth daily. 90 tablet 3   No current facility-administered medications on file prior to visit.    BP 132/70 mmHg  Pulse 74  Temp(Src) 98.4 F (36.9 C) (Oral)  Resp 20  Ht 5\' 5"  (1.651 m)  Wt 217 lb (98.431 kg)  BMI 36.11 kg/m2  SpO2 96%      Review of Systems  Constitutional: Positive for fatigue.  HENT: Negative for congestion, dental problem, hearing loss, rhinorrhea, sinus pressure, sore throat and tinnitus.   Eyes: Negative for pain, discharge and visual disturbance.  Respiratory: Negative for cough and shortness of breath.   Cardiovascular: Negative for chest pain, palpitations and leg swelling.  Gastrointestinal: Negative for nausea, vomiting, abdominal pain, diarrhea, constipation, blood in stool and abdominal distention.  Genitourinary: Negative for dysuria, urgency, frequency, hematuria, flank pain, vaginal bleeding, vaginal discharge, difficulty urinating, vaginal pain and pelvic pain.  Musculoskeletal: Positive for arthralgias. Negative for joint swelling and gait problem.  Skin: Negative for rash.  Neurological: Negative for dizziness, syncope, speech difficulty, weakness, numbness and headaches.  Hematological: Negative for adenopathy.  Psychiatric/Behavioral: Negative for behavioral problems, dysphoric mood and agitation. The patient is not nervous/anxious.        Objective:   Physical Exam  Constitutional: She is oriented to person, place, and time. She appears well-developed and well-nourished.  Blood pressure 130/70  HENT:  Head: Normocephalic.  Right Ear: External ear normal.  Left Ear: External ear  normal.  Mouth/Throat: Oropharynx is clear and moist.  Eyes: Conjunctivae and EOM are normal. Pupils are equal, round, and reactive to light.  Neck: Normal range of motion. Neck supple. No thyromegaly present.  Cardiovascular: Normal rate, regular rhythm, normal heart sounds and intact distal pulses.   Pulmonary/Chest: Effort normal  and breath sounds normal.  Abdominal: Soft. Bowel sounds are normal. She exhibits no mass. There is no tenderness.  Musculoskeletal: Normal range of motion.  Lymphadenopathy:    She has no cervical adenopathy.  Neurological: She is alert and oriented to person, place, and time.  Skin: Skin is warm and dry. No rash noted.  Psychiatric: She has a normal mood and affect. Her behavior is normal.          Assessment & Plan:     Hypertension, well-controlled Obesity Osteoarthritis  No change in therapy CPX 6 months

## 2015-05-11 DIAGNOSIS — H401131 Primary open-angle glaucoma, bilateral, mild stage: Secondary | ICD-10-CM | POA: Diagnosis not present

## 2015-05-20 ENCOUNTER — Other Ambulatory Visit: Payer: Self-pay | Admitting: Internal Medicine

## 2015-07-20 ENCOUNTER — Other Ambulatory Visit (INDEPENDENT_AMBULATORY_CARE_PROVIDER_SITE_OTHER): Payer: PPO

## 2015-07-20 DIAGNOSIS — Z0000 Encounter for general adult medical examination without abnormal findings: Secondary | ICD-10-CM | POA: Diagnosis not present

## 2015-07-20 LAB — HEPATIC FUNCTION PANEL
ALT: 15 U/L (ref 0–35)
AST: 16 U/L (ref 0–37)
Albumin: 3.8 g/dL (ref 3.5–5.2)
Alkaline Phosphatase: 65 U/L (ref 39–117)
BILIRUBIN DIRECT: 0.1 mg/dL (ref 0.0–0.3)
BILIRUBIN TOTAL: 0.8 mg/dL (ref 0.2–1.2)
Total Protein: 6.6 g/dL (ref 6.0–8.3)

## 2015-07-20 LAB — CBC WITH DIFFERENTIAL/PLATELET
BASOS PCT: 0.8 % (ref 0.0–3.0)
Basophils Absolute: 0.1 10*3/uL (ref 0.0–0.1)
EOS ABS: 0.4 10*3/uL (ref 0.0–0.7)
EOS PCT: 4.9 % (ref 0.0–5.0)
HCT: 38.3 % (ref 36.0–46.0)
HEMOGLOBIN: 12.9 g/dL (ref 12.0–15.0)
LYMPHS ABS: 2.5 10*3/uL (ref 0.7–4.0)
Lymphocytes Relative: 30.7 % (ref 12.0–46.0)
MCHC: 33.7 g/dL (ref 30.0–36.0)
MCV: 87.5 fl (ref 78.0–100.0)
MONO ABS: 0.5 10*3/uL (ref 0.1–1.0)
Monocytes Relative: 6.7 % (ref 3.0–12.0)
NEUTROS PCT: 56.9 % (ref 43.0–77.0)
Neutro Abs: 4.6 10*3/uL (ref 1.4–7.7)
Platelets: 343 10*3/uL (ref 150.0–400.0)
RBC: 4.38 Mil/uL (ref 3.87–5.11)
RDW: 13.8 % (ref 11.5–15.5)
WBC: 8 10*3/uL (ref 4.0–10.5)

## 2015-07-20 LAB — LIPID PANEL
CHOL/HDL RATIO: 4
CHOLESTEROL: 139 mg/dL (ref 0–200)
HDL: 36.7 mg/dL — AB (ref >39.00)
LDL CALC: 87 mg/dL (ref 0–99)
NonHDL: 102.12
TRIGLYCERIDES: 76 mg/dL (ref 0.0–149.0)
VLDL: 15.2 mg/dL (ref 0.0–40.0)

## 2015-07-20 LAB — BASIC METABOLIC PANEL
BUN: 21 mg/dL (ref 6–23)
CALCIUM: 9.3 mg/dL (ref 8.4–10.5)
CO2: 33 meq/L — AB (ref 19–32)
CREATININE: 0.56 mg/dL (ref 0.40–1.20)
Chloride: 104 mEq/L (ref 96–112)
GFR: 111.17 mL/min (ref >60.00)
GLUCOSE: 119 mg/dL — AB (ref 70–99)
Potassium: 3.2 mEq/L — ABNORMAL LOW (ref 3.5–5.1)
SODIUM: 143 meq/L (ref 135–145)

## 2015-07-20 LAB — POC URINALSYSI DIPSTICK (AUTOMATED)
GLUCOSE UA: NEGATIVE
KETONES UA: NEGATIVE
Nitrite, UA: NEGATIVE
Urobilinogen, UA: 1
pH, UA: 6

## 2015-07-20 LAB — TSH: TSH: 1.06 u[IU]/mL (ref 0.35–4.50)

## 2015-07-22 ENCOUNTER — Other Ambulatory Visit: Payer: Self-pay | Admitting: *Deleted

## 2015-07-22 DIAGNOSIS — Z1231 Encounter for screening mammogram for malignant neoplasm of breast: Secondary | ICD-10-CM | POA: Diagnosis not present

## 2015-07-22 DIAGNOSIS — Z803 Family history of malignant neoplasm of breast: Secondary | ICD-10-CM | POA: Diagnosis not present

## 2015-07-22 LAB — HM MAMMOGRAPHY

## 2015-07-22 MED ORDER — POTASSIUM CHLORIDE CRYS ER 20 MEQ PO TBCR: 20.0000 meq | EXTENDED_RELEASE_TABLET | Freq: Every day | ORAL | Status: DC

## 2015-07-27 ENCOUNTER — Encounter: Payer: Self-pay | Admitting: Internal Medicine

## 2015-07-27 ENCOUNTER — Ambulatory Visit (INDEPENDENT_AMBULATORY_CARE_PROVIDER_SITE_OTHER): Payer: PPO | Admitting: Internal Medicine

## 2015-07-27 VITALS — BP 124/70 | HR 79 | Temp 98.4°F | Resp 20 | Ht 65.0 in | Wt 216.0 lb

## 2015-07-27 DIAGNOSIS — I10 Essential (primary) hypertension: Secondary | ICD-10-CM

## 2015-07-27 DIAGNOSIS — M159 Polyosteoarthritis, unspecified: Secondary | ICD-10-CM

## 2015-07-27 DIAGNOSIS — M150 Primary generalized (osteo)arthritis: Secondary | ICD-10-CM

## 2015-07-27 DIAGNOSIS — Z86010 Personal history of colonic polyps: Secondary | ICD-10-CM | POA: Diagnosis not present

## 2015-07-27 DIAGNOSIS — Z0000 Encounter for general adult medical examination without abnormal findings: Secondary | ICD-10-CM | POA: Diagnosis not present

## 2015-07-27 NOTE — Patient Instructions (Signed)
Limit your sodium (Salt) intake  You need to lose weight.  Consider a lower calorie diet and regular exercise.    It is important that you exercise regularly, at least 20 minutes 3 to 4 times per week.  If you develop chest pain or shortness of breath seek  medical attention.  Return in 6 months for follow-up

## 2015-07-27 NOTE — Progress Notes (Signed)
Patient ID: Kristina Allison, female   DOB: November 05, 1935, 80 y.o.   MRN: [id number]  Subjective:    Patient ID: Kristina Allison, female    DOB: Dec 17, 1935, 80 y.o.   MRN: [id number]  Wt Readings from Last 3 Encounters:  07/27/15 216 lb (97.977 kg)  01/25/15 217 lb (98.431 kg)  07/19/14 215 lb (97.523 kg)    Hypertension Associated symptoms include shortness of breath. Pertinent negatives include no chest pain, headaches or palpitations.  CC: cpx - doing ok.   History of Present Illness:   80   year-old patient who is seen today for a wellness exam.  Medical problems include a history of chronic venous insufficiency and osteoarthritis, involving the right knee. She is followed by orthopedics occasionally. She has hypertension. History colonic polyps and exogenous obesity.  She is evaluated by GYN annually and does obtain annual mammogram; last colonoscopy 2010    Here for Medicare AWV:  1. Risk factors based on Past M, S, F history: Cardiovascular - risk factors include a history of hypertension.  2. Physical Activities: fairly active and cares for a number of children and grandchildren  3. Depression/mood: history of depression or mood disorder, although considerable situational stress  4. Hearing: no hearing impairment;   5. ADL's: independent in all aspects of daily living  6. Fall Risk-Moderate due to obesity and right knee pain 7. Home Safety: no problems identified  8. Height, weight, &visual acuity:height and weight are stable. No change in visual acuity  9. Counseling: heart healthy diet modest weight loss encouraged  10. Labs ordered based on risk factors: laboratory profile, including lipid panel and TSH will be reviewed  11. Referral Coordination- GYN referral  Last year; to return prn.  Orthopedic follow-up as necessary  12. Care Plan- heart healthy diet, weight loss encouraged  13. Cognitive Assessment- alert and oriented, with normal affect. History of memory dysfunction  and also executive functioning without difficulty  14.  Preventive services will include annual clinical examinations with screening lab.  The patient will consider follow-up and final colonoscopy  15.  Provider list includes primary care orthopedics, ophthalmology and radiology  Patient was provided with a written and personalized care plan  15.  Provider list includes primary care orthopedics.  OB/GYN and GI  as well as ophthalmology Preventive Screening-Counseling & Management  Alcohol-Tobacco  Smoking Status: never   Allergies:  1) ! Sulf-10  2) ! Bactrim Ds (Sulfamethoxazole-Trimethoprim)   Past History:   Colonic polyps, hx of  Hypertension  Osteoarthritis  history of lower extremity DVT January 2009  Fibroids  impaired glucose tolerance  chronic venous insufficiency   Past Surgical History:   Cholecystectomy 1998  Hysterectomy  cataract surgery os  colonoscopy in January 2010  bilateral salpingo-oophorectomy in September 2010   Family History:   Father died age 57, history of Parkinson's disease, senile dementia  mother has senile dementia presently 78 one brother in good health  one sister died of cardiac complications at age 43, history of EtOH   Social History:   cares for a disabled son  4 children, and 3 adopted children. 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 Negative for fever, appetite change and unexpected weight change.  HENT: Negative for congestion, dental problem, ear pain,  hearing loss, mouth sores, nosebleeds, sinus pressure, sore throat, tinnitus, trouble swallowing and voice change.   Eyes: Negative for photophobia, pain, redness and visual disturbance.  Respiratory: Positive for shortness of breath. Negative for cough and chest tightness.    Cardiovascular: Negative for chest pain, palpitations and leg swelling.  Gastrointestinal: Negative for nausea, vomiting, abdominal pain, diarrhea, constipation, blood in stool, abdominal distention and rectal pain.  Genitourinary: Negative for dysuria, urgency, frequency, hematuria, flank pain, vaginal bleeding, vaginal discharge, difficulty urinating, genital sores, vaginal pain, menstrual problem and pelvic pain.  Musculoskeletal: Positive for gait problem. Negative for back pain, arthralgias (significant right knee pain. Wears a brace) and neck stiffness.  Skin: Negative for rash.  Neurological: Negative for dizziness, syncope, speech difficulty, weakness, light-headedness, numbness and headaches.  Hematological: Negative for adenopathy. Does not bruise/bleed easily.  Psychiatric/Behavioral: Negative for suicidal ideas, behavioral problems, self-injury, dysphoric mood and agitation. The patient is not nervous/anxious.        Objective:   Physical Exam  Constitutional: She is oriented to person, place, and time. She appears well-developed and well-nourished.  Blood pressure 124/70 Weight 216  HENT:  Head: Normocephalic and atraumatic.  Right Ear: External ear normal.  Left Ear: External ear normal.  Mouth/Throat: Oropharynx is clear and moist.  Eyes: Conjunctivae and EOM are normal.  Neck: Normal range of motion. Neck supple. No JVD present. No thyromegaly present.  Cardiovascular: Normal rate, regular rhythm, normal heart sounds and intact distal pulses.   No murmur heard. Pulmonary/Chest: Effort normal and breath sounds normal. She has no wheezes. She has no rales.  Abdominal: Soft. Bowel sounds are normal. She exhibits no distension and no mass. There is no tenderness. There is no rebound and no guarding.  Genitourinary: Vagina normal.  Musculoskeletal: Normal range of motion. She exhibits no edema or tenderness.  Brace right knee  Neurological: She is alert and oriented to  person, place, and time. She has normal reflexes. No cranial nerve deficit. She exhibits normal muscle tone. Coordination normal.  Skin: Skin is warm and dry. No rash noted.  Psychiatric: She has a normal mood and affect. Her behavior is normal.          Assessment & Plan:   Preventive health exam Hypertension-Well controlled Hypokalemia.  Patient now on potassium supplementation Osteoarthritis.  Right knee pain is stable Exogenous obesity.  Weight loss encouraged Colonic polyps. Burnis Medin check stool for occult blood and check CBC   Low salt diet more regular exercise weight loss all encouraged. We'll reassess in 6 months. Laboratory studies reviewed   Nyoka Cowden, MD

## 2015-07-27 NOTE — Progress Notes (Signed)
Pre visit review using our clinic review tool, if applicable. No additional management support is needed unless otherwise documented below in the visit note. 

## 2015-07-29 ENCOUNTER — Encounter: Payer: Self-pay | Admitting: Internal Medicine

## 2015-08-11 ENCOUNTER — Other Ambulatory Visit (INDEPENDENT_AMBULATORY_CARE_PROVIDER_SITE_OTHER): Payer: PPO

## 2015-08-11 DIAGNOSIS — K921 Melena: Secondary | ICD-10-CM | POA: Diagnosis not present

## 2015-08-11 LAB — POC HEMOCCULT BLD/STL (OFFICE/1-CARD/DIAGNOSTIC)
FECAL OCCULT BLD: NEGATIVE
Fecal Occult Blood, POC: NEGATIVE

## 2015-08-19 ENCOUNTER — Other Ambulatory Visit: Payer: Self-pay | Admitting: Family Medicine

## 2015-11-15 DIAGNOSIS — H401131 Primary open-angle glaucoma, bilateral, mild stage: Secondary | ICD-10-CM | POA: Diagnosis not present

## 2015-11-21 ENCOUNTER — Other Ambulatory Visit: Payer: Self-pay | Admitting: Internal Medicine

## 2015-12-14 ENCOUNTER — Ambulatory Visit (INDEPENDENT_AMBULATORY_CARE_PROVIDER_SITE_OTHER): Payer: PPO | Admitting: Internal Medicine

## 2015-12-14 ENCOUNTER — Ambulatory Visit: Payer: PPO

## 2015-12-14 DIAGNOSIS — Z23 Encounter for immunization: Secondary | ICD-10-CM

## 2015-12-30 ENCOUNTER — Other Ambulatory Visit: Payer: Self-pay | Admitting: Internal Medicine

## 2016-01-27 ENCOUNTER — Encounter: Payer: Self-pay | Admitting: Internal Medicine

## 2016-01-27 ENCOUNTER — Ambulatory Visit (INDEPENDENT_AMBULATORY_CARE_PROVIDER_SITE_OTHER): Payer: PPO | Admitting: Internal Medicine

## 2016-01-27 VITALS — BP 130/64 | HR 78 | Temp 97.7°F | Resp 20 | Ht 65.0 in | Wt 210.4 lb

## 2016-01-27 DIAGNOSIS — M150 Primary generalized (osteo)arthritis: Secondary | ICD-10-CM | POA: Diagnosis not present

## 2016-01-27 DIAGNOSIS — I10 Essential (primary) hypertension: Secondary | ICD-10-CM | POA: Diagnosis not present

## 2016-01-27 DIAGNOSIS — M159 Polyosteoarthritis, unspecified: Secondary | ICD-10-CM

## 2016-01-27 MED ORDER — AMLODIPINE BESYLATE 2.5 MG PO TABS: ORAL_TABLET | ORAL | 5 refills | Status: DC

## 2016-01-27 NOTE — Progress Notes (Signed)
Subjective:    Patient ID: Kristina Allison, female    DOB: 1935/09/05, 80 y.o.   MRN: [id number]  HPI 80 year old patient who is seen today for her six-month follow-up.  She has a history of essential hypertension which has been stable.  She was seen 6 months ago for her annual exam and noted to have hypokalemia.  She is now on potassium supplementation. Patient does have some situational stressors at home, but in general doing well.  Past Medical History:  Diagnosis Date  . COLONIC POLYPS, HX OF 11/28/2006  . DVT 03/10/2007  . Edema 02/21/2007  . ESOPHAGITIS NOS 09/27/2006  . GOUT 06/04/2007  . HIP PAIN, LEFT 01/30/2007  . HYPERTENSION 11/28/2006  . OBESITY NOS 09/27/2006  . OSTEOARTHRITIS 11/28/2006  . OSTEOARTHROSIS, GENERALIZED, UNSPC SITE 09/27/2006  . PHLEBITIS&THROMBOPHLEBITIS LOWER EXTREM UNSPEC 03/07/2007  . STATE, SYMPTOMATIC MENOPAUSE/FEM CLIMACTERIC 09/27/2006  . VENOUS INSUFFICIENCY, CHRONIC, RIGHT LEG 09/06/2009     Social History   Social History  . Marital status: Married    Spouse name: N/A  . Number of children: N/A  . Years of education: N/A   Occupational History  . Not on file.   Social History Main Topics  . Smoking status: Never Smoker  . Smokeless tobacco: Never Used  . Alcohol use No  . Drug use: No  . Sexual activity: Not on file   Other Topics Concern  . Not on file   Social History Narrative  . No narrative on file    No past surgical history on file.  No family history on file.  Allergies  Allergen Reactions  . Sulfacetamide Sodium Itching and Rash  . Sulfamethoxazole-Trimethoprim Itching and Rash    Current Outpatient Prescriptions on File Prior to Visit  Medication Sig Dispense Refill  . allopurinol (ZYLOPRIM) 100 MG tablet TAKE 1 TABLET(100 MG) BY MOUTH DAILY 90 tablet 3  . clobetasol cream (TEMOVATE) 0.05 % Apply topically 2 (two) times daily. 30 g 3  . latanoprost (XALATAN) 0.005 % ophthalmic solution Place 1 drop into both eyes at  bedtime.    Marland Kitchen losartan-hydrochlorothiazide (HYZAAR) 100-12.5 MG tablet TAKE 1 TABLET BY MOUTH DAILY 90 tablet 1  . potassium chloride SA (K-DUR,KLOR-CON) 20 MEQ tablet TAKE 1 TABLET(20 MEQ) BY MOUTH DAILY 90 tablet 3   No current facility-administered medications on file prior to visit.     BP 130/64 (BP Location: Left Arm, Patient Position: Sitting, Cuff Size: Large)   Pulse 78   Temp 97.7 F (36.5 C) (Oral)   Resp 20   Ht 5\' 5"  (1.651 m)   Wt 210 lb 6.1 oz (95.4 kg)   SpO2 97%   BMI 35.01 kg/m      Review of Systems  Constitutional: Negative.   HENT: Negative for congestion, dental problem, hearing loss, rhinorrhea, sinus pressure, sore throat and tinnitus.   Eyes: Negative for pain, discharge and visual disturbance.  Respiratory: Negative for cough and shortness of breath.   Cardiovascular: Positive for leg swelling. Negative for chest pain and palpitations.  Gastrointestinal: Negative for abdominal distention, abdominal pain, blood in stool, constipation, diarrhea, nausea and vomiting.  Genitourinary: Negative for difficulty urinating, dysuria, flank pain, frequency, hematuria, pelvic pain, urgency, vaginal bleeding, vaginal discharge and vaginal pain.  Musculoskeletal: Positive for arthralgias and back pain. Negative for gait problem and joint swelling.  Skin: Negative for rash.  Neurological: Negative for dizziness, syncope, speech difficulty, weakness, numbness and headaches.  Hematological: Negative for adenopathy.  Psychiatric/Behavioral: Negative  for agitation, behavioral problems and dysphoric mood. The patient is not nervous/anxious.        Objective:   Physical Exam  Constitutional: She is oriented to person, place, and time. She appears well-developed and well-nourished.  Blood pressure 130/70  HENT:  Head: Normocephalic.  Right Ear: External ear normal.  Left Ear: External ear normal.  Mouth/Throat: Oropharynx is clear and moist.  Eyes: Conjunctivae and  EOM are normal. Pupils are equal, round, and reactive to light.  Neck: Normal range of motion. Neck supple. No thyromegaly present.  Cardiovascular: Normal rate, regular rhythm, normal heart sounds and intact distal pulses.   Pulmonary/Chest: Effort normal and breath sounds normal.  Abdominal: Soft. Bowel sounds are normal. She exhibits no mass. There is no tenderness.  Musculoskeletal: Normal range of motion.  Right leg in TED hose Slight edema or left leg  Lymphadenopathy:    She has no cervical adenopathy.  Neurological: She is alert and oriented to person, place, and time.  Skin: Skin is warm and dry. No rash noted.  Psychiatric: She has a normal mood and affect. Her behavior is normal.          Assessment & Plan:   Essential hypertension, well-controlled.  No change in therapy History of diuretic associated hypokalemia.  Continue potassium supplementation Chronic venous insufficiency  No change in medical regimen CPX 6 months Medications updated  Nyoka Cowden

## 2016-01-27 NOTE — Patient Instructions (Signed)
Limit your sodium (Salt) intake    It is important that you exercise regularly, at least 20 minutes 3 to 4 times per week.  If you develop chest pain or shortness of breath seek  medical attention.  You need to lose weight.  Consider a lower calorie diet and regular exercise.  Take a calcium supplement, plus [phone] units of vitamin D  Please check your blood pressure on a regular basis.  If it is consistently greater than 150/90, please make an office appointment.

## 2016-04-20 ENCOUNTER — Other Ambulatory Visit: Payer: Self-pay | Admitting: Family Medicine

## 2016-05-15 DIAGNOSIS — H401131 Primary open-angle glaucoma, bilateral, mild stage: Secondary | ICD-10-CM | POA: Diagnosis not present

## 2016-05-18 ENCOUNTER — Other Ambulatory Visit: Payer: Self-pay | Admitting: Internal Medicine

## 2016-07-21 ENCOUNTER — Other Ambulatory Visit: Payer: Self-pay | Admitting: Family Medicine

## 2016-07-25 DIAGNOSIS — Z1231 Encounter for screening mammogram for malignant neoplasm of breast: Secondary | ICD-10-CM | POA: Diagnosis not present

## 2016-07-25 DIAGNOSIS — Z803 Family history of malignant neoplasm of breast: Secondary | ICD-10-CM | POA: Diagnosis not present

## 2016-07-25 LAB — HM MAMMOGRAPHY

## 2016-07-26 ENCOUNTER — Other Ambulatory Visit: Payer: Self-pay | Admitting: *Deleted

## 2016-07-26 MED ORDER — ALLOPURINOL 100 MG PO TABS: ORAL_TABLET | ORAL | 0 refills | Status: DC

## 2016-07-27 ENCOUNTER — Encounter: Payer: Self-pay | Admitting: Internal Medicine

## 2016-07-27 ENCOUNTER — Ambulatory Visit (INDEPENDENT_AMBULATORY_CARE_PROVIDER_SITE_OTHER): Payer: PPO | Admitting: Internal Medicine

## 2016-07-27 VITALS — BP 132/64 | HR 76 | Temp 97.9°F | Ht 65.0 in | Wt 206.8 lb

## 2016-07-27 DIAGNOSIS — M150 Primary generalized (osteo)arthritis: Secondary | ICD-10-CM | POA: Diagnosis not present

## 2016-07-27 DIAGNOSIS — I872 Venous insufficiency (chronic) (peripheral): Secondary | ICD-10-CM

## 2016-07-27 DIAGNOSIS — M159 Polyosteoarthritis, unspecified: Secondary | ICD-10-CM

## 2016-07-27 DIAGNOSIS — Z0000 Encounter for general adult medical examination without abnormal findings: Secondary | ICD-10-CM

## 2016-07-27 DIAGNOSIS — Z86010 Personal history of colonic polyps: Secondary | ICD-10-CM | POA: Diagnosis not present

## 2016-07-27 DIAGNOSIS — I10 Essential (primary) hypertension: Secondary | ICD-10-CM

## 2016-07-27 LAB — CBC WITH DIFFERENTIAL/PLATELET
BASOS PCT: 0.9 % (ref 0.0–3.0)
Basophils Absolute: 0.1 10*3/uL (ref 0.0–0.1)
EOS ABS: 0.3 10*3/uL (ref 0.0–0.7)
EOS PCT: 4 % (ref 0.0–5.0)
HEMATOCRIT: 39.7 % (ref 36.0–46.0)
HEMOGLOBIN: 13.4 g/dL (ref 12.0–15.0)
LYMPHS PCT: 27.9 % (ref 12.0–46.0)
Lymphs Abs: 2.3 10*3/uL (ref 0.7–4.0)
MCHC: 33.8 g/dL (ref 30.0–36.0)
MCV: 89.3 fl (ref 78.0–100.0)
MONOS PCT: 7.1 % (ref 3.0–12.0)
Monocytes Absolute: 0.6 10*3/uL (ref 0.1–1.0)
NEUTROS ABS: 4.9 10*3/uL (ref 1.4–7.7)
Neutrophils Relative %: 60.1 % (ref 43.0–77.0)
Platelets: 309 10*3/uL (ref 150.0–400.0)
RBC: 4.45 Mil/uL (ref 3.87–5.11)
RDW: 13.5 % (ref 11.5–15.5)
WBC: 8.2 10*3/uL (ref 4.0–10.5)

## 2016-07-27 LAB — COMPREHENSIVE METABOLIC PANEL
ALBUMIN: 4.1 g/dL (ref 3.5–5.2)
ALT: 8 U/L (ref 0–35)
AST: 10 U/L (ref 0–37)
Alkaline Phosphatase: 71 U/L (ref 39–117)
BUN: 19 mg/dL (ref 6–23)
CALCIUM: 9.7 mg/dL (ref 8.4–10.5)
CHLORIDE: 102 meq/L (ref 96–112)
CO2: 30 meq/L (ref 19–32)
CREATININE: 0.58 mg/dL (ref 0.40–1.20)
GFR: 106.48 mL/min (ref >60.00)
Glucose, Bld: 110 mg/dL — ABNORMAL HIGH (ref 70–99)
POTASSIUM: 3.4 meq/L — AB (ref 3.5–5.1)
Sodium: 140 mEq/L (ref 135–145)
Total Bilirubin: 1 mg/dL (ref 0.2–1.2)
Total Protein: 6.7 g/dL (ref 6.0–8.3)

## 2016-07-27 LAB — TSH: TSH: 1.56 u[IU]/mL (ref 0.35–4.50)

## 2016-07-27 LAB — LIPID PANEL
CHOL/HDL RATIO: 3
CHOLESTEROL: 143 mg/dL (ref 0–200)
HDL: 46.7 mg/dL (ref >39.00)
LDL CALC: 77 mg/dL (ref 0–99)
NonHDL: 96.51
TRIGLYCERIDES: 96 mg/dL (ref 0.0–149.0)
VLDL: 19.2 mg/dL (ref 0.0–40.0)

## 2016-07-27 NOTE — Patient Instructions (Addendum)
WE NOW OFFER   [facility] FAST TRACK!!!  SAME DAY Appointments for ACUTE CARE  Such as: Sprains, Injuries, cuts, abrasions, rashes, muscle pain, joint pain, back pain Colds, flu, sore throats, headache, allergies, cough, fever  Ear pain, sinus and eye infections Abdominal pain, nausea, vomiting, diarrhea, upset stomach Animal/insect bites  3 Easy Ways to Schedule: Walk-In Scheduling Call in scheduling Mychart Sign-up: [email or web address]  Please return slides to check stool for hidden blood  Limit your sodium (Salt) intake  Please check your blood pressure on a regular basis.  If it is consistently greater than 150/90, please make an office appointment.  Return in 6 months for follow-up

## 2016-07-27 NOTE — Progress Notes (Signed)
Subjective:    Patient ID: Kristina Allison, female    DOB: [birth year], 81 y.o.   MRN: [id number]  HPI 81 year old patient who is seen today for an annual preventive examination as well as a subsequent Medicare wellness visit She is doing reasonably well.  Has had some situational stress and some right knee pain.  Did have a follow-up mammogram 2 days ago She has a history of colonic polyps and last colonoscopy was 2010.  Patient does not desire any further screening colonoscopies   Past Medical History:  Diagnosis Date  . COLONIC POLYPS, HX OF 11/28/2006  . DVT 03/10/2007  . Edema 02/21/2007  . ESOPHAGITIS NOS 09/27/2006  . GOUT 06/04/2007  . HIP PAIN, LEFT 01/30/2007  . HYPERTENSION 11/28/2006  . OBESITY NOS 09/27/2006  . OSTEOARTHRITIS 11/28/2006  . OSTEOARTHROSIS, GENERALIZED, UNSPC SITE 09/27/2006  . PHLEBITIS&THROMBOPHLEBITIS LOWER EXTREM UNSPEC 03/07/2007  . STATE, SYMPTOMATIC MENOPAUSE/FEM CLIMACTERIC 09/27/2006  . VENOUS INSUFFICIENCY, CHRONIC, RIGHT LEG 09/06/2009     Social History   Social History  . Marital status: Married    Spouse name: N/A  . Number of children: N/A  . Years of education: N/A   Occupational History  . Not on file.   Social History Main Topics  . Smoking status: Never Smoker  . Smokeless tobacco: Never Used  . Alcohol use No  . Drug use: No  . Sexual activity: Not on file   Other Topics Concern  . Not on file   Social History Narrative  . No narrative on file    No past surgical history on file.  No family history on file.  Allergies  Allergen Reactions  . Sulfacetamide Sodium Itching and Rash  . Sulfamethoxazole-Trimethoprim Itching and Rash    Current Outpatient Prescriptions on File Prior to Visit  Medication Sig Dispense Refill  . allopurinol (ZYLOPRIM) 100 MG tablet TAKE 1 TABLET(100 MG) BY MOUTH DAILY 90 tablet 0  . amLODipine (NORVASC) 2.5 MG tablet TAKE 1 TABLET(2.5 MG) BY MOUTH DAILY 30 tablet 5  . clobetasol cream (TEMOVATE)  0.05 % Apply topically 2 (two) times daily. 30 g 3  . latanoprost (XALATAN) 0.005 % ophthalmic solution Place 1 drop into both eyes at bedtime.    Marland Kitchen losartan-hydrochlorothiazide (HYZAAR) 100-12.5 MG tablet TAKE 1 TABLET BY MOUTH DAILY 90 tablet 0  . potassium chloride SA (K-DUR,KLOR-CON) 20 MEQ tablet TAKE 1 TABLET(20 MEQ) BY MOUTH DAILY 90 tablet 3   No current facility-administered medications on file prior to visit.     BP 132/64 (BP Location: Left Arm, Patient Position: Sitting, Cuff Size: Large)   Pulse 76   Temp 97.9 F (36.6 C) (Oral)   Ht 5\' 5"  (1.651 m)   Wt 206 lb 12.8 oz (93.8 kg)   SpO2 98%   BMI 34.41 kg/m   Medicare wellness visit  1. Risk factors based on Past M, S, F history: Cardiovascular - risk factors include a history of hypertension.  2. Physical Activities: fairly active and cares for a number of children and grandchildren; very little physical activity due to arthritis, especially right knee pain 3. Depression/mood: history of depression or mood disorder, although considerable situational stress.  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 Hearing: no hearing impairment;   5. ADL's: independent in all aspects of daily living  6. Fall Risk-Moderate due to obesity and right knee pain 7. Home Safety: no problems identified  8. Height, weight, &visual acuity:height and weight are stable. No change in visual  acuity  9. Counseling: heart healthy diet modest weight loss encouraged  10. Labs ordered based on risk factors: laboratory profile, including lipid panel and TSH will be reviewed  11. Referral Coordination-   Orthopedic follow-up as necessary  12. Care Plan- heart healthy diet, weight loss encouraged  13. Cognitive Assessment- alert and oriented, with normal affect. History of memory dysfunction and also executive functioning without difficulty  14.  Preventive services will include annual clinical examinations with screening lab.  The patient will continue to have  annual checks for fecal occult blood with CBC 15.  Provider list includes primary care orthopedics, ophthalmology and radiology  Patient was provided with a written and personalized care plan  15.  Provider list includes primary care orthopedics.  OB/GYN and GI  as well as ophthalmology   Preventive Screening-Counseling & Management  Alcohol-Tobacco  Smoking Status: never   Allergies:  1) ! Sulf-10  2) ! Bactrim Ds (Sulfamethoxazole-Trimethoprim)   Past History:   Colonic polyps, hx of  Hypertension  Osteoarthritis  history of lower extremity DVT January 2009  Fibroids  impaired glucose tolerance  chronic venous insufficiency   Past Surgical History:   Cholecystectomy 1998  Hysterectomy  cataract surgery os  colonoscopy in January 2010  bilateral salpingo-oophorectomy in September 2010   Family History:   Father died age 64, history of Parkinson's disease, senile dementia  mother has senile dementia presently 2 one brother in good health  one sister died of cardiac complications at age 64, history of EtOH   Social History:   cares for a disabled son  4 children, and 3 adopted children. 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   HENT: Negative for congestion, dental problem, hearing loss, rhinorrhea, sinus pressure, sore throat and tinnitus.   Eyes: Negative for pain, discharge and visual disturbance.  Respiratory: Negative for cough and shortness of breath.   Cardiovascular: Positive for leg swelling. Negative for chest pain and palpitations.  Gastrointestinal: Negative for abdominal distention, abdominal pain, blood in stool, constipation, diarrhea, nausea and vomiting.  Genitourinary: Negative for difficulty urinating, dysuria, flank pain, frequency, hematuria, pelvic pain,  urgency, vaginal bleeding, vaginal discharge and vaginal pain.  Musculoskeletal: Positive for arthralgias and joint swelling. Negative for gait problem.  Skin: Negative for rash.  Neurological: Negative for dizziness, syncope, speech difficulty, weakness, numbness and headaches.  Hematological: Negative for adenopathy.  Psychiatric/Behavioral: Negative for agitation, behavioral problems and dysphoric mood. The patient is nervous/anxious.        Objective:   Physical Exam  Constitutional: She is oriented to person, place, and time. She appears well-developed and well-nourished.  Blood pressure well controlled Weight 206  HENT:  Head: Normocephalic.  Right Ear: External ear normal.  Left Ear: External ear normal.  Mouth/Throat: Oropharynx is clear and moist.  Eyes: Conjunctivae and EOM are normal. Pupils are equal, round, and reactive to light.  Neck: Normal range of motion. Neck supple. No thyromegaly present.  Cardiovascular: Normal rate, regular rhythm, normal heart sounds and intact distal pulses.   Pulmonary/Chest: Effort normal and breath sounds normal.  Abdominal: Soft. Bowel sounds are normal. She exhibits no mass. There is no tenderness.  Musculoskeletal: Normal range of motion.  Right knee brace TED hose  Lymphadenopathy:    She has no cervical adenopathy.  Neurological: She is alert and oriented to person, place, and time.  Skin: Skin is warm and dry. No rash noted.  Psychiatric: She has a normal mood and affect. Her behavior is normal.          Assessment & Plan:   Preventive health examination.  Will check stool for occult blood times 4.  Review CBC and general lab Osteoarthritis Essential hypertension, well-controlled Medicare wellness visit  Follow-up 6 months  [id number],PETER Pilar Plate

## 2016-07-30 ENCOUNTER — Encounter: Payer: Self-pay | Admitting: Family Medicine

## 2016-08-15 ENCOUNTER — Other Ambulatory Visit (INDEPENDENT_AMBULATORY_CARE_PROVIDER_SITE_OTHER): Payer: PPO

## 2016-08-15 DIAGNOSIS — Z86010 Personal history of colonic polyps: Secondary | ICD-10-CM | POA: Diagnosis not present

## 2016-08-23 ENCOUNTER — Ambulatory Visit (INDEPENDENT_AMBULATORY_CARE_PROVIDER_SITE_OTHER): Payer: PPO | Admitting: Family Medicine

## 2016-08-23 ENCOUNTER — Encounter: Payer: Self-pay | Admitting: Family Medicine

## 2016-08-23 VITALS — BP 136/62 | HR 90 | Temp 98.4°F | Ht 65.0 in | Wt 205.0 lb

## 2016-08-23 DIAGNOSIS — R3915 Urgency of urination: Secondary | ICD-10-CM | POA: Diagnosis not present

## 2016-08-23 DIAGNOSIS — N3090 Cystitis, unspecified without hematuria: Secondary | ICD-10-CM | POA: Diagnosis not present

## 2016-08-23 LAB — POC URINALSYSI DIPSTICK (AUTOMATED)
GLUCOSE UA: NEGATIVE
KETONES UA: NEGATIVE
NITRITE UA: POSITIVE
Protein, UA: NEGATIVE
RBC UA: NEGATIVE
SPEC GRAV UA: 1.01 (ref 1.010–1.025)
Urobilinogen, UA: 4 E.U./dL — AB
pH, UA: 6 (ref 5.0–8.0)

## 2016-08-23 MED ORDER — AMOXICILLIN-POT CLAVULANATE 875-125 MG PO TABS: 1.0000 | ORAL_TABLET | Freq: Two times a day (BID) | ORAL | 0 refills | Status: DC

## 2016-08-23 NOTE — Addendum Note (Signed)
Addended by: Wyvonne Lenz on: 08/23/2016 02:16 PM   Modules accepted: Orders

## 2016-08-23 NOTE — Patient Instructions (Signed)
Please take medication as directed with food and follow up if symptoms do not improve with treatment, worsen, or you develop a fever >100.   Urinary Tract Infection, Adult A urinary tract infection (UTI) is an infection of any part of the urinary tract. The urinary tract includes the:  Kidneys.  Ureters.  Bladder.  Urethra.  These organs make, store, and get rid of pee (urine) in the body. Follow these instructions at home:  Take over-the-counter and prescription medicines only as told by your doctor.  If you were prescribed an antibiotic medicine, take it as told by your doctor. Do not stop taking the antibiotic even if you start to feel better.  Avoid the following drinks: ? Alcohol. ? Caffeine. ? Tea. ? Carbonated drinks.  Drink enough fluid to keep your pee clear or pale yellow.  Keep all follow-up visits as told by your doctor. This is important.  Make sure to: ? Empty your bladder often and completely. Do not to hold pee for long periods of time. ? Empty your bladder before and after sex. ? Wipe from front to back after a bowel movement if you are female. Use each tissue one time when you wipe. Contact a doctor if:  You have back pain.  You have a fever.  You feel sick to your stomach (nauseous).  You throw up (vomit).  Your symptoms do not get better after 3 days.  Your symptoms go away and then come back. Get help right away if:  You have very bad back pain.  You have very bad lower belly (abdominal) pain.  You are throwing up and cannot keep down any medicines or water. This information is not intended to replace advice given to you by your health care provider. Make sure you discuss any questions you have with your health care provider. Document Released: 08/01/2007 Document Revised: 07/21/2015 Document Reviewed: 01/03/2015 Elsevier Interactive Patient Education  [employer].

## 2016-08-23 NOTE — Progress Notes (Signed)
Patient ID: Kristina Allison, female   DOB: 1935-06-30, 81 y.o.   MRN: [id number]    PCP: Marletta Lor, MD  Subjective:  Kristina Allison is a 81 y.o. year old very pleasant female patient who presents with Urinary Tract symptoms: symptoms including dysuria, polyuria, nocturia, and urgency. -started: one week, symptoms are worsening -previous treatments: AZO has provided moderate benefit. -No aggravating or alleviating factors noted.  ROS-denies fever, chills, sweats, N/V, flank pain, or blood in urine  Pertinent Past Medical History- HTN, Gout  Medications- reviewed  Current Outpatient Prescriptions  Medication Sig Dispense Refill  . allopurinol (ZYLOPRIM) 100 MG tablet TAKE 1 TABLET(100 MG) BY MOUTH DAILY 90 tablet 0  . amLODipine (NORVASC) 2.5 MG tablet TAKE 1 TABLET(2.5 MG) BY MOUTH DAILY 30 tablet 5  . clobetasol cream (TEMOVATE) 0.05 % Apply topically 2 (two) times daily. 30 g 3  . latanoprost (XALATAN) 0.005 % ophthalmic solution Place 1 drop into both eyes at bedtime.    Marland Kitchen losartan-hydrochlorothiazide (HYZAAR) 100-12.5 MG tablet TAKE 1 TABLET BY MOUTH DAILY 90 tablet 0  . potassium chloride SA (K-DUR,KLOR-CON) 20 MEQ tablet TAKE 1 TABLET(20 MEQ) BY MOUTH DAILY 90 tablet 3   No current facility-administered medications for this visit.     Objective: BP 136/62 (BP Location: Left Arm, Patient Position: Sitting, Cuff Size: Normal)   Pulse 90   Temp 98.4 F (36.9 C) (Oral)   Ht 5\' 5"  (1.651 m)   Wt 205 lb (93 kg)   SpO2 98%   BMI 34.11 kg/m  Gen: NAD, resting comfortably HEENT: Oropharynx clear and moist CV: RRR no murmurs rubs or gallops Lungs: CTAB no crackles, wheeze, rhonchi Abdomen: soft/nontender/nondistended/normal bowel sounds. No rebound or guarding.  No CVA tenderness.   Mild Suprapubic tenderness Ext: no edema Skin: warm, dry, no rash Neuro: grossly normal, moves all extremities  Assessment/Plan: 1. Cystitis UA indicates 2+leukocytes, 1+ nitrites,  and urobilinogen. Symptoms are consistent with cystitis; AZO may be influencing UA; will send for culture; follow up if symptoms do not improve, worsen, or she develops a fever >100. - amoxicillin-clavulanate (AUGMENTIN) 875-125 MG tablet; Take 1 tablet by mouth 2 (two) times daily.  Dispense: 20 tablet; Refill: 0  2. Urgency of urination Cystitis; will send for culture - POCT Urinalysis Dipstick (Automated)  Advised patient to complete antibiotic and she should follow up if her symptoms do not improve in 2 to 3 days, worsen, she develops a fever >101, or back pain. Also advised her to force fluids and she can use OTC Azo for symptom relief temporarily if needed. Patient voiced understanding and agreed with plan.  Finally, we reviewed reasons to return to care including if symptoms worsen or persist or new concerns arise- once again particularly fever, N/V, or flank pain.    Laurita Quint, FNP

## 2016-08-24 LAB — POC HEMOCCULT BLD/STL (HOME/3-CARD/SCREEN)
Card #2 Fecal Occult Blod, POC: NEGATIVE
Card #3 Fecal Occult Blood, POC: NEGATIVE
Fecal Occult Blood, POC: NEGATIVE

## 2016-08-26 LAB — URINE CULTURE

## 2016-08-28 ENCOUNTER — Other Ambulatory Visit: Payer: Self-pay | Admitting: Internal Medicine

## 2016-11-20 ENCOUNTER — Other Ambulatory Visit: Payer: Self-pay | Admitting: Internal Medicine

## 2016-11-28 DIAGNOSIS — H401131 Primary open-angle glaucoma, bilateral, mild stage: Secondary | ICD-10-CM | POA: Diagnosis not present

## 2016-11-28 DIAGNOSIS — H43813 Vitreous degeneration, bilateral: Secondary | ICD-10-CM | POA: Diagnosis not present

## 2016-12-04 ENCOUNTER — Ambulatory Visit (INDEPENDENT_AMBULATORY_CARE_PROVIDER_SITE_OTHER): Payer: PPO | Admitting: *Deleted

## 2016-12-04 DIAGNOSIS — Z23 Encounter for immunization: Secondary | ICD-10-CM | POA: Diagnosis not present

## 2017-01-28 ENCOUNTER — Encounter: Payer: Self-pay | Admitting: Internal Medicine

## 2017-01-28 ENCOUNTER — Ambulatory Visit: Payer: PPO | Admitting: Internal Medicine

## 2017-01-28 VITALS — BP 136/68 | HR 82 | Temp 98.4°F | Ht 65.0 in | Wt 204.6 lb

## 2017-01-28 DIAGNOSIS — I10 Essential (primary) hypertension: Secondary | ICD-10-CM | POA: Diagnosis not present

## 2017-01-28 DIAGNOSIS — M150 Primary generalized (osteo)arthritis: Secondary | ICD-10-CM

## 2017-01-28 DIAGNOSIS — M159 Polyosteoarthritis, unspecified: Secondary | ICD-10-CM

## 2017-01-28 MED ORDER — LATANOPROST 0.005 % OP SOLN: 1.0000 [drp] | Freq: Every day | OPHTHALMIC | 3 refills | Status: DC

## 2017-01-28 MED ORDER — CLOBETASOL PROPIONATE 0.05 % EX CREA: TOPICAL_CREAM | Freq: Two times a day (BID) | CUTANEOUS | 3 refills | Status: DC

## 2017-01-28 MED ORDER — ALLOPURINOL 100 MG PO TABS: ORAL_TABLET | ORAL | 3 refills | Status: DC

## 2017-01-28 MED ORDER — POTASSIUM CHLORIDE CRYS ER 20 MEQ PO TBCR: EXTENDED_RELEASE_TABLET | ORAL | 3 refills | Status: DC

## 2017-01-28 MED ORDER — LOSARTAN POTASSIUM-HCTZ 100-12.5 MG PO TABS: 1.0000 | ORAL_TABLET | Freq: Every day | ORAL | 3 refills | Status: DC

## 2017-01-28 NOTE — Patient Instructions (Signed)
Limit your sodium (Salt) intake  She appears well, in no apparent distress.  Alert and oriented times three, pleasant and cooperative. Vital signs are as documented in vital signs section.  Please check your blood pressure on a regular basis.  If it is consistently greater than 150/90, please make an office appointment.  Return in 6 months for follow-up

## 2017-01-28 NOTE — Progress Notes (Signed)
Subjective:    Patient ID: Kristina Allison, female    DOB: 05/24/1935, 81 y.o.   MRN: [id number]  HPI 81 year old patient who is seen today for her biannual follow-up.  She has essential hypertension which has been well controlled on present regimen.  Her chief complaint is arthritis. She wears a chronic her right knee brace and also having some hip pain. Otherwise doing well without concerns or complaints.  She does have a history of gout which has been stable.  She has obesity and a history of colonic polyps. She has remote history of DVT as well as venous insufficiency.  She has minimal lower extremity edema late in the day only  Past Medical History:  Diagnosis Date  . COLONIC POLYPS, HX OF 11/28/2006  . DVT 03/10/2007  . Edema 02/21/2007  . ESOPHAGITIS NOS 09/27/2006  . GOUT 06/04/2007  . HIP PAIN, LEFT 01/30/2007  . HYPERTENSION 11/28/2006  . OBESITY NOS 09/27/2006  . OSTEOARTHRITIS 11/28/2006  . OSTEOARTHROSIS, GENERALIZED, UNSPC SITE 09/27/2006  . PHLEBITIS&THROMBOPHLEBITIS LOWER EXTREM UNSPEC 03/07/2007  . STATE, SYMPTOMATIC MENOPAUSE/FEM CLIMACTERIC 09/27/2006  . VENOUS INSUFFICIENCY, CHRONIC, RIGHT LEG 09/06/2009     Social History   Socioeconomic History  . Marital status: Married    Spouse name: Not on file  . Number of children: Not on file  . Years of education: Not on file  . Highest education level: Not on file  Social Needs  . Financial resource strain: Not on file  . Food insecurity - worry: Not on file  . Food insecurity - inability: Not on file  . Transportation needs - medical: Not on file  . Transportation needs - non-medical: Not on file  Occupational History  . Not on file  Tobacco Use  . Smoking status: Never Smoker  . Smokeless tobacco: Never Used  Substance and Sexual Activity  . Alcohol use: No  . Drug use: No  . Sexual activity: Not on file  Other Topics Concern  . Not on file  Social History Narrative  . Not on file    History reviewed. No  pertinent surgical history.  History reviewed. No pertinent family history.  Allergies  Allergen Reactions  . Sulfacetamide Sodium Itching and Rash  . Sulfamethoxazole-Trimethoprim Itching and Rash    Current Outpatient Medications on File Prior to Visit  Medication Sig Dispense Refill  . allopurinol (ZYLOPRIM) 100 MG tablet TAKE 1 TABLET(100 MG) BY MOUTH DAILY 90 tablet 0  . clobetasol cream (TEMOVATE) 0.05 % Apply topically 2 (two) times daily. 30 g 3  . latanoprost (XALATAN) 0.005 % ophthalmic solution Place 1 drop into both eyes at bedtime.    Marland Kitchen losartan-hydrochlorothiazide (HYZAAR) 100-12.5 MG tablet TAKE 1 TABLET BY MOUTH DAILY 90 tablet 2  . potassium chloride SA (K-DUR,KLOR-CON) 20 MEQ tablet TAKE 1 TABLET(20 MEQ) BY MOUTH DAILY 90 tablet 3   No current facility-administered medications on file prior to visit.     BP 136/68 (BP Location: Left Arm, Patient Position: Sitting, Cuff Size: Large)   Pulse 82   Temp 98.4 F (36.9 C) (Oral)   Ht 5\' 5"  (1.651 m)   Wt 204 lb 9.6 oz (92.8 kg)   SpO2 97%   BMI 34.05 kg/m      Review of Systems  Constitutional: Negative.   HENT: Negative for congestion, dental problem, hearing loss, rhinorrhea, sinus pressure, sore throat and tinnitus.   Eyes: Negative for pain, discharge and visual disturbance.  Respiratory: Negative for cough and  shortness of breath.   Cardiovascular: Positive for leg swelling. Negative for chest pain and palpitations.  Gastrointestinal: Negative for abdominal distention, abdominal pain, blood in stool, constipation, diarrhea, nausea and vomiting.  Genitourinary: Negative for difficulty urinating, dysuria, flank pain, frequency, hematuria, pelvic pain, urgency, vaginal bleeding, vaginal discharge and vaginal pain.  Musculoskeletal: Positive for arthralgias and back pain. Negative for gait problem and joint swelling.  Skin: Negative for rash.  Neurological: Negative for dizziness, syncope, speech difficulty,  weakness, numbness and headaches.  Hematological: Negative for adenopathy.  Psychiatric/Behavioral: Negative for agitation, behavioral problems and dysphoric mood. The patient is not nervous/anxious.        Objective:   Physical Exam  Constitutional: She is oriented to person, place, and time. She appears well-developed and well-nourished.  HENT:  Head: Normocephalic.  Right Ear: External ear normal.  Left Ear: External ear normal.  Mouth/Throat: Oropharynx is clear and moist.  Eyes: Conjunctivae and EOM are normal. Pupils are equal, round, and reactive to light.  Neck: Normal range of motion. Neck supple. No thyromegaly present.  Cardiovascular: Normal rate, regular rhythm, normal heart sounds and intact distal pulses.  Pulmonary/Chest: Effort normal and breath sounds normal.  Abdominal: Soft. Bowel sounds are normal. She exhibits no mass. There is no tenderness.  Musculoskeletal: Normal range of motion. She exhibits no edema.  Brace right knee  Lymphadenopathy:    She has no cervical adenopathy.  Neurological: She is alert and oriented to person, place, and time.  Skin: Skin is warm and dry. No rash noted.  Psychiatric: She has a normal mood and affect. Her behavior is normal.          Assessment & Plan:   Essential hypertension well-controlled Osteoarthritis Chronic venous insufficiency stable History of glaucoma follow-up with ophthalmology History of gout stable   No change in medical regimen All medications refilled Annual exam 6 months  Kristina Allison

## 2017-02-01 ENCOUNTER — Other Ambulatory Visit: Payer: Self-pay | Admitting: Family Medicine

## 2017-02-25 ENCOUNTER — Ambulatory Visit: Payer: PPO | Admitting: Family Medicine

## 2017-02-25 ENCOUNTER — Encounter: Payer: Self-pay | Admitting: Family Medicine

## 2017-02-25 VITALS — BP 138/66 | HR 78 | Temp 98.2°F | Wt 202.6 lb

## 2017-02-25 DIAGNOSIS — N390 Urinary tract infection, site not specified: Secondary | ICD-10-CM | POA: Diagnosis not present

## 2017-02-25 DIAGNOSIS — R300 Dysuria: Secondary | ICD-10-CM | POA: Diagnosis not present

## 2017-02-25 LAB — POCT URINALYSIS DIPSTICK
Bilirubin, UA: NEGATIVE
GLUCOSE UA: NEGATIVE
KETONES UA: NEGATIVE
Nitrite, UA: NEGATIVE
SPEC GRAV UA: 1.02 (ref 1.010–1.025)
Urobilinogen, UA: 0.2 E.U./dL
pH, UA: 6 (ref 5.0–8.0)

## 2017-02-25 MED ORDER — CIPROFLOXACIN HCL 500 MG PO TABS: 500.0000 mg | ORAL_TABLET | Freq: Two times a day (BID) | ORAL | 0 refills | Status: DC

## 2017-02-25 NOTE — Progress Notes (Signed)
   Subjective:    Patient ID: Kristina Allison, female    DOB: 05-05-1935, 81 y.o.   MRN: [id number]  HPI Here for one week of urinary urgency and burning. No fever. Drinking lots of water and using AZO.    Review of Systems  Constitutional: Negative.   Respiratory: Negative.   Cardiovascular: Negative.   Gastrointestinal: Negative.   Genitourinary: Positive for dysuria, frequency and urgency. Negative for flank pain and hematuria.       Objective:   Physical Exam  Constitutional: She appears well-developed and well-nourished.  Cardiovascular: Normal rate, regular rhythm, normal heart sounds and intact distal pulses.  Pulmonary/Chest: Effort normal and breath sounds normal. No respiratory distress. She has no wheezes. She has no rales.  Abdominal: Soft. Bowel sounds are normal. She exhibits no distension and no mass. There is no tenderness. There is no rebound and no guarding.          Assessment & Plan:  UTI, treat with Cipro. Culture the sample.  Alysia Penna, MD

## 2017-02-28 LAB — URINE CULTURE
MICRO NUMBER:: 81463646
SPECIMEN QUALITY:: ADEQUATE

## 2017-03-01 ENCOUNTER — Telehealth: Payer: Self-pay | Admitting: Internal Medicine

## 2017-03-01 NOTE — Telephone Encounter (Signed)
Copied from [place] [id number]. Topic: Quick Communication - See Telephone Encounter >> Mar 01, 2017  9:40 AM Conception Chancy, NT wrote: CRM for notification. See Telephone encounter for:  03/01/17.  Patient states she was prescribed ciprofloxacin 500mg  for a UTI and it made her sick and feel like she was in a "cloud". She could not think straight so she stopped taking the medication and would like to know if it is something else the Doctor can call her in. Please advise

## 2017-03-01 NOTE — Telephone Encounter (Signed)
Sent to PCP ?

## 2017-03-01 NOTE — Telephone Encounter (Signed)
Pt was seen by Dr. Sarajane Jews on 02/25/2017, please advise?

## 2017-03-04 MED ORDER — NITROFURANTOIN MONOHYD MACRO 100 MG PO CAPS: 100.0000 mg | ORAL_CAPSULE | Freq: Two times a day (BID) | ORAL | 0 refills | Status: DC

## 2017-03-04 NOTE — Telephone Encounter (Signed)
Rx sent pt advised and voiced understanding.

## 2017-03-04 NOTE — Telephone Encounter (Signed)
Please call in a new prescription for generic Macrobid 100 #14 1 twice daily

## 2017-04-22 ENCOUNTER — Other Ambulatory Visit: Payer: Self-pay | Admitting: Internal Medicine

## 2017-05-21 ENCOUNTER — Other Ambulatory Visit: Payer: Self-pay | Admitting: Internal Medicine

## 2017-05-29 DIAGNOSIS — H401131 Primary open-angle glaucoma, bilateral, mild stage: Secondary | ICD-10-CM | POA: Diagnosis not present

## 2017-07-29 ENCOUNTER — Ambulatory Visit (INDEPENDENT_AMBULATORY_CARE_PROVIDER_SITE_OTHER): Payer: PPO | Admitting: Internal Medicine

## 2017-07-29 ENCOUNTER — Encounter: Payer: Self-pay | Admitting: Internal Medicine

## 2017-07-29 VITALS — BP 138/70 | HR 86 | Temp 98.0°F | Wt 203.0 lb

## 2017-07-29 DIAGNOSIS — M1000 Idiopathic gout, unspecified site: Secondary | ICD-10-CM

## 2017-07-29 DIAGNOSIS — Z86010 Personal history of colonic polyps: Secondary | ICD-10-CM

## 2017-07-29 DIAGNOSIS — I10 Essential (primary) hypertension: Secondary | ICD-10-CM | POA: Diagnosis not present

## 2017-07-29 DIAGNOSIS — M159 Polyosteoarthritis, unspecified: Secondary | ICD-10-CM

## 2017-07-29 DIAGNOSIS — M150 Primary generalized (osteo)arthritis: Secondary | ICD-10-CM

## 2017-07-29 LAB — CBC WITH DIFFERENTIAL/PLATELET
BASOS PCT: 0.9 % (ref 0.0–3.0)
Basophils Absolute: 0.1 10*3/uL (ref 0.0–0.1)
EOS PCT: 6 % — AB (ref 0.0–5.0)
Eosinophils Absolute: 0.5 10*3/uL (ref 0.0–0.7)
HCT: 40.4 % (ref 36.0–46.0)
HEMOGLOBIN: 13.7 g/dL (ref 12.0–15.0)
Lymphocytes Relative: 27.7 % (ref 12.0–46.0)
Lymphs Abs: 2.2 10*3/uL (ref 0.7–4.0)
MCHC: 33.8 g/dL (ref 30.0–36.0)
MCV: 89.1 fl (ref 78.0–100.0)
MONOS PCT: 6.2 % (ref 3.0–12.0)
Monocytes Absolute: 0.5 10*3/uL (ref 0.1–1.0)
Neutro Abs: 4.8 10*3/uL (ref 1.4–7.7)
Neutrophils Relative %: 59.2 % (ref 43.0–77.0)
Platelets: 325 10*3/uL (ref 150.0–400.0)
RBC: 4.54 Mil/uL (ref 3.87–5.11)
RDW: 13.6 % (ref 11.5–15.5)
WBC: 8.1 10*3/uL (ref 4.0–10.5)

## 2017-07-29 LAB — LIPID PANEL
CHOL/HDL RATIO: 3
Cholesterol: 159 mg/dL (ref 0–200)
HDL: 48.3 mg/dL (ref >39.00)
LDL CALC: 91 mg/dL (ref 0–99)
NonHDL: 110.77
Triglycerides: 98 mg/dL (ref 0.0–149.0)
VLDL: 19.6 mg/dL (ref 0.0–40.0)

## 2017-07-29 LAB — COMPREHENSIVE METABOLIC PANEL
ALT: 9 U/L (ref 0–35)
AST: 10 U/L (ref 0–37)
Albumin: 3.9 g/dL (ref 3.5–5.2)
Alkaline Phosphatase: 76 U/L (ref 39–117)
BUN: 19 mg/dL (ref 6–23)
CHLORIDE: 104 meq/L (ref 96–112)
CO2: 30 meq/L (ref 19–32)
Calcium: 9.6 mg/dL (ref 8.4–10.5)
Creatinine, Ser: 0.59 mg/dL (ref 0.40–1.20)
GFR: 104.13 mL/min (ref >60.00)
Glucose, Bld: 109 mg/dL — ABNORMAL HIGH (ref 70–99)
POTASSIUM: 4.3 meq/L (ref 3.5–5.1)
SODIUM: 142 meq/L (ref 135–145)
Total Bilirubin: 1 mg/dL (ref 0.2–1.2)
Total Protein: 6.6 g/dL (ref 6.0–8.3)

## 2017-07-29 LAB — URIC ACID: Uric Acid, Serum: 4.9 mg/dL (ref 2.4–7.0)

## 2017-07-29 LAB — TSH: TSH: 1.26 u[IU]/mL (ref 0.35–4.50)

## 2017-07-29 NOTE — Progress Notes (Signed)
Subjective:    Patient ID: Kristina Allison, female    DOB: [birth year], 82 y.o.   MRN: [id number]  HPI  82 year old patient who is seen today for her 62-month follow-up. She has a history of essential hypertension which has been well controlled on present regimen She is doing well today she does have osteoarthritis with a chief complaint of chronic right knee pain.  This is been fairly stable and helped by the use of a chronic right knee brace. Her last colonoscopy was January 2010.  A 10-year interval was recommended Doing well today except for some situational stress.  She does have a history of chronic venous insufficiency controlled with support hose.  She has a remote history of gout that has been stable.  She remains on allopurinol. She was seen earlier in the year and treated for a UTI  Past Medical History:  Diagnosis Date  . COLONIC POLYPS, HX OF 11/28/2006  . DVT 03/10/2007  . Edema 02/21/2007  . ESOPHAGITIS NOS 09/27/2006  . GOUT 06/04/2007  . HIP PAIN, LEFT 01/30/2007  . HYPERTENSION 11/28/2006  . OBESITY NOS 09/27/2006  . OSTEOARTHRITIS 11/28/2006  . OSTEOARTHROSIS, GENERALIZED, UNSPC SITE 09/27/2006  . PHLEBITIS&THROMBOPHLEBITIS LOWER EXTREM UNSPEC 03/07/2007  . STATE, SYMPTOMATIC MENOPAUSE/FEM CLIMACTERIC 09/27/2006  . VENOUS INSUFFICIENCY, CHRONIC, RIGHT LEG 09/06/2009     Social History   Socioeconomic History  . Marital status: Married    Spouse name: Not on file  . Number of children: Not on file  . Years of education: Not on file  . Highest education level: Not on file  Occupational History  . Not on file  Social Needs  . Financial resource strain: Not on file  . Food insecurity:    Worry: Not on file    Inability: Not on file  . Transportation needs:    Medical: Not on file    Non-medical: Not on file  Tobacco Use  . Smoking status: Never Smoker  . Smokeless tobacco: Never Used  Substance and Sexual Activity  . Alcohol use: No  . Drug use: No  . Sexual  activity: Not on file  Lifestyle  . Physical activity:    Days per week: Not on file    Minutes per session: Not on file  . Stress: Not on file  Relationships  . Social connections:    Talks on phone: Not on file    Gets together: Not on file    Attends religious service: Not on file    Active member of club or organization: Not on file    Attends meetings of clubs or organizations: Not on file    Relationship status: Not on file  . Intimate partner violence:    Fear of current or ex partner: Not on file    Emotionally abused: Not on file    Physically abused: Not on file    Forced sexual activity: Not on file  Other Topics Concern  . Not on file  Social History Narrative  . Not on file    History reviewed. No pertinent surgical history.  History reviewed. No pertinent family history.  Allergies  Allergen Reactions  . Sulfacetamide Sodium Itching and Rash  . Sulfamethoxazole-Trimethoprim Itching and Rash    Current Outpatient Medications on File Prior to Visit  Medication Sig Dispense Refill  . allopurinol (ZYLOPRIM) 100 MG tablet TAKE 1 TABLET(100 MG) BY MOUTH DAILY 90 tablet 3  . amLODipine (NORVASC) 2.5 MG tablet TAKE 1 TABLET(2.5 MG) BY  MOUTH DAILY 90 tablet 0  . clobetasol cream (TEMOVATE) 0.05 % Apply topically 2 (two) times daily. 30 g 3  . latanoprost (XALATAN) 0.005 % ophthalmic solution Place 1 drop into both eyes at bedtime. 2.5 mL 3  . losartan-hydrochlorothiazide (HYZAAR) 100-12.5 MG tablet Take 1 tablet by mouth daily. 90 tablet 3  . potassium chloride SA (K-DUR,KLOR-CON) 20 MEQ tablet TAKE 1 TABLET(20 MEQ) BY MOUTH DAILY 90 tablet 3   No current facility-administered medications on file prior to visit.     BP 138/70 (BP Location: Right Arm, Patient Position: Sitting, Cuff Size: Large)   Pulse 86   Temp 98 F (36.7 C) (Oral)   Wt 203 lb (92.1 kg)   SpO2 96%   BMI 33.78 kg/m     Review of Systems  Constitutional: Negative.   HENT: Negative for  congestion, dental problem, hearing loss, rhinorrhea, sinus pressure, sore throat and tinnitus.   Eyes: Negative for pain, discharge and visual disturbance.  Respiratory: Negative for cough and shortness of breath.   Cardiovascular: Negative for chest pain, palpitations and leg swelling.  Gastrointestinal: Negative for abdominal distention, abdominal pain, blood in stool, constipation, diarrhea, nausea and vomiting.  Genitourinary: Negative for difficulty urinating, dysuria, flank pain, frequency, hematuria, pelvic pain, urgency, vaginal bleeding, vaginal discharge and vaginal pain.  Musculoskeletal: Positive for arthralgias. Negative for gait problem and joint swelling.  Skin: Negative for rash.  Neurological: Negative for dizziness, syncope, speech difficulty, weakness, numbness and headaches.  Hematological: Negative for adenopathy.  Psychiatric/Behavioral: Negative for agitation, behavioral problems and dysphoric mood. The patient is not nervous/anxious.        Objective:   Physical Exam  Constitutional: She is oriented to person, place, and time. She appears well-developed and well-nourished.    weight 203  Blood pressure 140/60  HENT:  Head: Normocephalic.  Right Ear: External ear normal.  Left Ear: External ear normal.  Mouth/Throat: Oropharynx is clear and moist.  Eyes: Pupils are equal, round, and reactive to light. Conjunctivae and EOM are normal.  Neck: Normal range of motion. Neck supple. No thyromegaly present.  Cardiovascular: Normal rate, regular rhythm, normal heart sounds and intact distal pulses.  Pulmonary/Chest: Effort normal and breath sounds normal.  Abdominal: Soft. Bowel sounds are normal. She exhibits no mass. There is no tenderness.  Musculoskeletal: Normal range of motion.  Right knee brace and support hose  Lymphadenopathy:    She has no cervical adenopathy.  Neurological: She is alert and oriented to person, place, and time.  Skin: Skin is warm and  dry. No rash noted.  Psychiatric: She has a normal mood and affect. Her behavior is normal.          Assessment & Plan:   Essential hypertension.  Stable no change in regimen Venous insufficiency.  Continue support hose History of gout stable.  Will check a uric acid level Osteoarthritis  Medications updated Will check updated lab  Follow-up 6 months  [id number],PETER Pilar Plate

## 2017-07-29 NOTE — Patient Instructions (Signed)
Limit your sodium (Salt) intake    It is important that you exercise regularly, at least 20 minutes 3 to 4 times per week.  If you develop chest pain or shortness of breath seek  medical attention.  Please check your blood pressure on a regular basis.  If it is consistently greater than 150/90, please make an office appointment.  Return in 6 months for follow-up   

## 2017-07-30 ENCOUNTER — Other Ambulatory Visit: Payer: Self-pay | Admitting: Internal Medicine

## 2017-07-31 ENCOUNTER — Encounter: Payer: Self-pay | Admitting: Internal Medicine

## 2017-07-31 DIAGNOSIS — Z803 Family history of malignant neoplasm of breast: Secondary | ICD-10-CM | POA: Diagnosis not present

## 2017-07-31 DIAGNOSIS — Z1231 Encounter for screening mammogram for malignant neoplasm of breast: Secondary | ICD-10-CM | POA: Diagnosis not present

## 2017-10-29 ENCOUNTER — Other Ambulatory Visit: Payer: Self-pay | Admitting: Internal Medicine

## 2017-11-29 ENCOUNTER — Ambulatory Visit (INDEPENDENT_AMBULATORY_CARE_PROVIDER_SITE_OTHER): Payer: PPO

## 2017-11-29 DIAGNOSIS — Z23 Encounter for immunization: Secondary | ICD-10-CM

## 2017-12-02 DIAGNOSIS — H401131 Primary open-angle glaucoma, bilateral, mild stage: Secondary | ICD-10-CM | POA: Diagnosis not present

## 2017-12-27 ENCOUNTER — Other Ambulatory Visit: Payer: Self-pay | Admitting: Internal Medicine

## 2018-01-01 ENCOUNTER — Ambulatory Visit (INDEPENDENT_AMBULATORY_CARE_PROVIDER_SITE_OTHER): Payer: PPO | Admitting: Adult Health

## 2018-01-01 ENCOUNTER — Encounter: Payer: Self-pay | Admitting: Adult Health

## 2018-01-01 VITALS — BP 120/62 | HR 96 | Temp 98.2°F | Ht 65.0 in | Wt 202.8 lb

## 2018-01-01 DIAGNOSIS — M159 Polyosteoarthritis, unspecified: Secondary | ICD-10-CM

## 2018-01-01 DIAGNOSIS — M150 Primary generalized (osteo)arthritis: Secondary | ICD-10-CM

## 2018-01-01 DIAGNOSIS — M1000 Idiopathic gout, unspecified site: Secondary | ICD-10-CM | POA: Diagnosis not present

## 2018-01-01 DIAGNOSIS — T148XXA Other injury of unspecified body region, initial encounter: Secondary | ICD-10-CM

## 2018-01-01 DIAGNOSIS — I10 Essential (primary) hypertension: Secondary | ICD-10-CM | POA: Diagnosis not present

## 2018-01-01 DIAGNOSIS — Z7689 Persons encountering health services in other specified circumstances: Secondary | ICD-10-CM | POA: Diagnosis not present

## 2018-01-01 MED ORDER — LOSARTAN POTASSIUM-HCTZ 100-12.5 MG PO TABS: 1.0000 | ORAL_TABLET | Freq: Every day | ORAL | 3 refills | Status: DC

## 2018-01-01 MED ORDER — ALLOPURINOL 100 MG PO TABS: ORAL_TABLET | ORAL | 3 refills | Status: DC

## 2018-01-01 MED ORDER — METHYLPREDNISOLONE 4 MG PO TBPK: ORAL_TABLET | ORAL | 0 refills | Status: DC

## 2018-01-01 MED ORDER — POTASSIUM CHLORIDE CRYS ER 20 MEQ PO TBCR: EXTENDED_RELEASE_TABLET | ORAL | 3 refills | Status: DC

## 2018-01-01 MED ORDER — AMLODIPINE BESYLATE 2.5 MG PO TABS: ORAL_TABLET | ORAL | 3 refills | Status: DC

## 2018-01-01 NOTE — Progress Notes (Signed)
Patient presents to clinic today to establish care. She is a pleasant 82 year old female who  has a past medical history of COLONIC POLYPS, HX OF (11/28/2006), DVT (03/10/2007), Edema (02/21/2007), ESOPHAGITIS NOS (09/27/2006), GOUT (06/04/2007), HIP PAIN, LEFT (01/30/2007), HYPERTENSION (11/28/2006), OBESITY NOS (09/27/2006), OSTEOARTHRITIS (11/28/2006), OSTEOARTHROSIS, GENERALIZED, UNSPC SITE (09/27/2006), PHLEBITIS&THROMBOPHLEBITIS LOWER EXTREM UNSPEC (03/07/2007), STATE, SYMPTOMATIC MENOPAUSE/FEM CLIMACTERIC (09/27/2006), and VENOUS INSUFFICIENCY, CHRONIC, RIGHT LEG (09/06/2009).   She is a former patient of Dr. Raliegh Ip who is transferring care.   She was being seen on a biannual basis, last in June 2019 for CPE    Acute Concerns: Establish Care   Right bicep pain - has been present for 2-3 weeks. Pain has stayed constant. Pain is described as sharp and happens with certain arm movements. She denies falls or trauma. Believes she may have strained it when she was picking her demented husband up from the floor. Has trouble with using her right arm.   Chronic Issues:  Essential hypertension- controlled with Hyzaar 100-12 0.5 and Norvasc 2.5 mg  BP Readings from Last 3 Encounters:  01/01/18 120/62  07/29/17 138/70  02/25/17 138/66   Gout -recent gout flares.  Controlled with allopurinol 100 mg daily  Osteoarthritis - mostly in hips and knees.   DVT - remote history in 2009. No longer on blood thinner.   Glaucoma - Is seen by [facility] Maintenance: Dental -- Does not do routine care Vision -- Routine  Immunizations -- UTD  Colonoscopy -- No longer needs Mammogram -- 07/2017  PAP -- No longer needs Bone Density -- Refuses  Diet: Tries to eat healthy  Exercise: Finds it hard to exercise due to arthritis.    Past Medical History:  Diagnosis Date  . COLONIC POLYPS, HX OF 11/28/2006  . DVT 03/10/2007  . Edema 02/21/2007  . ESOPHAGITIS NOS 09/27/2006  . GOUT 06/04/2007  . HIP PAIN,  LEFT 01/30/2007  . HYPERTENSION 11/28/2006  . OBESITY NOS 09/27/2006  . OSTEOARTHRITIS 11/28/2006  . OSTEOARTHROSIS, GENERALIZED, UNSPC SITE 09/27/2006  . PHLEBITIS&THROMBOPHLEBITIS LOWER EXTREM UNSPEC 03/07/2007  . STATE, SYMPTOMATIC MENOPAUSE/FEM CLIMACTERIC 09/27/2006  . VENOUS INSUFFICIENCY, CHRONIC, RIGHT LEG 09/06/2009    History reviewed. No pertinent surgical history.  Current Outpatient Medications on File Prior to Visit  Medication Sig Dispense Refill  . allopurinol (ZYLOPRIM) 100 MG tablet TAKE 1 TABLET(100 MG) BY MOUTH DAILY 90 tablet 3  . amLODipine (NORVASC) 2.5 MG tablet TAKE 1 TABLET(2.5 MG) BY MOUTH DAILY 90 tablet 0  . clobetasol cream (TEMOVATE) 0.05 % Apply topically 2 (two) times daily. 30 g 3  . latanoprost (XALATAN) 0.005 % ophthalmic solution INSTILL 1 DROP IN BOTH EYES AT BEDTIME 10 mL 1  . losartan-hydrochlorothiazide (HYZAAR) 100-12.5 MG tablet Take 1 tablet by mouth daily. 90 tablet 3  . potassium chloride SA (K-DUR,KLOR-CON) 20 MEQ tablet TAKE 1 TABLET(20 MEQ) BY MOUTH DAILY 90 tablet 3   No current facility-administered medications on file prior to visit.     Allergies  Allergen Reactions  . Ciprofloxacin     Made her feel like she was in [place],nausea  . Macrobid [Nitrofurantoin Macrocrystal]     Made her feel like she was in [place],nausea  . Sulfacetamide Sodium Itching and Rash  . Sulfamethoxazole-Trimethoprim Itching and Rash    History reviewed. No pertinent family history.  Social History   Socioeconomic History  . Marital status: Married    Spouse name: Not on file  .  Number of children: Not on file  . Years of education: Not on file  . Highest education level: Not on file  Occupational History  . Not on file  Social Needs  . Financial resource strain: Not on file  . Food insecurity:    Worry: Not on file    Inability: Not on file  . Transportation needs:    Medical: Not on file    Non-medical: Not on file  Tobacco Use  . Smoking  status: Never Smoker  . Smokeless tobacco: Never Used  Substance and Sexual Activity  . Alcohol use: No  . Drug use: No  . Sexual activity: Not on file  Lifestyle  . Physical activity:    Days per week: Not on file    Minutes per session: Not on file  . Stress: Not on file  Relationships  . Social connections:    Talks on phone: Not on file    Gets together: Not on file    Attends religious service: Not on file    Active member of club or organization: Not on file    Attends meetings of clubs or organizations: Not on file    Relationship status: Not on file  . Intimate partner violence:    Fear of current or ex partner: Not on file    Emotionally abused: Not on file    Physically abused: Not on file    Forced sexual activity: Not on file  Other Topics Concern  . Not on file  Social History Narrative  . Not on file    Review of Systems  Constitutional: Negative.   HENT: Negative.   Eyes: Negative.   Respiratory: Negative.   Cardiovascular: Negative.   Gastrointestinal: Negative.   Genitourinary: Negative.   Musculoskeletal: Positive for joint pain.  Skin: Negative.   Neurological: Negative.   Psychiatric/Behavioral: Negative.   All other systems reviewed and are negative.     BP 120/62 (BP Location: Left Arm, Patient Position: Sitting, Cuff Size: Large)   Pulse 96   Temp 98.2 F (36.8 C) (Oral)   Ht 5\' 5"  (1.651 m)   Wt 202 lb 12.8 oz (92 kg)   BMI 33.75 kg/m   Physical Exam  Constitutional: She is oriented to person, place, and time. She appears well-developed and well-nourished. No distress.  HENT:  Head: Normocephalic and atraumatic.  Right Ear: External ear normal.  Left Ear: External ear normal.  Nose: Nose normal.  Mouth/Throat: Oropharynx is clear and moist. No oropharyngeal exudate.  Eyes: Pupils are equal, round, and reactive to light. Conjunctivae and EOM are normal. Right eye exhibits no discharge. Left eye exhibits no discharge. No scleral  icterus.  Neck: Normal range of motion. Neck supple. No JVD present. No tracheal deviation present. No thyromegaly present.  Cardiovascular: Normal rate, regular rhythm, normal heart sounds and intact distal pulses. Exam reveals no gallop and no friction rub.  No murmur heard. Pulmonary/Chest: Effort normal and breath sounds normal. No stridor. No respiratory distress. She has no wheezes. She has no rales. She exhibits no tenderness.  Abdominal: She exhibits no distension and no mass. There is no tenderness. There is no rebound and no guarding. No hernia.  Musculoskeletal: Normal range of motion. She exhibits edema. She exhibits no tenderness or deformity.  Tenderness along right bicep. Is able to flex bicep without much difficulty. Pain when raising arm above her head but no pain in shoulder. Good distal pulses.   Lymphadenopathy:    She  has no cervical adenopathy.  Neurological: She is alert and oriented to person, place, and time. She displays normal reflexes. No cranial nerve deficit or sensory deficit. She exhibits normal muscle tone. Coordination normal.  Skin: Skin is warm and dry. Capillary refill takes less than 2 seconds. No rash noted. She is not diaphoretic. No erythema. No pallor.  No bruising noted.   Psychiatric: She has a normal mood and affect. Her behavior is normal. Judgment and thought content normal.  Nursing note and vitals reviewed.   Assessment/Plan:  1. Encounter to establish care - Follow up in June for CPE   2. Essential hypertension - Well controlled. No change in medications   3. Primary osteoarthritis involving multiple joints - can take Motrin PRN   4. Idiopathic gout, unspecified chronicity, unspecified site - Continue with allopurinol   5. Muscle strain - methylPREDNISolone (MEDROL DOSEPAK) 4 MG TBPK tablet; Take as directed  Dispense: 21 tablet; Refill: 0 - Follow up if no improvement in the next week  - Ok to use motrin and heat   Kristina Peng,  NP

## 2018-01-07 ENCOUNTER — Telehealth: Payer: Self-pay | Admitting: Internal Medicine

## 2018-01-07 DIAGNOSIS — M25511 Pain in right shoulder: Secondary | ICD-10-CM

## 2018-01-07 NOTE — Telephone Encounter (Signed)
Copied from [place] [id number] [phone]. Topic: General - Other >> Jan 07, 2018 11:14 AM Alfredia Ferguson R wrote: Patient is calling in stating she is still having issues with her shoulder. She states its not as much but the issues are still there with the pain when she tries to raise arm to do anything with it

## 2018-01-08 NOTE — Telephone Encounter (Signed)
Noted  

## 2018-01-08 NOTE — Telephone Encounter (Signed)
This patient can call herself and make an appointment to see her orthopedist

## 2018-01-08 NOTE — Telephone Encounter (Signed)
Please advise 

## 2018-01-16 ENCOUNTER — Ambulatory Visit (INDEPENDENT_AMBULATORY_CARE_PROVIDER_SITE_OTHER): Payer: PPO

## 2018-01-16 ENCOUNTER — Encounter (INDEPENDENT_AMBULATORY_CARE_PROVIDER_SITE_OTHER): Payer: Self-pay | Admitting: Orthopaedic Surgery

## 2018-01-16 ENCOUNTER — Ambulatory Visit (INDEPENDENT_AMBULATORY_CARE_PROVIDER_SITE_OTHER): Payer: PPO | Admitting: Orthopaedic Surgery

## 2018-01-16 DIAGNOSIS — M25511 Pain in right shoulder: Secondary | ICD-10-CM | POA: Diagnosis not present

## 2018-01-16 DIAGNOSIS — M79621 Pain in right upper arm: Secondary | ICD-10-CM

## 2018-01-16 DIAGNOSIS — M7541 Impingement syndrome of right shoulder: Secondary | ICD-10-CM | POA: Diagnosis not present

## 2018-01-16 DIAGNOSIS — M898X2 Other specified disorders of bone, upper arm: Secondary | ICD-10-CM

## 2018-01-16 MED ORDER — METHYLPREDNISOLONE ACETATE 40 MG/ML IJ SUSP
40.0000 mg | INTRAMUSCULAR | Status: AC | PRN
  Administered 2018-01-16: 40 mg via INTRA_ARTICULAR

## 2018-01-16 MED ORDER — LIDOCAINE HCL 1 % IJ SOLN
3.0000 mL | INTRAMUSCULAR | Status: AC | PRN
  Administered 2018-01-16: 3 mL

## 2018-01-16 NOTE — Progress Notes (Signed)
Office Visit Note   Patient: Kristina Allison           Date of Birth: 1935-12-25           MRN: [id number] Visit Date: 01/16/2018              Requested by: Marletta Lor, MD [place], [place] [address] PCP: Dorothyann Peng, NP   Assessment & Plan: Visit Diagnoses:  1. Right shoulder pain, unspecified chronicity   2. Pain of right humerus   3. Impingement syndrome of right shoulder     Plan: She certainly has a combination of impingement syndrome of this right shoulder and probably chronic rotator cuff tearing.  I recommended a subacromial steroid injection to help ease her pain and she hopes to try this and not go through any therapy as long as her pain is decreasing.  She tolerated the injection well.  We will see her back in 4 weeks as he has been overall.  Follow-Up Instructions: Return in about 4 weeks (around 02/13/2018).   Orders:  Orders Placed This Encounter  Procedures  . Large Joint Inj  . XR Shoulder Right  . XR Humerus Right   No orders of the defined types were placed in this encounter.     Procedures: Large Joint Inj: R subacromial bursa on 01/16/2018 4:27 PM Indications: pain and diagnostic evaluation Details: 22 G 1.5 in needle  Arthrogram: No  Medications: 3 mL lidocaine 1 %; 40 mg methylPREDNISolone acetate 40 MG/ML Outcome: tolerated well, no immediate complications Procedure, treatment alternatives, risks and benefits explained, specific risks discussed. Consent was given by the patient. Immediately prior to procedure a time out was called to verify the correct patient, procedure, equipment, support staff and site/side marked as required. Patient was prepped and draped in the usual sterile fashion.       Clinical Data: No additional findings.   Subjective: Chief Complaint  Patient presents with  . Right Shoulder - Pain  The patient is a very pleasant 82 year old female with a 3 to 4-week history of worsening right  shoulder and upper arm pain.  She denies any injury or recent illness.  It hurts mostly with overhead activities and activities daily living.  It is an aching type of pain that definitely affects her being able to do things like put her clothes on and wash her hair.  It hurts into the mid humerus area of that right arm.  She can lift her arm above her head but is very painful to do that.  She denies any numbness and tingling in her hands.  She denies any neck pain.  She is not a diabetic.  Again she is never injured this before.  HPI  Review of Systems She currently denies any headache, chest pain, shortness of breath, fever, chills, nausea, vomiting.  Objective: Vital Signs: There were no vitals taken for this visit.  Physical Exam She is alert and oriented x3 and in no acute distress Ortho Exam Examination of her right shoulder does show positive Neer and Hawkins signs and significant pain in the subacromial outlet.  There is some weakness in the rotator cuff as well on exam.  Her shoulder is well located otherwise.  Distally her motor and sensory exam are normal.  Her elbow exam is normal. Specialty Comments:  No specialty comments available.  Imaging: Xr Humerus Right  Result Date: 01/16/2018 2 views of the right humerus show no acute findings.  Xr  Shoulder Right  Result Date: 01/16/2018 3 views of the right shoulder show no acute findings.  The shoulder is well located.  There is significant acromioclavicular joint arthritic changes and bone spurs.    PMFS History: Patient Active Problem List   Diagnosis Date Noted  . Venous (peripheral) insufficiency 09/06/2009  . GOUT 06/04/2007  . DVT 03/10/2007  . PHLEBITIS&THROMBOPHLEBITIS LOWER EXTREM UNSPEC 03/07/2007  . Essential hypertension 11/28/2006  . Osteoarthritis 11/28/2006  . History of colonic polyps 11/28/2006  . OBESITY NOS 09/27/2006  . ESOPHAGITIS NOS 09/27/2006  . STATE, SYMPTOMATIC MENOPAUSE/FEM CLIMACTERIC  09/27/2006   Past Medical History:  Diagnosis Date  . COLONIC POLYPS, HX OF 11/28/2006  . DVT 03/10/2007  . Edema 02/21/2007  . ESOPHAGITIS NOS 09/27/2006  . GOUT 06/04/2007  . HIP PAIN, LEFT 01/30/2007  . HYPERTENSION 11/28/2006  . OBESITY NOS 09/27/2006  . OSTEOARTHRITIS 11/28/2006  . OSTEOARTHROSIS, GENERALIZED, UNSPC SITE 09/27/2006  . PHLEBITIS&THROMBOPHLEBITIS LOWER EXTREM UNSPEC 03/07/2007  . STATE, SYMPTOMATIC MENOPAUSE/FEM CLIMACTERIC 09/27/2006  . VENOUS INSUFFICIENCY, CHRONIC, RIGHT LEG 09/06/2009    History reviewed. No pertinent family history.  Past Surgical History:  Procedure Laterality Date  . ABDOMINAL HYSTERECTOMY     Social History   Occupational History  . Not on file  Tobacco Use  . Smoking status: Never Smoker  . Smokeless tobacco: Never Used  Substance and Sexual Activity  . Alcohol use: No  . Drug use: No  . Sexual activity: Not on file

## 2018-01-29 ENCOUNTER — Other Ambulatory Visit: Payer: Self-pay | Admitting: Internal Medicine

## 2018-02-13 ENCOUNTER — Ambulatory Visit (INDEPENDENT_AMBULATORY_CARE_PROVIDER_SITE_OTHER): Payer: PPO | Admitting: Orthopaedic Surgery

## 2018-02-13 ENCOUNTER — Encounter (INDEPENDENT_AMBULATORY_CARE_PROVIDER_SITE_OTHER): Payer: Self-pay | Admitting: Orthopaedic Surgery

## 2018-02-13 DIAGNOSIS — M7541 Impingement syndrome of right shoulder: Secondary | ICD-10-CM | POA: Diagnosis not present

## 2018-02-13 DIAGNOSIS — M25511 Pain in right shoulder: Secondary | ICD-10-CM | POA: Diagnosis not present

## 2018-02-13 NOTE — Progress Notes (Signed)
Patient is a very pleasant 82 year old female that I am seeing for weeks after having an injection in her right shoulder.  This was a subacromial steroid injection for impingement syndrome.  She says she still having some pain in her shoulder but overall she does feel the injections helped her significantly.  On exam her range of motion is unlimited by pain but she can fully abduct and externally rotate her shoulder.  She can reach behind herself to the mid thoracic spine area.  It is painful to do this.  There is some slight weakness in the rotator cuff which is likely age-related as well.  She is not a diabetic so I would consider a repeat steroid injection 4 weeks from now in the right shoulder.  I do not feel she needs physical therapy because her motion is great and she is had in the right direction.  Obviously 4 weeks now she is feeling great she does not need to keep that appointment if she still having some shoulder pain I would not mind providing a steroid injection in the right shoulder subacromial space.  All question concerns were answered and addressed.

## 2018-02-24 ENCOUNTER — Other Ambulatory Visit: Payer: Self-pay | Admitting: Internal Medicine

## 2018-02-24 DIAGNOSIS — I10 Essential (primary) hypertension: Secondary | ICD-10-CM

## 2018-02-28 NOTE — Telephone Encounter (Signed)
Pt has been out of med for about a wk

## 2018-02-28 NOTE — Telephone Encounter (Signed)
Spoke with Maren Beach at [facility] who states that prescription written on 01/01/18 was not received. Prescription resent to pharmacy.

## 2018-03-13 ENCOUNTER — Ambulatory Visit (INDEPENDENT_AMBULATORY_CARE_PROVIDER_SITE_OTHER): Payer: PPO | Admitting: Orthopaedic Surgery

## 2018-03-13 ENCOUNTER — Encounter (INDEPENDENT_AMBULATORY_CARE_PROVIDER_SITE_OTHER): Payer: Self-pay | Admitting: Orthopaedic Surgery

## 2018-03-13 DIAGNOSIS — M7541 Impingement syndrome of right shoulder: Secondary | ICD-10-CM | POA: Insufficient documentation

## 2018-03-13 MED ORDER — METHYLPREDNISOLONE ACETATE 40 MG/ML IJ SUSP
40.0000 mg | INTRAMUSCULAR | Status: AC | PRN
  Administered 2018-03-13: 40 mg via INTRA_ARTICULAR

## 2018-03-13 MED ORDER — LIDOCAINE HCL 1 % IJ SOLN
3.0000 mL | INTRAMUSCULAR | Status: AC | PRN
  Administered 2018-03-13: 3 mL

## 2018-03-13 NOTE — Progress Notes (Signed)
Office Visit Note   Patient: Kristina Allison           Date of Birth: 09/29/1935           MRN: [id number] Visit Date: 03/13/2018              Requested by: Dorothyann Peng, NP [place] [id number] PCP: Dorothyann Peng, NP   Assessment & Plan: Visit Diagnoses:  1. Impingement syndrome of right shoulder     Plan: I did provide 1 more steroid injection into her right shoulder which I think is reasonable in the subacromial space.  She is not interested in any therapy or surgical intervention.  She understands that we would not provide another injection for least 4 months and if she needs 1 then to not hesitate to give [place] a call.  All question concerns were answered and addressed.  Follow-Up Instructions: Return if symptoms worsen or fail to improve.   Orders:  Orders Placed This Encounter  Procedures  . Large Joint Inj   No orders of the defined types were placed in this encounter.     Procedures: Large Joint Inj: R subacromial bursa on 03/13/2018 2:21 PM Indications: pain and diagnostic evaluation Details: 22 G 1.5 in needle  Arthrogram: No  Medications: 3 mL lidocaine 1 %; 40 mg methylPREDNISolone acetate 40 MG/ML Outcome: tolerated well, no immediate complications Procedure, treatment alternatives, risks and benefits explained, specific risks discussed. Consent was given by the patient. Immediately prior to procedure a time out was called to verify the correct patient, procedure, equipment, support staff and site/side marked as required. Patient was prepped and draped in the usual sterile fashion.       Clinical Data: No additional findings.   Subjective: Chief Complaint  Patient presents with  . Right Shoulder - Follow-up  The patient is well-known to me.  She has a history of right shoulder pain and impingement syndrome.  She had an injection in that shoulder back in November and it helped significantly.  She still experiencing some  shoulder pain and would like to at least try 1 more injection.  She is not a diabetic.  She reports mainly pain with reaching overhead and across in front of her.  She denies any injuries.  She also denies any hand numbness on the right side or any neck pain.  HPI  Review of Systems She currently denies any headache, chest pain, shortness of breath, fever, chills, nausea, vomiting  Objective: Vital Signs: There were no vitals taken for this visit.  Physical Exam She is alert oriented x3 and in no acute distress Ortho Exam Examination of her right shoulder shows positive signs of impingement but no significant weakness in the cuff with good motion overall. Specialty Comments:  No specialty comments available.  Imaging: No results found.   PMFS History: Patient Active Problem List   Diagnosis Date Noted  . Impingement syndrome of right shoulder 03/13/2018  . Venous (peripheral) insufficiency 09/06/2009  . GOUT 06/04/2007  . DVT 03/10/2007  . PHLEBITIS&THROMBOPHLEBITIS LOWER EXTREM UNSPEC 03/07/2007  . Essential hypertension 11/28/2006  . Osteoarthritis 11/28/2006  . History of colonic polyps 11/28/2006  . OBESITY NOS 09/27/2006  . ESOPHAGITIS NOS 09/27/2006  . STATE, SYMPTOMATIC MENOPAUSE/FEM CLIMACTERIC 09/27/2006   Past Medical History:  Diagnosis Date  . COLONIC POLYPS, HX OF 11/28/2006  . DVT 03/10/2007  . Edema 02/21/2007  . ESOPHAGITIS NOS 09/27/2006  . GOUT 06/04/2007  . HIP PAIN, LEFT  01/30/2007  . HYPERTENSION 11/28/2006  . OBESITY NOS 09/27/2006  . OSTEOARTHRITIS 11/28/2006  . OSTEOARTHROSIS, GENERALIZED, UNSPC SITE 09/27/2006  . PHLEBITIS&THROMBOPHLEBITIS LOWER EXTREM UNSPEC 03/07/2007  . STATE, SYMPTOMATIC MENOPAUSE/FEM CLIMACTERIC 09/27/2006  . VENOUS INSUFFICIENCY, CHRONIC, RIGHT LEG 09/06/2009    History reviewed. No pertinent family history.  Past Surgical History:  Procedure Laterality Date  . ABDOMINAL HYSTERECTOMY     Social History   Occupational History  .  Not on file  Tobacco Use  . Smoking status: Never Smoker  . Smokeless tobacco: Never Used  Substance and Sexual Activity  . Alcohol use: No  . Drug use: No  . Sexual activity: Not on file

## 2018-05-28 ENCOUNTER — Telehealth: Payer: Self-pay

## 2018-05-28 NOTE — Telephone Encounter (Signed)
Author phoned pt. to assess interest in scheduling virtual awv. Pt. Stated she does not have a computer or smartphone to do that, but would be willing to schedule for later in year. Appointment made for 8/7 with [facility].

## 2018-07-25 DIAGNOSIS — H401131 Primary open-angle glaucoma, bilateral, mild stage: Secondary | ICD-10-CM | POA: Diagnosis not present

## 2018-07-30 ENCOUNTER — Encounter: Payer: PPO | Admitting: Adult Health

## 2018-09-18 ENCOUNTER — Encounter: Payer: Self-pay | Admitting: Adult Health

## 2018-09-18 DIAGNOSIS — Z803 Family history of malignant neoplasm of breast: Secondary | ICD-10-CM | POA: Diagnosis not present

## 2018-09-18 DIAGNOSIS — Z1231 Encounter for screening mammogram for malignant neoplasm of breast: Secondary | ICD-10-CM | POA: Diagnosis not present

## 2018-09-18 LAB — HM MAMMOGRAPHY

## 2018-09-24 ENCOUNTER — Encounter: Payer: Self-pay | Admitting: Family Medicine

## 2018-09-26 ENCOUNTER — Encounter: Payer: Self-pay | Admitting: Adult Health

## 2018-09-26 DIAGNOSIS — N6001 Solitary cyst of right breast: Secondary | ICD-10-CM | POA: Diagnosis not present

## 2018-09-26 DIAGNOSIS — N6314 Unspecified lump in the right breast, lower inner quadrant: Secondary | ICD-10-CM | POA: Diagnosis not present

## 2018-10-02 ENCOUNTER — Encounter: Payer: Self-pay | Admitting: Adult Health

## 2018-10-02 ENCOUNTER — Other Ambulatory Visit: Payer: Self-pay | Admitting: Adult Health

## 2018-10-02 ENCOUNTER — Other Ambulatory Visit: Payer: Self-pay

## 2018-10-02 ENCOUNTER — Ambulatory Visit (INDEPENDENT_AMBULATORY_CARE_PROVIDER_SITE_OTHER): Payer: PPO | Admitting: Adult Health

## 2018-10-02 VITALS — BP 140/70 | Temp 98.0°F | Ht 65.75 in | Wt 215.0 lb

## 2018-10-02 DIAGNOSIS — M1000 Idiopathic gout, unspecified site: Secondary | ICD-10-CM

## 2018-10-02 DIAGNOSIS — Z0000 Encounter for general adult medical examination without abnormal findings: Secondary | ICD-10-CM | POA: Diagnosis not present

## 2018-10-02 DIAGNOSIS — M150 Primary generalized (osteo)arthritis: Secondary | ICD-10-CM

## 2018-10-02 DIAGNOSIS — I10 Essential (primary) hypertension: Secondary | ICD-10-CM

## 2018-10-02 DIAGNOSIS — M159 Polyosteoarthritis, unspecified: Secondary | ICD-10-CM

## 2018-10-02 DIAGNOSIS — N3281 Overactive bladder: Secondary | ICD-10-CM

## 2018-10-02 LAB — LIPID PANEL
Cholesterol: 146 mg/dL (ref 0–200)
HDL: 49.3 mg/dL (ref >39.00)
LDL Cholesterol: 83 mg/dL (ref 0–99)
NonHDL: 97.15
Total CHOL/HDL Ratio: 3
Triglycerides: 69 mg/dL (ref 0.0–149.0)
VLDL: 13.8 mg/dL (ref 0.0–40.0)

## 2018-10-02 LAB — CBC WITH DIFFERENTIAL/PLATELET
Basophils Absolute: 0.1 10*3/uL (ref 0.0–0.1)
Basophils Relative: 0.7 % (ref 0.0–3.0)
Eosinophils Absolute: 0.5 10*3/uL (ref 0.0–0.7)
Eosinophils Relative: 5 % (ref 0.0–5.0)
HCT: 42.4 % (ref 36.0–46.0)
Hemoglobin: 14.2 g/dL (ref 12.0–15.0)
Lymphocytes Relative: 29.2 % (ref 12.0–46.0)
Lymphs Abs: 2.7 10*3/uL (ref 0.7–4.0)
MCHC: 33.5 g/dL (ref 30.0–36.0)
MCV: 89.9 fl (ref 78.0–100.0)
Monocytes Absolute: 0.6 10*3/uL (ref 0.1–1.0)
Monocytes Relative: 6.5 % (ref 3.0–12.0)
Neutro Abs: 5.5 10*3/uL (ref 1.4–7.7)
Neutrophils Relative %: 58.6 % (ref 43.0–77.0)
Platelets: 326 10*3/uL (ref 150.0–400.0)
RBC: 4.71 Mil/uL (ref 3.87–5.11)
RDW: 13.7 % (ref 11.5–15.5)
WBC: 9.4 10*3/uL (ref 4.0–10.5)

## 2018-10-02 LAB — COMPREHENSIVE METABOLIC PANEL
ALT: 14 U/L (ref 0–35)
AST: 15 U/L (ref 0–37)
Albumin: 4 g/dL (ref 3.5–5.2)
Alkaline Phosphatase: 74 U/L (ref 39–117)
BUN: 19 mg/dL (ref 6–23)
CO2: 28 mEq/L (ref 19–32)
Calcium: 10 mg/dL (ref 8.4–10.5)
Chloride: 104 mEq/L (ref 96–112)
Creatinine, Ser: 0.67 mg/dL (ref 0.40–1.20)
GFR: 84.35 mL/min (ref >60.00)
Glucose, Bld: 103 mg/dL — ABNORMAL HIGH (ref 70–99)
Potassium: 4.4 mEq/L (ref 3.5–5.1)
Sodium: 140 mEq/L (ref 135–145)
Total Bilirubin: 0.8 mg/dL (ref 0.2–1.2)
Total Protein: 7.1 g/dL (ref 6.0–8.3)

## 2018-10-02 LAB — TSH: TSH: 1.21 u[IU]/mL (ref 0.35–4.50)

## 2018-10-02 MED ORDER — MIRABEGRON ER 25 MG PO TB24: 25.0000 mg | ORAL_TABLET | Freq: Every day | ORAL | 0 refills | Status: DC

## 2018-10-02 MED ORDER — MELOXICAM 7.5 MG PO TABS: 7.5000 mg | ORAL_TABLET | Freq: Every day | ORAL | 0 refills | Status: DC

## 2018-10-02 NOTE — Progress Notes (Signed)
Subjective:    Patient ID: Livengood Lions, female    DOB: 1935/09/12, 83 y.o.   MRN: [id number]  HPI Patient presents for yearly preventative medicine examination. She is a pleasant 83 year old female who  has a past medical history of COLONIC POLYPS, HX OF (11/28/2006), DVT (03/10/2007), Edema (02/21/2007), ESOPHAGITIS NOS (09/27/2006), GOUT (06/04/2007), HIP PAIN, LEFT (01/30/2007), HYPERTENSION (11/28/2006), OBESITY NOS (09/27/2006), OSTEOARTHRITIS (11/28/2006), OSTEOARTHROSIS, GENERALIZED, UNSPC SITE (09/27/2006), PHLEBITIS&THROMBOPHLEBITIS LOWER EXTREM UNSPEC (03/07/2007), STATE, SYMPTOMATIC MENOPAUSE/FEM CLIMACTERIC (09/27/2006), and VENOUS INSUFFICIENCY, CHRONIC, RIGHT LEG (09/06/2009).  H/o Gout -mains on allopurinol 100 mg daily.  She has not had any recent gout flares  Essential hypertension-well-controlled with current regimen of Norvasc 2.5 mg and losartan/hydrochlorothiazide 100-12.5 mg.  She is supplemented with K.Dur.  She denies chest pain, shortness of breath, dizziness, lightheadedness, headaches, fatigue, or syncopal episode.  BP Readings from Last 3 Encounters:  10/02/18 140/70  01/01/18 120/62  07/29/17 138/70   H/o Glaucoma -is followed by [facility].  She reports no changes in vision since her last exam.  Osteoarthritis -reports that this is mostly in her hips and knees.  She feels as though the pain is getting worse especially in the hips.  She does use Advil on occasion but does not feel as though this helps  OAB/Urinary Incontinence -this is been a chronic issue for many years now but she feels as though it is slowly been getting worse.  She often cannot make it to the bathroom in time to urinate and at night is having to get up for 5 times at night to use the restroom.  She knows that she has to go out and do errands she has to wear a pad due to the incontinence. She denies UTI like symptoms   All immunizations and health maintenance protocols were reviewed with the patient  and needed orders were placed.  He is due for a Tdap, unfortunately insurance will not pay for it.  Appropriate screening laboratory values were ordered for the patient including screening of hyperlipidemia, renal function and hepatic function.  Medication reconciliation,  past medical history, social history, problem list and allergies were reviewed in detail with the patient  Goals were established with regard to weight loss, exercise, and  diet in compliance with medications Wt Readings from Last 3 Encounters:  10/02/18 215 lb (97.5 kg)  01/01/18 202 lb 12.8 oz (92 kg)  07/29/17 203 lb (92.1 kg)   End of life planning was discussed.  She is due for DEXA scan but refuses.  She does not participate in routine dental exams.   Review of Systems  Constitutional: Negative.   HENT: Negative.   Eyes: Negative.   Respiratory: Negative.   Cardiovascular: Negative.   Gastrointestinal: Negative.   Endocrine: Negative.   Genitourinary: Positive for frequency and urgency.  Musculoskeletal: Positive for arthralgias, back pain and gait problem.  Skin: Negative.   Allergic/Immunologic: Negative.   Hematological: Negative.   Psychiatric/Behavioral: Negative.    Past Medical History:  Diagnosis Date  . COLONIC POLYPS, HX OF 11/28/2006  . DVT 03/10/2007  . Edema 02/21/2007  . ESOPHAGITIS NOS 09/27/2006  . GOUT 06/04/2007  . HIP PAIN, LEFT 01/30/2007  . HYPERTENSION 11/28/2006  . OBESITY NOS 09/27/2006  . OSTEOARTHRITIS 11/28/2006  . OSTEOARTHROSIS, GENERALIZED, UNSPC SITE 09/27/2006  . PHLEBITIS&THROMBOPHLEBITIS LOWER EXTREM UNSPEC 03/07/2007  . STATE, SYMPTOMATIC MENOPAUSE/FEM CLIMACTERIC 09/27/2006  . VENOUS INSUFFICIENCY, CHRONIC, RIGHT LEG 09/06/2009    Social History  Socioeconomic History  . Marital status: Married    Spouse name: Not on file  . Number of children: Not on file  . Years of education: Not on file  . Highest education level: Not on file  Occupational History  . Not on file   Social Needs  . Financial resource strain: Not on file  . Food insecurity    Worry: Not on file    Inability: Not on file  . Transportation needs    Medical: Not on file    Non-medical: Not on file  Tobacco Use  . Smoking status: Never Smoker  . Smokeless tobacco: Never Used  Substance and Sexual Activity  . Alcohol use: No  . Drug use: No  . Sexual activity: Not on file  Lifestyle  . Physical activity    Days per week: Not on file    Minutes per session: Not on file  . Stress: Not on file  Relationships  . Social Herbalist on phone: Not on file    Gets together: Not on file    Attends religious service: Not on file    Active member of club or organization: Not on file    Attends meetings of clubs or organizations: Not on file    Relationship status: Not on file  . Intimate partner violence    Fear of current or ex partner: Not on file    Emotionally abused: Not on file    Physically abused: Not on file    Forced sexual activity: Not on file  Other Topics Concern  . Not on file  Social History Narrative  . Not on file    Past Surgical History:  Procedure Laterality Date  . ABDOMINAL HYSTERECTOMY      History reviewed. No pertinent family history.  Allergies  Allergen Reactions  . Ciprofloxacin     Made her feel like she was [place],nausea  . Macrobid [Nitrofurantoin Macrocrystal]     Made her feel like she was [place],nausea  . Sulfacetamide Sodium Itching and Rash  . Sulfamethoxazole-Trimethoprim Itching and Rash    Current Outpatient Medications on File Prior to Visit  Medication Sig Dispense Refill  . allopurinol (ZYLOPRIM) 100 MG tablet TAKE 1 TABLET(100 MG) BY MOUTH DAILY 90 tablet 3  . amLODipine (NORVASC) 2.5 MG tablet TAKE 1 TABLET(2.5 MG) BY MOUTH DAILY 90 tablet 3  . latanoprost (XALATAN) 0.005 % ophthalmic solution INSTILL 1 DROP IN BOTH EYES AT BEDTIME 10 mL 1  . losartan-hydrochlorothiazide (HYZAAR) 100-12.5 MG tablet  TAKE 1 TABLET BY MOUTH DAILY 90 tablet 3  . potassium chloride SA (K-DUR,KLOR-CON) 20 MEQ tablet TAKE 1 TABLET(20 MEQ) BY MOUTH DAILY 90 tablet 3   No current facility-administered medications on file prior to visit.     BP 140/70   Temp 98 F (36.7 C)   Ht 5' 5.75" (1.67 m)   Wt 215 lb (97.5 kg)   BMI 34.97 kg/m       Objective:   Physical Exam Vitals signs and nursing note reviewed.  Constitutional:      General: She is not in acute distress.    Appearance: Normal appearance. She is well-developed and normal weight. She is not ill-appearing, toxic-appearing or diaphoretic.  HENT:     Head: Normocephalic and atraumatic.     Right Ear: Tympanic membrane, ear canal and external ear normal. There is no impacted cerumen.     Left Ear: Tympanic membrane, ear canal  and external ear normal. There is no impacted cerumen.     Nose: Nose normal. No congestion or rhinorrhea.     Mouth/Throat:     Mouth: Mucous membranes are moist.     Pharynx: Oropharynx is clear. No oropharyngeal exudate or posterior oropharyngeal erythema.  Eyes:     General: No scleral icterus.       Right eye: No discharge.        Left eye: No discharge.     Extraocular Movements: Extraocular movements intact.     Conjunctiva/sclera: Conjunctivae normal.     Pupils: Pupils are equal, round, and reactive to light.  Neck:     Musculoskeletal: Normal range of motion and neck supple.     Thyroid: No thyromegaly.     Trachea: No tracheal deviation.  Cardiovascular:     Rate and Rhythm: Normal rate and regular rhythm.     Pulses: Normal pulses.     Heart sounds: Normal heart sounds. No murmur. No friction rub. No gallop.   Pulmonary:     Effort: Pulmonary effort is normal. No respiratory distress.     Breath sounds: Normal breath sounds. No stridor. No wheezing, rhonchi or rales.  Chest:     Chest wall: No tenderness.  Abdominal:     General: Abdomen is flat. Bowel sounds are normal. There is no distension.      Palpations: Abdomen is soft. There is no mass.     Tenderness: There is no abdominal tenderness. There is no right CVA tenderness, left CVA tenderness, guarding or rebound.     Hernia: No hernia is present.  Musculoskeletal: Normal range of motion.        General: Tenderness (Tenderness to bilateral knees and hips with palpation and range of motion as her sizes) present.  Lymphadenopathy:     Cervical: No cervical adenopathy.  Skin:    General: Skin is warm and dry.     Capillary Refill: Capillary refill takes less than 2 seconds.     Coloration: Skin is not jaundiced or pale.     Findings: No bruising, erythema, lesion or rash.  Neurological:     General: No focal deficit present.     Mental Status: She is alert and oriented to person, place, and time. Mental status is at baseline.     Cranial Nerves: No cranial nerve deficit.     Sensory: No sensory deficit.     Motor: No weakness.     Coordination: Coordination normal.     Gait: Gait normal.     Deep Tendon Reflexes: Reflexes normal.  Psychiatric:        Mood and Affect: Mood normal.        Behavior: Behavior normal.        Thought Content: Thought content normal.        Judgment: Judgment normal.       Assessment & Plan:  1. Routine general medical examination at a health care facility -Encouraged heart healthy diet and exercise.  Follow-up in 1 year or sooner if needed - CBC with Differential/Platelet - Comprehensive metabolic panel - Lipid panel - TSH  2. Essential hypertension -Well-controlled no change in medication therapy - CBC with Differential/Platelet - Comprehensive metabolic panel - Lipid panel - TSH  3. Idiopathic gout, unspecified chronicity, unspecified site -Continue with allopurinol  4. OAB (overactive bladder) -We will trial her on Myrbetriq 25 mg tablets.  She was advised to follow-up in 6 weeks if needed for increase  in medication - mirabegron ER (MYRBETRIQ) 25 MG TB24 tablet; Take 1 tablet (25  mg total) by mouth daily.  Dispense: 30 tablet; Refill: 0  5. Primary osteoarthritis involving multiple joints -We will trial her with Mobic 7.5 mg tablets.  She was advised not to take any other anti-inflammatories while taking this medication - meloxicam (MOBIC) 7.5 MG tablet; Take 1 tablet (7.5 mg total) by mouth daily.  Dispense: 30 tablet; Refill: 0  Dorothyann Peng, NP

## 2018-10-02 NOTE — Telephone Encounter (Signed)
Denied.  Trial of medication.

## 2018-10-03 ENCOUNTER — Ambulatory Visit: Payer: PPO

## 2018-10-23 ENCOUNTER — Other Ambulatory Visit: Payer: Self-pay | Admitting: Adult Health

## 2018-10-23 DIAGNOSIS — N3281 Overactive bladder: Secondary | ICD-10-CM

## 2018-10-23 DIAGNOSIS — M159 Polyosteoarthritis, unspecified: Secondary | ICD-10-CM

## 2018-10-24 NOTE — Telephone Encounter (Signed)
Sent to the pharmacy by e-scribe. 

## 2018-11-17 ENCOUNTER — Ambulatory Visit: Payer: PPO

## 2018-11-22 ENCOUNTER — Ambulatory Visit (INDEPENDENT_AMBULATORY_CARE_PROVIDER_SITE_OTHER): Payer: PPO

## 2018-11-22 ENCOUNTER — Other Ambulatory Visit: Payer: Self-pay

## 2018-11-22 DIAGNOSIS — Z23 Encounter for immunization: Secondary | ICD-10-CM

## 2019-01-26 DIAGNOSIS — H43813 Vitreous degeneration, bilateral: Secondary | ICD-10-CM | POA: Diagnosis not present

## 2019-01-26 DIAGNOSIS — H401131 Primary open-angle glaucoma, bilateral, mild stage: Secondary | ICD-10-CM | POA: Diagnosis not present

## 2019-01-29 ENCOUNTER — Other Ambulatory Visit: Payer: Self-pay | Admitting: Family Medicine

## 2019-01-29 DIAGNOSIS — I10 Essential (primary) hypertension: Secondary | ICD-10-CM

## 2019-01-29 DIAGNOSIS — M1000 Idiopathic gout, unspecified site: Secondary | ICD-10-CM

## 2019-01-29 MED ORDER — AMLODIPINE BESYLATE 2.5 MG PO TABS: ORAL_TABLET | ORAL | 2 refills | Status: DC

## 2019-01-29 MED ORDER — POTASSIUM CHLORIDE CRYS ER 20 MEQ PO TBCR: EXTENDED_RELEASE_TABLET | ORAL | 2 refills | Status: DC

## 2019-01-29 MED ORDER — ALLOPURINOL 100 MG PO TABS: ORAL_TABLET | ORAL | 2 refills | Status: DC

## 2019-01-29 NOTE — Telephone Encounter (Signed)
Sent to the pharmacy by e-scribe. 

## 2019-01-29 NOTE — Addendum Note (Signed)
Addended by: Miles Costain T on: 01/29/2019 02:10 PM   Modules accepted: Orders

## 2019-03-02 ENCOUNTER — Other Ambulatory Visit: Payer: Self-pay | Admitting: Adult Health

## 2019-03-02 DIAGNOSIS — I10 Essential (primary) hypertension: Secondary | ICD-10-CM

## 2019-03-28 ENCOUNTER — Ambulatory Visit: Payer: PPO

## 2019-04-01 ENCOUNTER — Ambulatory Visit: Payer: PPO

## 2019-04-03 ENCOUNTER — Ambulatory Visit: Payer: PPO | Attending: Internal Medicine

## 2019-04-03 DIAGNOSIS — Z23 Encounter for immunization: Secondary | ICD-10-CM | POA: Insufficient documentation

## 2019-04-03 NOTE — Progress Notes (Signed)
   Covid-19 Vaccination Clinic  Name:  Kristina Allison    MRN: [id number] DOB: Apr 17, 1935  04/03/2019  [name] was observed post Covid-19 immunization for 15 minutes without incidence. She was provided with Vaccine Information Sheet and instruction to access the V-Safe system.   [name] was instructed to call 911 with any severe reactions post vaccine: Marland Kitchen Difficulty breathing  . Swelling of your face and throat  . A fast heartbeat  . A bad rash all over your body  . Dizziness and weakness    Immunizations Administered    Name Date Dose VIS Date Route   Pfizer COVID-19 Vaccine 04/03/2019  9:51 AM 0.3 mL 02/06/2019 Intramuscular   Manufacturer: [place]   Lot: [id number]   [place]: [id number]

## 2019-04-06 ENCOUNTER — Ambulatory Visit (INDEPENDENT_AMBULATORY_CARE_PROVIDER_SITE_OTHER): Payer: PPO

## 2019-04-06 VITALS — Ht 66.0 in | Wt 208.0 lb

## 2019-04-06 DIAGNOSIS — Z0000 Encounter for general adult medical examination without abnormal findings: Secondary | ICD-10-CM | POA: Diagnosis not present

## 2019-04-06 NOTE — Progress Notes (Signed)
This visit is being conducted via phone call due to the COVID-19 pandemic. This patient has given me verbal consent via phone to conduct this visit, patient states they are participating from their home address. Some vital signs may be absent or patient reported.   Patient identification: identified by name, DOB, and current address.  Location provider: [facility], Office Persons participating in the virtual visit: Kristina. [name] and Franne Forts, LPN.    Subjective:   Kristina Allison is a 84 y.o. female who presents for Medicare Annual (Subsequent) preventive examination.  Kristina Allison is doing well at this time. She is staying active inside her home as well as outdoors when weather permits. She does not have a regular exercise routine. She reports eating 3 healthy, balanced meals per day and drinks plenty of water every day.  Review of Systems:  No ROS: Annual Medicare Wellness Visit Cardiac Risk Factors include: advanced age (>92men, >65 women);hypertension     Objective:     Vitals: Ht 5\' 6"  (1.676 m)   Wt 208 lb (94.3 kg)   BMI 33.57 kg/m   Body mass index is 33.57 kg/m.  Advanced Directives 04/06/2019  Does Patient Have a Medical Advance Directive? Yes  Type of Paramedic of [place];Living will  Does patient want to make changes to medical advance directive? No - Patient declined  Copy of [place] in Chart? No - copy requested    Tobacco Social History   Tobacco Use  Smoking Status Never Smoker  Smokeless Tobacco Never Used     Counseling given: Not Answered   Clinical Intake:  Pre-visit preparation completed: Yes  Pain : No/denies pain     BMI - recorded: 33.57 Nutritional Status: BMI > 30  Obese Nutritional Risks: Other (Comment) Diabetes: No  How often do you need to have someone help you when you read instructions, pamphlets, or other written materials from your doctor or pharmacy?: 1 - Never What is the  last grade level you completed in school?: 12th grade  Interpreter Needed?: No  Information entered by :: Franne Forts, LPN.  Past Medical History:  Diagnosis Date  . COLONIC POLYPS, HX OF 11/28/2006  . DVT 03/10/2007  . Edema 02/21/2007  . ESOPHAGITIS NOS 09/27/2006  . GOUT 06/04/2007  . HIP PAIN, LEFT 01/30/2007  . HYPERTENSION 11/28/2006  . OBESITY NOS 09/27/2006  . OSTEOARTHRITIS 11/28/2006  . OSTEOARTHROSIS, GENERALIZED, UNSPC SITE 09/27/2006  . PHLEBITIS&THROMBOPHLEBITIS LOWER EXTREM UNSPEC 03/07/2007  . STATE, SYMPTOMATIC MENOPAUSE/FEM CLIMACTERIC 09/27/2006  . VENOUS INSUFFICIENCY, CHRONIC, RIGHT LEG 09/06/2009   Past Surgical History:  Procedure Laterality Date  . ABDOMINAL HYSTERECTOMY     History reviewed. No pertinent family history. Social History   Socioeconomic History  . Marital status: Married    Spouse name: Not on file  . Number of children: 7  . Years of education: 92  . Highest education level: High school graduate  Occupational History  . Occupation: retired  Tobacco Use  . Smoking status: Never Smoker  . Smokeless tobacco: Never Used  Substance and Sexual Activity  . Alcohol use: No  . Drug use: No  . Sexual activity: Not on file  Other Topics Concern  . Not on file  Social History Narrative   Widowed   [place] : son lives with her   Likes word searches, reading, television   Social Determinants of Health   Financial Resource Strain: Low Risk   . Difficulty of  Paying Living Expenses: Not very hard  Food Insecurity: No Food Insecurity  . Worried About Charity fundraiser in the Last Year: Never true  . Ran Out of Food in the Last Year: Never true  Transportation Needs: No Transportation Needs  . Lack of Transportation (Medical): No  . Lack of Transportation (Non-Medical): No  Physical Activity: Inactive  . Days of Exercise per Week: 0 days  . Minutes of Exercise per Session: 0 min  Stress: No Stress Concern Present  . Feeling of Stress : Only a  little  Social Connections: Slightly Isolated  . Frequency of Communication with Friends and Family: More than three times a week  . Frequency of Social Gatherings with Friends and Family: Three times a week  . Attends Religious Services: More than 4 times per year  . Active Member of Clubs or Organizations: Yes  . Attends Archivist Meetings: More than 4 times per year  . Marital Status: Widowed    Outpatient Encounter Medications as of 04/06/2019  Medication Sig  . allopurinol (ZYLOPRIM) 100 MG tablet TAKE 1 TABLET(100 MG) BY MOUTH DAILY  . amLODipine (NORVASC) 2.5 MG tablet TAKE 1 TABLET(2.5 MG) BY MOUTH DAILY  . latanoprost (XALATAN) 0.005 % ophthalmic solution INSTILL 1 DROP IN BOTH EYES AT BEDTIME  . losartan-hydrochlorothiazide (HYZAAR) 100-12.5 MG tablet TAKE 1 TABLET BY MOUTH DAILY  . meloxicam (MOBIC) 7.5 MG tablet TAKE 1 TABLET(7.5 MG) BY MOUTH DAILY  . MYRBETRIQ 25 MG TB24 tablet TAKE 1 TABLET(25 MG) BY MOUTH DAILY  . potassium chloride SA (KLOR-CON) 20 MEQ tablet TAKE 1 TABLET(20 MEQ) BY MOUTH DAILY   No facility-administered encounter medications on file as of 04/06/2019.    Activities of Daily Living In your present state of health, do you have any difficulty performing the following activities: 04/06/2019  Hearing? N  Vision? N  Difficulty concentrating or making decisions? N  Walking or climbing stairs? N  Dressing or bathing? N  Doing errands, shopping? N  Preparing Food and eating ? N  Using the Toilet? N  In the past six months, have you accidently leaked urine? N  Do you have problems with loss of bowel control? N  Managing your Medications? N  Managing your Finances? N  Housekeeping or managing your Housekeeping? N  Some recent data might be hidden    Patient Care Team: Dorothyann Peng, NP as PCP - General (Family Medicine)    Assessment:   This is a routine wellness examination for Kristina Allison.  Exercise Activities and Dietary recommendations Current  Exercise Habits: The patient does not participate in regular exercise at present, Exercise limited by: None identified  Goals    . Exercise 3x per week (30 min per time)     Try to start walking outside on days when weather is cooperative as we discussed       Fall Risk Fall Risk  04/06/2019 10/02/2018 07/27/2015 05/31/2014 02/02/2013  Falls in the past year? 0 0 No No No  Risk for fall due to : Medication side effect - - - -  Follow up Falls evaluation completed;Education provided;Falls prevention discussed - - - -   Is the patient's home free of loose throw rugs in walkways, pet beds, electrical cords, etc?   yes      Grab bars in the bathroom? yes      Handrails on the stairs?   yes      Adequate lighting?   yes  Timed Get  Up and Go performed: N/A due to telephone visit  Depression Screen PHQ 2/9 Scores 04/06/2019 10/02/2018 07/27/2015 05/31/2014  PHQ - 2 Score 0 0 0 0     Cognitive Function     6CIT Screen 04/06/2019  What Year? 0 points  What month? 0 points  What time? 0 points  Count back from 20 0 points  Months in reverse 0 points  Repeat phrase 0 points  Total Score 0    Immunization History  Administered Date(s) Administered  . Fluad Quad(high Dose 65+) 11/22/2018  . Influenza Split 11/29/2011  . Influenza Whole 11/18/2007, 12/05/2009, 11/07/2010  . Influenza, High Dose Seasonal PF 12/10/2012, 12/23/2014, 12/14/2015, 12/04/2016, 11/29/2017  . Influenza,inj,Quad PF,6+ Mos 12/07/2013  . PFIZER SARS-COV-2 Vaccination 04/03/2019  . Pneumococcal Conjugate-13 02/02/2013  . Pneumococcal Polysaccharide-23 07/29/2008  . Td 07/29/2008    Qualifies for Shingles Vaccine?Yes  Screening Tests Health Maintenance  Topic Date Due  . DEXA SCAN  03/06/2002  . TETANUS/TDAP  07/30/2018  . MAMMOGRAM  09/18/2019  . INFLUENZA VACCINE  Completed  . PNA vac Low Risk Adult  Completed    Cancer Screenings: Lung: Low Dose CT Chest recommended if Age 35-80 years, 30 pack-year currently  smoking OR have quit w/in 15years. Patient does not qualify. Breast:  Up to date on Mammogram? Yes   Up to date of Bone Density/Dexa? No; declines to do at this time Colorectal: yes; aged out  Additional Screenings:  Hepatitis C Screening: N/A due to age.     Plan:   Kristina Allison was encouraged to walk outside when weather permits and to walk inside her home on other days. Continue a diet containing fresh fruits, fresh vegetables, and lean proteins. Continue 6 glasses of water every day. She will check on out of pocket costs for shingrix and complete if that's possible for her. She will consider an updated Tdap and understands the cost is not covered by Medicare.    I have personally reviewed and noted the following in the patient's chart:   . Medical and social history . Use of alcohol, tobacco or illicit drugs  . Current medications and supplements . Functional ability and status . Nutritional status . Physical activity . Advanced directives . List of other physicians . Hospitalizations, surgeries, and ER visits in previous 12 months . Vitals . Screenings to include cognitive, depression, and falls . Referrals and appointments  In addition, I have reviewed and discussed with patient certain preventive protocols, quality metrics, and best practice recommendations. A written personalized care plan for preventive services as well as general preventive health recommendations were provided to patient.     Franne Forts, LPN  [id number]

## 2019-04-06 NOTE — Patient Instructions (Addendum)
Kristina Allison , Thank you for taking time to participate in your Medicare Wellness Visit. I appreciate your ongoing commitment to your health goals. Please review the following plan we discussed and let me know if I can assist you in the future.   Screening recommendations/referrals: Colorectal Screening: colonoscopy completed 03/01/2008; no longer necessary due to advanced age. Mammogram: completed 09/26/2018; due again 09/27/2019.  Bone Density: patient reports that she had one in past but declines to do at this time.  Vision and Dental Exams: Recommended annual ophthalmology exams for early detection of glaucoma and other disorders of the eye. Patient states she saw eye provider in Dec 2020 for routine eye exam. Recommended annual dental exams for proper oral hygiene. Patient states she last saw dentist >1 year ago.   Diabetic Exams: Diabetic Eye Exam: N/A Diabetic Foot Exam: N/A  Vaccinations: Influenza vaccine: completed 11/22/2018; due again in Fall 2021. Pneumococcal vaccine: completed 02/02/2013 & 07/29/2008. Up to date.  Tdap vaccine: completed 07/29/2008. Past due 07/30/2018. This is not covered by Medicare unless you have an accident like a puncture wound or laceration.  Shingles vaccine: Please call your insurance company or pharmacy to determine your out of pocket expense for the Shingrix vaccine. You may receive this vaccine at your local pharmacy. This is  A series of two injections to be given 2-6 months apart.   Advanced directives: Advance directives discussed with you today. Please bring a copy of your POA (Power of [place]) and/or Living Will to your next appointment.  Goals: Continue to drink at least 6-8 8oz glasses of water per day.  Recommend to exercise for at least 150 minutes per week.  Recommend to remove any items from the home that may cause slips or trips.  Recommend to decrease portion sizes by eating 3 small healthy meals and at least 2 healthy snacks per  day.  Next appointment: Please schedule your Annual Wellness Visit with your Nurse Health Advisor in one year.  Preventive Care 11 Years and Older, Female Preventive care refers to lifestyle choices and visits with your health care provider that can promote health and wellness. What does preventive care include?  A yearly physical exam. This is also called an annual well check.  Dental exams once or twice a year.  Routine eye exams. Ask your health care provider how often you should have your eyes checked.  Personal lifestyle choices, including:  Daily care of your teeth and gums.  Regular physical activity.  Eating a healthy diet.  Avoiding tobacco and drug use.  Limiting alcohol use.  Practicing safe sex.  Taking low-dose aspirin every day if recommended by your health care provider.  Taking vitamin and mineral supplements as recommended by your health care provider. What happens during an annual well check? The services and screenings done by your health care provider during your annual well check will depend on your age, overall health, lifestyle risk factors, and family history of disease. Counseling  Your health care provider may ask you questions about your:  Alcohol use.  Tobacco use.  Drug use.  Emotional well-being.  Home and relationship well-being.  Sexual activity.  Eating habits.  History of falls.  Memory and ability to understand (cognition).  Work and work Statistician.  Reproductive health. Screening  You may have the following tests or measurements:  Height, weight, and BMI.  Blood pressure.  Lipid and cholesterol levels. These may be checked every 5 years, or more frequently if you are over 50  years old.  Skin check.  Lung cancer screening. You may have this screening every year starting at age 52 if you have a 30-pack-year history of smoking and currently smoke or have quit within the past 15 years.  Fecal occult blood test  (FOBT) of the stool. You may have this test every year starting at age 19.  Flexible sigmoidoscopy or colonoscopy. You may have a sigmoidoscopy every 5 years or a colonoscopy every 10 years starting at age 38.  Hepatitis C blood test.  Hepatitis B blood test.  Sexually transmitted disease (STD) testing.  Diabetes screening. This is done by checking your blood sugar (glucose) after you have not eaten for a while (fasting). You may have this done every 1-3 years.  Bone density scan. This is done to screen for osteoporosis. You may have this done starting at age 12.  Mammogram. This may be done every 1-2 years. Talk to your health care provider about how often you should have regular mammograms. Talk with your health care provider about your test results, treatment options, and if necessary, the need for more tests. Vaccines  Your health care provider may recommend certain vaccines, such as:  Influenza vaccine. This is recommended every year.  Tetanus, diphtheria, and acellular pertussis (Tdap, Td) vaccine. You may need a Td booster every 10 years.  Zoster vaccine. You may need this after age 36.  Pneumococcal 13-valent conjugate (PCV13) vaccine. One dose is recommended after age 66.  Pneumococcal polysaccharide (PPSV23) vaccine. One dose is recommended after age 10. Talk to your health care provider about which screenings and vaccines you need and how often you need them. This information is not intended to replace advice given to you by your health care provider. Make sure you discuss any questions you have with your health care provider. Document Released: 03/11/2015 Document Revised: 11/02/2015 Document Reviewed: 12/14/2014 Elsevier Interactive Patient Education  2017 [place] Prevention in the Home Falls can cause injuries. They can happen to people of all ages. There are many things you can do to make your home safe and to help prevent falls. What can I do on the  outside of my home?  Regularly fix the edges of walkways and driveways and fix any cracks.  Remove anything that might make you trip as you walk through a door, such as a raised step or threshold.  Trim any bushes or trees on the path to your home.  Use bright outdoor lighting.  Clear any walking paths of anything that might make someone trip, such as rocks or tools.  Regularly check to see if handrails are loose or broken. Make sure that both sides of any steps have handrails.  Any raised decks and porches should have guardrails on the edges.  Have any leaves, snow, or ice cleared regularly.  Use sand or salt on walking paths during winter.  Clean up any spills in your garage right away. This includes oil or grease spills. What can I do in the bathroom?  Use night lights.  Install grab bars by the toilet and in the tub and shower. Do not use towel bars as grab bars.  Use non-skid mats or decals in the tub or shower.  If you need to sit down in the shower, use a plastic, non-slip stool.  Keep the floor dry. Clean up any water that spills on the floor as soon as it happens.  Remove soap buildup in the tub or shower regularly.  Attach  bath mats securely with double-sided non-slip rug tape.  Do not have throw rugs and other things on the floor that can make you trip. What can I do in the bedroom?  Use night lights.  Make sure that you have a light by your bed that is easy to reach.  Do not use any sheets or blankets that are too big for your bed. They should not hang down onto the floor.  Have a firm chair that has side arms. You can use this for support while you get dressed.  Do not have throw rugs and other things on the floor that can make you trip. What can I do in the kitchen?  Clean up any spills right away.  Avoid walking on wet floors.  Keep items that you use a lot in easy-to-reach places.  If you need to reach something above you, use a strong step  stool that has a grab bar.  Keep electrical cords out of the way.  Do not use floor polish or wax that makes floors slippery. If you must use wax, use non-skid floor wax.  Do not have throw rugs and other things on the floor that can make you trip. What can I do with my stairs?  Do not leave any items on the stairs.  Make sure that there are handrails on both sides of the stairs and use them. Fix handrails that are broken or loose. Make sure that handrails are as long as the stairways.  Check any carpeting to make sure that it is firmly attached to the stairs. Fix any carpet that is loose or worn.  Avoid having throw rugs at the top or bottom of the stairs. If you do have throw rugs, attach them to the floor with carpet tape.  Make sure that you have a light switch at the top of the stairs and the bottom of the stairs. If you do not have them, ask someone to add them for you. What else can I do to help prevent falls?  Wear shoes that:  Do not have high heels.  Have rubber bottoms.  Are comfortable and fit you well.  Are closed at the toe. Do not wear sandals.  If you use a stepladder:  Make sure that it is fully opened. Do not climb a closed stepladder.  Make sure that both sides of the stepladder are locked into place.  Ask someone to hold it for you, if possible.  Clearly mark and make sure that you can see:  Any grab bars or handrails.  First and last steps.  Where the edge of each step is.  Use tools that help you move around (mobility aids) if they are needed. These include:  Canes.  Walkers.  Scooters.  Crutches.  Turn on the lights when you go into a dark area. Replace any light bulbs as soon as they burn out.  Set up your furniture so you have a clear path. Avoid moving your furniture around.  If any of your floors are uneven, fix them.  If there are any pets around you, be aware of where they are.  Review your medicines with your doctor. Some  medicines can make you feel dizzy. This can increase your chance of falling. Ask your doctor what other things that you can do to help prevent falls. This information is not intended to replace advice given to you by your health care provider. Make sure you discuss any questions you have with  your health care provider. Document Released: 12/09/2008 Document Revised: 07/21/2015 Document Reviewed: 03/19/2014 Elsevier Interactive Patient Education  2017 [employer].

## 2019-04-28 ENCOUNTER — Ambulatory Visit: Payer: PPO | Attending: Internal Medicine

## 2019-04-28 DIAGNOSIS — Z23 Encounter for immunization: Secondary | ICD-10-CM | POA: Insufficient documentation

## 2019-04-28 NOTE — Progress Notes (Signed)
   Covid-[facility] Vaccination [facility]  Name:  Kristina Allison    MRN: [id number] DOB: Aug 20, 1935  04/28/2019  Kristina Allison was observed post Covid-19 immunization for 15 minutes without incident. She was provided with Vaccine Information Sheet and instruction to access the V-Safe system.   Kristina Allison was instructed to call 911 with any severe reactions post vaccine: Marland Kitchen Difficulty breathing  . Swelling of face and throat  . A fast heartbeat  . A bad rash all over body  . Dizziness and weakness   Immunizations Administered    Name Date Dose VIS Date Route   Pfizer COVID-19 Vaccine 04/28/2019 10:44 AM 0.3 mL 02/06/2019 Intramuscular   Manufacturer: [place]   Lot: [id number]   [place]: [id number]

## 2019-07-28 DIAGNOSIS — H401131 Primary open-angle glaucoma, bilateral, mild stage: Secondary | ICD-10-CM | POA: Diagnosis not present

## 2019-10-03 DIAGNOSIS — Z1231 Encounter for screening mammogram for malignant neoplasm of breast: Secondary | ICD-10-CM | POA: Diagnosis not present

## 2019-10-03 LAB — HM MAMMOGRAPHY

## 2019-10-06 ENCOUNTER — Encounter: Payer: Self-pay | Admitting: Adult Health

## 2019-10-21 ENCOUNTER — Other Ambulatory Visit: Payer: Self-pay | Admitting: Adult Health

## 2019-10-21 DIAGNOSIS — M159 Polyosteoarthritis, unspecified: Secondary | ICD-10-CM

## 2019-10-21 DIAGNOSIS — M1000 Idiopathic gout, unspecified site: Secondary | ICD-10-CM

## 2019-10-21 DIAGNOSIS — N3281 Overactive bladder: Secondary | ICD-10-CM

## 2019-10-21 DIAGNOSIS — I10 Essential (primary) hypertension: Secondary | ICD-10-CM

## 2019-10-22 ENCOUNTER — Other Ambulatory Visit: Payer: Self-pay

## 2019-10-22 NOTE — Telephone Encounter (Signed)
Patient need to schedule an ov for more refills. 

## 2019-10-23 ENCOUNTER — Encounter: Payer: Self-pay | Admitting: Adult Health

## 2019-10-23 ENCOUNTER — Ambulatory Visit (INDEPENDENT_AMBULATORY_CARE_PROVIDER_SITE_OTHER): Payer: PPO | Admitting: Adult Health

## 2019-10-23 VITALS — BP 118/60 | HR 77 | Temp 98.3°F | Ht 66.0 in | Wt 215.0 lb

## 2019-10-23 DIAGNOSIS — I10 Essential (primary) hypertension: Secondary | ICD-10-CM

## 2019-10-23 DIAGNOSIS — N3281 Overactive bladder: Secondary | ICD-10-CM

## 2019-10-23 DIAGNOSIS — Z0000 Encounter for general adult medical examination without abnormal findings: Secondary | ICD-10-CM

## 2019-10-23 DIAGNOSIS — M8949 Other hypertrophic osteoarthropathy, multiple sites: Secondary | ICD-10-CM

## 2019-10-23 DIAGNOSIS — M159 Polyosteoarthritis, unspecified: Secondary | ICD-10-CM

## 2019-10-23 DIAGNOSIS — M1000 Idiopathic gout, unspecified site: Secondary | ICD-10-CM

## 2019-10-23 MED ORDER — MIRABEGRON ER 50 MG PO TB24: 50.0000 mg | ORAL_TABLET | Freq: Every day | ORAL | 11 refills | Status: DC

## 2019-10-23 MED ORDER — POTASSIUM CHLORIDE CRYS ER 20 MEQ PO TBCR: EXTENDED_RELEASE_TABLET | ORAL | 3 refills | Status: DC

## 2019-10-23 MED ORDER — MELOXICAM 7.5 MG PO TABS: ORAL_TABLET | ORAL | 11 refills | Status: DC

## 2019-10-23 MED ORDER — ALLOPURINOL 100 MG PO TABS: ORAL_TABLET | ORAL | 3 refills | Status: DC

## 2019-10-23 MED ORDER — AMLODIPINE BESYLATE 2.5 MG PO TABS: ORAL_TABLET | ORAL | 3 refills | Status: DC

## 2019-10-23 NOTE — Patient Instructions (Signed)
Health Maintenance Due  Topic Date Due  . DEXA SCAN  Never done  . TETANUS/TDAP  07/30/2018  . INFLUENZA VACCINE  09/27/2019    Depression screen [facility] 2/9 04/06/2019 10/02/2018 07/27/2015  Decreased Interest 0 0 0  Down, Depressed, Hopeless 0 0 0  PHQ - 2 Score 0 0 0

## 2019-10-23 NOTE — Progress Notes (Signed)
Subjective:    Patient ID: Kristina Allison, female    DOB: 1935/04/07, 84 y.o.   MRN: [id number]  HPI Patient presents for yearly preventative medicine examination. She is a pleasant 84 year old female who  has a past medical history of COLONIC POLYPS, HX OF (11/28/2006), DVT (03/10/2007), Edema (02/21/2007), ESOPHAGITIS NOS (09/27/2006), GOUT (06/04/2007), HIP PAIN, LEFT (01/30/2007), HYPERTENSION (11/28/2006), OBESITY NOS (09/27/2006), OSTEOARTHRITIS (11/28/2006), OSTEOARTHROSIS, GENERALIZED, UNSPC SITE (09/27/2006), PHLEBITIS&THROMBOPHLEBITIS LOWER EXTREM UNSPEC (03/07/2007), STATE, SYMPTOMATIC MENOPAUSE/FEM CLIMACTERIC (09/27/2006), and VENOUS INSUFFICIENCY, CHRONIC, RIGHT LEG (09/06/2009).  Gout -maintained on allopurinol 100 mg daily.  She denies any recent gout flares  Essential hypertension-is well controlled with current and Norvasc 2.5 mg and losartan/hydrochlorothiazide 100-12.5 mg.  She is supplemented with potassium.  She denies chest pain, shortness of breath, dizziness, lightheadedness, headaches, fatigue, or syncopal episodes.  Glaucoma -followed by [facility].  She reports no changes in vision or pressures since last exam in June 2021   Osteoarthritis-is mostly in her hips and knees. Currently prescribed Mobic 7.5 mg daily.   OAB -was started on Myrbetriq 25 mg during her CPE last year.  She reports that since starting this medication that her symptoms have vastly improved.but continues to have episodes of incontinence.   All immunizations and health maintenance protocols were reviewed with the patient and needed orders were placed. UTD on vaccinations   Appropriate screening laboratory values were ordered for the patient including screening of hyperlipidemia, renal function and hepatic function.  Medication reconciliation,  past medical history, social history, problem list and allergies were reviewed in detail with the patient  Goals were established with regard to weight loss, exercise,  and  diet in compliance with medications  She is due for DEXA scan but refuses to have this done.  Review of Systems  Constitutional: Negative.   HENT: Negative.   Eyes: Negative.   Respiratory: Negative.   Cardiovascular: Negative.   Gastrointestinal: Negative.   Endocrine: Negative.   Genitourinary: Negative.   Musculoskeletal: Positive for arthralgias and back pain. Negative for gait problem and myalgias.  Skin: Negative.   Allergic/Immunologic: Negative.   Neurological: Negative.   Hematological: Negative.   Psychiatric/Behavioral: Negative.    Past Medical History:  Diagnosis Date   COLONIC POLYPS, HX OF 11/28/2006   DVT 03/10/2007   Edema 02/21/2007   ESOPHAGITIS NOS 09/27/2006   GOUT 06/04/2007   HIP PAIN, LEFT 01/30/2007   HYPERTENSION 11/28/2006   OBESITY NOS 09/27/2006   OSTEOARTHRITIS 11/28/2006   OSTEOARTHROSIS, GENERALIZED, UNSPC SITE 09/27/2006   PHLEBITIS&THROMBOPHLEBITIS LOWER EXTREM UNSPEC 03/07/2007   STATE, SYMPTOMATIC MENOPAUSE/FEM CLIMACTERIC 09/27/2006   VENOUS INSUFFICIENCY, CHRONIC, RIGHT LEG 09/06/2009    Social History   Socioeconomic History   Marital status: Married    Spouse name: Not on file   Number of children: 7   Years of education: 12   Highest education level: High school graduate  Occupational History   Occupation: retired  Tobacco Use   Smoking status: Never Smoker   Smokeless tobacco: Never Used  Substance and Sexual Activity   Alcohol use: No   Drug use: No   Sexual activity: Not on file  Other Topics Concern   Not on file  Social History Narrative   Widowed   [place] : son lives with her   Likes word searches, reading, television   Social Determinants of Health   Financial Resource Strain: Low Risk    Difficulty of Paying Living Expenses: Not very hard  Food Insecurity: No Food Insecurity   Worried About Charity fundraiser in the Last Year: Never true   Ran Out of Food in the Last Year: Never true    Transportation Needs: No Transportation Needs   Lack of Transportation (Medical): No   Lack of Transportation (Non-Medical): No  Physical Activity: Inactive   Days of Exercise per Week: 0 days   Minutes of Exercise per Session: 0 min  Stress: No Stress Concern Present   Feeling of Stress : Only a little  Social Connections: Moderately Integrated   Frequency of Communication with Friends and Family: More than three times a week   Frequency of Social Gatherings with Friends and Family: Three times a week   Attends Religious Services: More than 4 times per year   Active Member of Clubs or Organizations: Yes   Attends Archivist Meetings: More than 4 times per year   Marital Status: Widowed  Human resources officer Violence:    Fear of Current or Ex-Partner: Not on file   Emotionally Abused: Not on file   Physically Abused: Not on file   Sexually Abused: Not on file    Past Surgical History:  Procedure Laterality Date   ABDOMINAL HYSTERECTOMY      No family history on file.  Allergies  Allergen Reactions   Ciprofloxacin     Made her feel like she was in [place],nausea   Macrobid [Nitrofurantoin Macrocrystal]     Made her feel like she was [place],nausea   Sulfacetamide Sodium Itching and Rash   Sulfamethoxazole-Trimethoprim Itching and Rash    Current Outpatient Medications on File Prior to Visit  Medication Sig Dispense Refill   allopurinol (ZYLOPRIM) 100 MG tablet TAKE 1 TABLET(100 MG) BY MOUTH DAILY 90 tablet 2   amLODipine (NORVASC) 2.5 MG tablet TAKE 1 TABLET(2.5 MG) BY MOUTH DAILY 90 tablet 2   latanoprost (XALATAN) 0.005 % ophthalmic solution INSTILL 1 DROP IN BOTH EYES AT BEDTIME 10 mL 1   losartan-hydrochlorothiazide (HYZAAR) 100-12.5 MG tablet TAKE 1 TABLET BY MOUTH DAILY 90 tablet 3   meloxicam (MOBIC) 7.5 MG tablet TAKE 1 TABLET(7.5 MG) BY MOUTH DAILY 90 tablet 3   MYRBETRIQ 25 MG TB24 tablet TAKE 1 TABLET(25 MG) BY MOUTH  DAILY 90 tablet 3   potassium chloride SA (KLOR-CON) 20 MEQ tablet TAKE 1 TABLET(20 MEQ) BY MOUTH DAILY 90 tablet 2   No current facility-administered medications on file prior to visit.    There were no vitals taken for this visit.      Objective:   Physical Exam Vitals and nursing note reviewed.  Constitutional:      General: She is not in acute distress.    Appearance: Normal appearance. She is well-developed. She is obese. She is not ill-appearing.  HENT:     Head: Normocephalic and atraumatic.     Right Ear: Tympanic membrane, ear canal and external ear normal. There is no impacted cerumen.     Left Ear: Tympanic membrane, ear canal and external ear normal. There is no impacted cerumen.     Nose: Nose normal. No congestion or rhinorrhea.     Mouth/Throat:     Mouth: Mucous membranes are moist.     Pharynx: Oropharynx is clear. No oropharyngeal exudate or posterior oropharyngeal erythema.  Eyes:     General:        Right eye: No discharge.        Left eye: No discharge.  Extraocular Movements: Extraocular movements intact.     Conjunctiva/sclera: Conjunctivae normal.     Pupils: Pupils are equal, round, and reactive to light.  Neck:     Thyroid: No thyromegaly.     Vascular: No carotid bruit.     Trachea: No tracheal deviation.  Cardiovascular:     Rate and Rhythm: Normal rate and regular rhythm.     Pulses: Normal pulses.     Heart sounds: Normal heart sounds. No murmur heard.  No friction rub. No gallop.   Pulmonary:     Effort: Pulmonary effort is normal. No respiratory distress.     Breath sounds: Normal breath sounds. No stridor. No wheezing, rhonchi or rales.  Chest:     Chest wall: No tenderness.  Abdominal:     General: Abdomen is flat. Bowel sounds are normal. There is no distension.     Palpations: Abdomen is soft. There is no mass.     Tenderness: There is no abdominal tenderness. There is no right CVA tenderness, left CVA tenderness, guarding or  rebound.     Hernia: No hernia is present.  Musculoskeletal:        General: No swelling, tenderness, deformity or signs of injury. Normal range of motion.     Cervical back: Normal range of motion and neck supple.     Right lower leg: No edema.     Left lower leg: No edema.  Lymphadenopathy:     Cervical: No cervical adenopathy.  Skin:    General: Skin is warm and dry.     Coloration: Skin is not jaundiced or pale.     Findings: No bruising, erythema, lesion or rash.  Neurological:     General: No focal deficit present.     Mental Status: She is alert and oriented to person, place, and time.     Cranial Nerves: No cranial nerve deficit.     Sensory: No sensory deficit.     Motor: No weakness.     Coordination: Coordination normal.     Gait: Gait normal.     Deep Tendon Reflexes: Reflexes normal.  Psychiatric:        Mood and Affect: Mood normal.        Behavior: Behavior normal.        Thought Content: Thought content normal.        Judgment: Judgment normal.       Assessment & Plan:  1. Routine general medical examination at a health care facility - Encouraged heart healthy diet and exercise to help lose weight  - Follow up in one year or sooner if needed - CBC with Differential/Platelet; Future - Lipid panel; Future - TSH; Future - CMP with eGFR([facility]); Future - CMP with eGFR([facility]) - TSH - Lipid panel - CBC with Differential/Platelet  2. Essential hypertension - No change in medication  - Well controlled.  - CBC with Differential/Platelet; Future - Lipid panel; Future - TSH; Future - CMP with eGFR([facility]); Future - amLODipine (NORVASC) 2.5 MG tablet; TAKE 1 TABLET(2.5 MG) BY MOUTH DAILY  Dispense: 90 tablet; Refill: 3 - potassium chloride SA (KLOR-CON) 20 MEQ tablet; TAKE 1 TABLET(20 MEQ) BY MOUTH DAILY  Dispense: 90 tablet; Refill: 3 - CMP with eGFR([facility]) - TSH - Lipid panel - CBC with Differential/Platelet  3. Idiopathic gout, unspecified chronicity,  unspecified site  - allopurinol (ZYLOPRIM) 100 MG tablet; TAKE 1 TABLET(100 MG) BY MOUTH DAILY  Dispense: 90 tablet; Refill: 3  4. OAB (overactive bladder) -  Will increase Myrbetriq to 50 mg daily for better control. - mirabegron ER (MYRBETRIQ) 50 MG TB24 tablet; Take 1 tablet (50 mg total) by mouth daily.  Dispense: 30 tablet; Refill: 11  5. Primary osteoarthritis involving multiple joints  - meloxicam (MOBIC) 7.5 MG tablet; TAKE 1 TABLET(7.5 MG) BY MOUTH DAILY  Dispense: 30 tablet; Refill: [place], NP

## 2019-10-24 LAB — CBC WITH DIFFERENTIAL/PLATELET
Absolute Monocytes: 653 cells/uL (ref 200–950)
Basophils Absolute: 139 cells/uL (ref 0–200)
Basophils Relative: 1.3 %
Eosinophils Absolute: 524 cells/uL — ABNORMAL HIGH (ref 15–500)
Eosinophils Relative: 4.9 %
HCT: 43.5 % (ref 35.0–45.0)
Hemoglobin: 14.3 g/dL (ref 11.7–15.5)
Lymphs Abs: 3028 cells/uL (ref 850–3900)
MCH: 29.6 pg (ref 27.0–33.0)
MCHC: 32.9 g/dL (ref 32.0–36.0)
MCV: 90.1 fL (ref 80.0–100.0)
MPV: 10.3 fL (ref 7.5–12.5)
Monocytes Relative: 6.1 %
Neutro Abs: 6356 cells/uL (ref 1500–7800)
Neutrophils Relative %: 59.4 %
Platelets: 354 10*3/uL (ref 140–400)
RBC: 4.83 10*6/uL (ref 3.80–5.10)
RDW: 12.9 % (ref 11.0–15.0)
Total Lymphocyte: 28.3 %
WBC: 10.7 10*3/uL (ref 3.8–10.8)

## 2019-10-24 LAB — LIPID PANEL
Cholesterol: 169 mg/dL (ref <200)
HDL: 52 mg/dL (ref >=50)
LDL Cholesterol (Calc): 98 mg/dL (calc)
Non-HDL Cholesterol (Calc): 117 mg/dL (calc) (ref <130)
Total CHOL/HDL Ratio: 3.3 (calc) (ref <5.0)
Triglycerides: 98 mg/dL (ref <150)

## 2019-10-24 LAB — COMPLETE METABOLIC PANEL WITH GFR
AG Ratio: 1.4 (calc) (ref 1.0–2.5)
ALT: 16 U/L (ref 6–29)
AST: 18 U/L (ref 10–35)
Albumin: 4.1 g/dL (ref 3.6–5.1)
Alkaline phosphatase (APISO): 82 U/L (ref 37–153)
BUN: 19 mg/dL (ref 7–25)
CO2: 27 mmol/L (ref 20–32)
Calcium: 9.6 mg/dL (ref 8.6–10.4)
Chloride: 102 mmol/L (ref 98–110)
Creat: 0.74 mg/dL (ref 0.60–0.88)
GFR, Est African American: 87 mL/min/{1.73_m2} (ref >=60)
GFR, Est Non African American: 75 mL/min/{1.73_m2} (ref >=60)
Globulin: 3 g/dL (calc) (ref 1.9–3.7)
Glucose, Bld: 107 mg/dL — ABNORMAL HIGH (ref 65–99)
Potassium: 4.3 mmol/L (ref 3.5–5.3)
Sodium: 139 mmol/L (ref 135–146)
Total Bilirubin: 1.3 mg/dL — ABNORMAL HIGH (ref 0.2–1.2)
Total Protein: 7.1 g/dL (ref 6.1–8.1)

## 2019-10-24 LAB — TSH: TSH: 0.86 mIU/L (ref 0.40–4.50)

## 2019-11-16 ENCOUNTER — Ambulatory Visit: Payer: Self-pay

## 2019-11-16 ENCOUNTER — Ambulatory Visit (INDEPENDENT_AMBULATORY_CARE_PROVIDER_SITE_OTHER): Payer: PPO | Admitting: Orthopaedic Surgery

## 2019-11-16 VITALS — Ht 66.0 in | Wt 221.0 lb

## 2019-11-16 DIAGNOSIS — M1712 Unilateral primary osteoarthritis, left knee: Secondary | ICD-10-CM | POA: Insufficient documentation

## 2019-11-16 DIAGNOSIS — M1711 Unilateral primary osteoarthritis, right knee: Secondary | ICD-10-CM

## 2019-11-16 DIAGNOSIS — M25562 Pain in left knee: Secondary | ICD-10-CM | POA: Diagnosis not present

## 2019-11-16 MED ORDER — METHYLPREDNISOLONE ACETATE 40 MG/ML IJ SUSP
40.0000 mg | INTRAMUSCULAR | Status: AC | PRN
  Administered 2019-11-16: 40 mg via INTRA_ARTICULAR

## 2019-11-16 MED ORDER — LIDOCAINE HCL 1 % IJ SOLN
3.0000 mL | INTRAMUSCULAR | Status: AC | PRN
  Administered 2019-11-16: 3 mL

## 2019-11-16 NOTE — Progress Notes (Signed)
Office Visit Note   Patient: Kristina Allison           Date of Birth: 04-19-1935           MRN: [id number] Visit Date: 11/16/2019              Requested by: Dorothyann Peng, NP [address],  [place] [address] PCP: Dorothyann Peng, NP   Assessment & Plan: Visit Diagnoses:  1. Left knee pain, unspecified chronicity   2. Unilateral primary osteoarthritis, left knee   3. Unilateral primary osteoarthritis, right knee     Plan: I went over the patient's x-rays with her.  She understands she does have significant arthritis in the left knee.  She would like to try a steroid injection today.  I did talk about quad strengthening exercises and activity modification as well.  She did tolerate the steroid injection.  My neck step would be to consider hyaluronic acid for the left knee if she has problems down the road.  All question concerns were answered and addressed.  She will call us if things worsen.  Follow-up is as needed.  Follow-Up Instructions: Return if symptoms worsen or fail to improve.   Orders:  Orders Placed This Encounter  Procedures  . Large Joint Inj  . XR Knee 1-2 Views Left   No orders of the defined types were placed in this encounter.     Procedures: Large Joint Inj: L knee on 11/16/2019 8:52 AM Indications: diagnostic evaluation and pain Details: 22 G 1.5 in needle, superolateral approach  Arthrogram: No  Medications: 3 mL lidocaine 1 %; 40 mg methylPREDNISolone acetate 40 MG/ML Outcome: tolerated well, no immediate complications Procedure, treatment alternatives, risks and benefits explained, specific risks discussed. Consent was given by the patient. Immediately prior to procedure a time out was called to verify the correct patient, procedure, equipment, support staff and site/side marked as required. Patient was prepped and draped in the usual sterile fashion.       Clinical Data: No additional findings.   Subjective: Chief Complaint    Patient presents with  . Left Knee - Pain  The patient comes in for evaluation treatment of left knee pain is been hurting for several weeks now.  She has no known injury but says by the end of the day the knee and leg are swollen.  It hurts really in the back of the knee.  She says her knee is stiff when she first gets up in the morning but she is able to walk it off.  She is 84 years old and does take care of a son at home who is mentally challenged.  She has had no other acute change in medical status and denies any injury.  She points mainly to the medial weightbearing side of her knee and posteriorly the source of her pain.  She denies any locking catching or giving way symptoms.  HPI  Review of Systems She currently denies any headache, chest pain, shortness of breath, fever, chills, nausea, vomiting  Objective: Vital Signs: Ht 5\' 6"  (1.676 m)   Wt 221 lb (100.2 kg)   BMI 35.67 kg/m   Physical Exam She is alert and oriented x3 and in no acute distress Ortho Exam Examination of her left knee shows some varus malalignment.  I felt that there was a mild effusion but I was able to only aspirate a few cc of fluid from the knee.  She has good range of  motion of the knee and is ligamentously stable.  There is patellofemoral crepitation. Specialty Comments:  No specialty comments available.  Imaging: XR Knee 1-2 Views Left  Result Date: 11/16/2019 2 views of the left knee show tricompartmental arthritic changes with varus malalignment, medial joint space narrowing and tricompartmental osteophytes.    PMFS History: Patient Active Problem List   Diagnosis Date Noted  . Unilateral primary osteoarthritis, left knee 11/16/2019  . Unilateral primary osteoarthritis, right knee 11/16/2019  . Impingement syndrome of right shoulder 03/13/2018  . Venous (peripheral) insufficiency 09/06/2009  . GOUT 06/04/2007  . DVT 03/10/2007  . PHLEBITIS&THROMBOPHLEBITIS LOWER EXTREM UNSPEC 03/07/2007   . Essential hypertension 11/28/2006  . Osteoarthritis 11/28/2006  . History of colonic polyps 11/28/2006  . OBESITY NOS 09/27/2006  . ESOPHAGITIS NOS 09/27/2006  . STATE, SYMPTOMATIC MENOPAUSE/FEM CLIMACTERIC 09/27/2006   Past Medical History:  Diagnosis Date  . COLONIC POLYPS, HX OF 11/28/2006  . DVT 03/10/2007  . Edema 02/21/2007  . ESOPHAGITIS NOS 09/27/2006  . GOUT 06/04/2007  . HIP PAIN, LEFT 01/30/2007  . HYPERTENSION 11/28/2006  . OBESITY NOS 09/27/2006  . OSTEOARTHRITIS 11/28/2006  . OSTEOARTHROSIS, GENERALIZED, UNSPC SITE 09/27/2006  . PHLEBITIS&THROMBOPHLEBITIS LOWER EXTREM UNSPEC 03/07/2007  . STATE, SYMPTOMATIC MENOPAUSE/FEM CLIMACTERIC 09/27/2006  . VENOUS INSUFFICIENCY, CHRONIC, RIGHT LEG 09/06/2009    No family history on file.  Past Surgical History:  Procedure Laterality Date  . ABDOMINAL HYSTERECTOMY     Social History   Occupational History  . Occupation: retired  Tobacco Use  . Smoking status: Never Smoker  . Smokeless tobacco: Never Used  Substance and Sexual Activity  . Alcohol use: No  . Drug use: No  . Sexual activity: Not on file

## 2019-11-17 ENCOUNTER — Ambulatory Visit: Payer: PPO | Admitting: Orthopaedic Surgery

## 2019-11-25 DIAGNOSIS — Z23 Encounter for immunization: Secondary | ICD-10-CM | POA: Diagnosis not present

## 2020-02-02 DIAGNOSIS — H401131 Primary open-angle glaucoma, bilateral, mild stage: Secondary | ICD-10-CM | POA: Diagnosis not present

## 2020-02-26 ENCOUNTER — Other Ambulatory Visit: Payer: Self-pay | Admitting: Adult Health

## 2020-02-26 DIAGNOSIS — I10 Essential (primary) hypertension: Secondary | ICD-10-CM

## 2020-02-29 ENCOUNTER — Other Ambulatory Visit: Payer: Self-pay | Admitting: Adult Health

## 2020-02-29 DIAGNOSIS — I10 Essential (primary) hypertension: Secondary | ICD-10-CM

## 2020-04-26 ENCOUNTER — Ambulatory Visit (INDEPENDENT_AMBULATORY_CARE_PROVIDER_SITE_OTHER): Payer: PPO

## 2020-04-26 DIAGNOSIS — Z780 Asymptomatic menopausal state: Secondary | ICD-10-CM

## 2020-04-26 DIAGNOSIS — Z0000 Encounter for general adult medical examination without abnormal findings: Secondary | ICD-10-CM

## 2020-04-26 NOTE — Patient Instructions (Signed)
Kristina Allison , Thank you for taking time to come for your Medicare Wellness Visit. I appreciate your ongoing commitment to your health goals. Please review the following plan we discussed and let me know if I can assist you in the future.   Screening recommendations/referrals: Colonoscopy: No longer required  Mammogram: Up to date, next due 10/02/2020 Bone Density: Currently due, orders placed this visit  Recommended yearly ophthalmology/optometry visit for glaucoma screening and checkup Recommended yearly dental visit for hygiene and checkup  Vaccinations: Influenza vaccine: Up to date, next due fall 2022  Pneumococcal vaccine: Completed series  Tdap vaccine: Currently due, you may await and injury to receive as it will be covered by medicare at that time or you may receive at your local pharmacy.  Shingles vaccine: Currently due Shingrix, if you would like to receive we recommend that you do so at your local pharmacy as it is less expensive to do so     Advanced directives: Please bring in copies of your advanced medical directives so that we may scan them into your chart.   Conditions/risks identified: Please try to exercise at least 30 minutes a day 3x per week  Next appointment: 10/25/2020 @ 9:00 am with Dorothyann Peng, NP    Preventive Care 28 Years and Older, Female Preventive care refers to lifestyle choices and visits with your health care provider that can promote health and wellness. What does preventive care include?  A yearly physical exam. This is also called an annual well check.  Dental exams once or twice a year.  Routine eye exams. Ask your health care provider how often you should have your eyes checked.  Personal lifestyle choices, including:  Daily care of your teeth and gums.  Regular physical activity.  Eating a healthy diet.  Avoiding tobacco and drug use.  Limiting alcohol use.  Practicing safe sex.  Taking low-dose aspirin every day.  Taking  vitamin and mineral supplements as recommended by your health care provider. What happens during an annual well check? The services and screenings done by your health care provider during your annual well check will depend on your age, overall health, lifestyle risk factors, and family history of disease. Counseling  Your health care provider may ask you questions about your:  Alcohol use.  Tobacco use.  Drug use.  Emotional well-being.  Home and relationship well-being.  Sexual activity.  Eating habits.  History of falls.  Memory and ability to understand (cognition).  Work and work Statistician.  Reproductive health. Screening  You may have the following tests or measurements:  Height, weight, and BMI.  Blood pressure.  Lipid and cholesterol levels. These may be checked every 5 years, or more frequently if you are over 14 years old.  Skin check.  Lung cancer screening. You may have this screening every year starting at age 10 if you have a 30-pack-year history of smoking and currently smoke or have quit within the past 15 years.  Fecal occult blood test (FOBT) of the stool. You may have this test every year starting at age 71.  Flexible sigmoidoscopy or colonoscopy. You may have a sigmoidoscopy every 5 years or a colonoscopy every 10 years starting at age 70.  Hepatitis C blood test.  Hepatitis B blood test.  Sexually transmitted disease (STD) testing.  Diabetes screening. This is done by checking your blood sugar (glucose) after you have not eaten for a while (fasting). You may have this done every 1-3 years.  Bone density  scan. This is done to screen for osteoporosis. You may have this done starting at age 19.  Mammogram. This may be done every 1-2 years. Talk to your health care provider about how often you should have regular mammograms. Talk with your health care provider about your test results, treatment options, and if necessary, the need for more  tests. Vaccines  Your health care provider may recommend certain vaccines, such as:  Influenza vaccine. This is recommended every year.  Tetanus, diphtheria, and acellular pertussis (Tdap, Td) vaccine. You may need a Td booster every 10 years.  Zoster vaccine. You may need this after age 71.  Pneumococcal 13-valent conjugate (PCV13) vaccine. One dose is recommended after age 89.  Pneumococcal polysaccharide (PPSV23) vaccine. One dose is recommended after age 51. Talk to your health care provider about which screenings and vaccines you need and how often you need them. This information is not intended to replace advice given to you by your health care provider. Make sure you discuss any questions you have with your health care provider. Document Released: 03/11/2015 Document Revised: 11/02/2015 Document Reviewed: 12/14/2014 Elsevier Interactive Patient Education  2017 [place] Prevention in the Home Falls can cause injuries. They can happen to people of all ages. There are many things you can do to make your home safe and to help prevent falls. What can I do on the outside of my home?  Regularly fix the edges of walkways and driveways and fix any cracks.  Remove anything that might make you trip as you walk through a door, such as a raised step or threshold.  Trim any bushes or trees on the path to your home.  Use bright outdoor lighting.  Clear any walking paths of anything that might make someone trip, such as rocks or tools.  Regularly check to see if handrails are loose or broken. Make sure that both sides of any steps have handrails.  Any raised decks and porches should have guardrails on the edges.  Have any leaves, snow, or ice cleared regularly.  Use sand or salt on walking paths during winter.  Clean up any spills in your garage right away. This includes oil or grease spills. What can I do in the bathroom?  Use night lights.  Install grab bars by the  toilet and in the tub and shower. Do not use towel bars as grab bars.  Use non-skid mats or decals in the tub or shower.  If you need to sit down in the shower, use a plastic, non-slip stool.  Keep the floor dry. Clean up any water that spills on the floor as soon as it happens.  Remove soap buildup in the tub or shower regularly.  Attach bath mats securely with double-sided non-slip rug tape.  Do not have throw rugs and other things on the floor that can make you trip. What can I do in the bedroom?  Use night lights.  Make sure that you have a light by your bed that is easy to reach.  Do not use any sheets or blankets that are too big for your bed. They should not hang down onto the floor.  Have a firm chair that has side arms. You can use this for support while you get dressed.  Do not have throw rugs and other things on the floor that can make you trip. What can I do in the kitchen?  Clean up any spills right away.  Avoid walking on wet  floors.  Keep items that you use a lot in easy-to-reach places.  If you need to reach something above you, use a strong step stool that has a grab bar.  Keep electrical cords out of the way.  Do not use floor polish or wax that makes floors slippery. If you must use wax, use non-skid floor wax.  Do not have throw rugs and other things on the floor that can make you trip. What can I do with my stairs?  Do not leave any items on the stairs.  Make sure that there are handrails on both sides of the stairs and use them. Fix handrails that are broken or loose. Make sure that handrails are as long as the stairways.  Check any carpeting to make sure that it is firmly attached to the stairs. Fix any carpet that is loose or worn.  Avoid having throw rugs at the top or bottom of the stairs. If you do have throw rugs, attach them to the floor with carpet tape.  Make sure that you have a light switch at the top of the stairs and the bottom of  the stairs. If you do not have them, ask someone to add them for you. What else can I do to help prevent falls?  Wear shoes that:  Do not have high heels.  Have rubber bottoms.  Are comfortable and fit you well.  Are closed at the toe. Do not wear sandals.  If you use a stepladder:  Make sure that it is fully opened. Do not climb a closed stepladder.  Make sure that both sides of the stepladder are locked into place.  Ask someone to hold it for you, if possible.  Clearly mark and make sure that you can see:  Any grab bars or handrails.  First and last steps.  Where the edge of each step is.  Use tools that help you move around (mobility aids) if they are needed. These include:  Canes.  Walkers.  Scooters.  Crutches.  Turn on the lights when you go into a dark area. Replace any light bulbs as soon as they burn out.  Set up your furniture so you have a clear path. Avoid moving your furniture around.  If any of your floors are uneven, fix them.  If there are any pets around you, be aware of where they are.  Review your medicines with your doctor. Some medicines can make you feel dizzy. This can increase your chance of falling. Ask your doctor what other things that you can do to help prevent falls. This information is not intended to replace advice given to you by your health care provider. Make sure you discuss any questions you have with your health care provider. Document Released: 12/09/2008 Document Revised: 07/21/2015 Document Reviewed: 03/19/2014 Elsevier Interactive Patient Education  2017 [employer].

## 2020-04-26 NOTE — Progress Notes (Signed)
Subjective:   Kristina Allison is a 85 y.o. female who presents for Medicare Annual (Subsequent) preventive examination.  I connected with Kristina Allison  today by telephone and verified that I am speaking with the correct person using two identifiers. Location patient: home Location provider: work Persons participating in the virtual visit: patient, provider.   I discussed the limitations, risks, security and privacy concerns of performing an evaluation and management service by telephone and the availability of in person appointments. I also discussed with the patient that there may be a patient responsible charge related to this service. The patient expressed understanding and verbally consented to this telephonic visit.    Interactive audio and video telecommunications were attempted between this provider and patient, however failed, due to patient having technical difficulties OR patient did not have access to video capability.  We continued and completed visit with audio only.      Review of Systems    N/A  Cardiac Risk Factors include: advanced age (>35men, >38 women);hypertension;obesity (BMI >30kg/m2)     Objective:    Today's Vitals   There is no height or weight on file to calculate BMI.  Advanced Directives 04/26/2020 04/06/2019  Does Patient Have a Medical Advance Directive? Yes Yes  Type of Paramedic of [place];Living will [place];Living will  Does patient want to make changes to medical advance directive? No - Patient declined No - Patient declined  Copy of [place] in Chart? No - copy requested No - copy requested    Current Medications (verified) Outpatient Encounter Medications as of 04/26/2020  Medication Sig  . allopurinol (ZYLOPRIM) 100 MG tablet TAKE 1 TABLET(100 MG) BY MOUTH DAILY  . amLODipine (NORVASC) 2.5 MG tablet TAKE 1 TABLET(2.5 MG) BY MOUTH DAILY  . latanoprost (XALATAN) 0.005 %  ophthalmic solution INSTILL 1 DROP IN BOTH EYES AT BEDTIME  . losartan-hydrochlorothiazide (HYZAAR) 100-12.5 MG tablet TAKE 1 TABLET BY MOUTH DAILY  . meloxicam (MOBIC) 7.5 MG tablet TAKE 1 TABLET(7.5 MG) BY MOUTH DAILY  . mirabegron ER (MYRBETRIQ) 50 MG TB24 tablet Take 1 tablet (50 mg total) by mouth daily.  . potassium chloride SA (KLOR-CON) 20 MEQ tablet TAKE 1 TABLET(20 MEQ) BY MOUTH DAILY  . [DISCONTINUED] losartan-hydrochlorothiazide (HYZAAR) 100-12.5 MG tablet TAKE 1 TABLET BY MOUTH DAILY   No facility-administered encounter medications on file as of 04/26/2020.    Allergies (verified) Ciprofloxacin, Macrobid [nitrofurantoin macrocrystal], Sulfacetamide sodium, and Sulfamethoxazole-trimethoprim   History: Past Medical History:  Diagnosis Date  . COLONIC POLYPS, HX OF 11/28/2006  . DVT 03/10/2007  . Edema 02/21/2007  . ESOPHAGITIS NOS 09/27/2006  . GOUT 06/04/2007  . HIP PAIN, LEFT 01/30/2007  . HYPERTENSION 11/28/2006  . OBESITY NOS 09/27/2006  . OSTEOARTHRITIS 11/28/2006  . OSTEOARTHROSIS, GENERALIZED, UNSPC SITE 09/27/2006  . PHLEBITIS&THROMBOPHLEBITIS LOWER EXTREM UNSPEC 03/07/2007  . STATE, SYMPTOMATIC MENOPAUSE/FEM CLIMACTERIC 09/27/2006  . VENOUS INSUFFICIENCY, CHRONIC, RIGHT LEG 09/06/2009   Past Surgical History:  Procedure Laterality Date  . ABDOMINAL HYSTERECTOMY     History reviewed. No pertinent family history. Social History   Socioeconomic History  . Marital status: Widowed    Spouse name: Not on file  . Number of children: 7  . Years of education: 1  . Highest education level: High school graduate  Occupational History  . Occupation: retired  Tobacco Use  . Smoking status: Never Smoker  . Smokeless tobacco: Never Used  Substance and Sexual Activity  . Alcohol use:  No  . Drug use: No  . Sexual activity: Not on file  Other Topics Concern  . Not on file  Social History Narrative   Widowed   [place] : son lives with her   Likes word searches, reading, television    Social Determinants of Health   Financial Resource Strain: Low Risk   . Difficulty of Paying Living Expenses: Not hard at all  Food Insecurity: No Food Insecurity  . Worried About Charity fundraiser in the Last Year: Never true  . Ran Out of Food in the Last Year: Never true  Transportation Needs: No Transportation Needs  . Lack of Transportation (Medical): No  . Lack of Transportation (Non-Medical): No  Physical Activity: Inactive  . Days of Exercise per Week: 0 days  . Minutes of Exercise per Session: 0 min  Stress: No Stress Concern Present  . Feeling of Stress : Not at all  Social Connections: Moderately Integrated  . Frequency of Communication with Friends and Family: More than three times a week  . Frequency of Social Gatherings with Friends and Family: More than three times a week  . Attends Religious Services: More than 4 times per year  . Active Member of Clubs or Organizations: Yes  . Attends Archivist Meetings: More than 4 times per year  . Marital Status: Widowed    Tobacco Counseling Counseling given: Not Answered   Clinical Intake:  Pre-visit preparation completed: Yes  Pain : No/denies pain     Nutritional Risks: None Diabetes: No  How often do you need to have someone help you when you read instructions, pamphlets, or other written materials from your doctor or pharmacy?: 1 - Never What is the last grade level you completed in school?: High School  Diabetic?No   Interpreter Needed?: No  Information entered by :: [place] of Daily Living In your present state of health, do you have any difficulty performing the following activities: 04/26/2020  Hearing? N  Vision? N  Difficulty concentrating or making decisions? N  Walking or climbing stairs? N  Dressing or bathing? N  Doing errands, shopping? N  Preparing Food and eating ? N  Using the Toilet? N  In the past six months, have you accidently leaked urine? Y   Comment has occassional bladder leakage with urgency  Do you have problems with loss of bowel control? N  Managing your Medications? N  Managing your Finances? N  Housekeeping or managing your Housekeeping? N  Some recent data might be hidden    Patient Care Team: Dorothyann Peng, NP as PCP - General (Family Medicine)  Indicate any recent Medical Services you may have received from other than [facility] providers in the past year (date may be approximate).     Assessment:   This is a routine wellness examination for [place].  Hearing/Vision screen  Hearing Screening   125Hz  250Hz  500Hz  1000Hz  2000Hz  3000Hz  4000Hz  6000Hz  8000Hz   Right ear:           Left ear:           Vision Screening Comments: Gets eyes examined once every 6 months due to glaucoma.   Dietary issues and exercise activities discussed: Current Exercise Habits: The patient does not participate in regular exercise at present  Goals    . Exercise 3x per week (30 min per time)     Try to start walking outside on days when weather is cooperative as we discussed    .  Patient Stated     I want to maintain my health.      Depression Screen PHQ 2/9 Scores 04/26/2020 04/06/2019 10/02/2018 07/27/2015 05/31/2014  PHQ - 2 Score 0 0 0 0 0    Fall Risk Fall Risk  04/26/2020 04/06/2019 10/02/2018 07/27/2015 05/31/2014  Falls in the past year? 0 0 0 No No  Number falls in past yr: 0 - - - -  Injury with Fall? 0 - - - -  Risk for fall due to : No Fall Risks Medication side effect - - -  Follow up Falls evaluation completed;Falls prevention discussed Falls evaluation completed;Education provided;Falls prevention discussed - - -    FALL RISK PREVENTION PERTAINING TO THE HOME:  Any stairs in or around the home? No  If so, are there any without handrails? No  Home free of loose throw rugs in walkways, pet beds, electrical cords, etc? Yes  Adequate lighting in your home to reduce risk of falls? Yes   ASSISTIVE DEVICES UTILIZED TO PREVENT  FALLS:  Life alert? No  Use of a cane, walker or w/c? No  Grab bars in the bathroom? Yes  Shower chair or bench in shower? Yes  Elevated toilet seat or a handicapped toilet? Yes     Cognitive Function:   Normal cognitive status assessed by direct observation by this Nurse Health Advisor. No abnormalities found.     6CIT Screen 04/06/2019  What Year? 0 points  What month? 0 points  What time? 0 points  Count back from 20 0 points  Months in reverse 0 points  Repeat phrase 0 points  Total Score 0    Immunizations Immunization History  Administered Date(s) Administered  . Fluad Quad(high Dose 65+) 11/22/2018  . Influenza Split 11/29/2011  . Influenza Whole 11/18/2007, 12/05/2009, 11/07/2010  . Influenza, High Dose Seasonal PF 12/10/2012, 12/23/2014, 12/14/2015, 12/04/2016, 11/29/2017  . Influenza,inj,Quad PF,6+ Mos 12/07/2013  . Influenza-Unspecified 11/25/2019  . PFIZER(Purple Top)SARS-COV-2 Vaccination 04/03/2019, 04/28/2019, 12/02/2019  . Pneumococcal Conjugate-13 02/02/2013  . Pneumococcal Polysaccharide-23 07/29/2008  . Td 07/29/2008    TDAP status: Due, Education has been provided regarding the importance of this vaccine. Advised may receive this vaccine at local pharmacy or Health Dept. Aware to provide a copy of the vaccination record if obtained from local pharmacy or Health Dept. Verbalized acceptance and understanding.  Flu Vaccine status: Up to date  Pneumococcal vaccine status: Up to date  Covid-19 vaccine status: Completed vaccines  Qualifies for Shingles Vaccine? Yes   Zostavax completed No   Shingrix Completed?: No.    Education has been provided regarding the importance of this vaccine. Patient has been advised to call insurance company to determine out of pocket expense if they have not yet received this vaccine. Advised may also receive vaccine at local pharmacy or Health Dept. Verbalized acceptance and understanding.  Screening Tests Health  Maintenance  Topic Date Due  . DEXA SCAN  Never done  . TETANUS/TDAP  07/30/2018  . MAMMOGRAM  10/02/2020  . INFLUENZA VACCINE  Completed  . COVID-19 Vaccine  Completed  . PNA vac Low Risk Adult  Completed  . HPV VACCINES  Aged Out    Health Maintenance  Health Maintenance Due  Topic Date Due  . DEXA SCAN  Never done  . TETANUS/TDAP  07/30/2018    Colorectal cancer screening: No longer required.   Mammogram status: Completed 10/03/2019. Repeat every year  Bone Density status: Ordered 04/26/2020. Pt provided with contact info and advised  to call to schedule appt.  Lung Cancer Screening: (Low Dose CT Chest recommended if Age 70-80 years, 30 pack-year currently smoking OR have quit w/in 15years.) does not qualify.   Lung Cancer Screening Referral: N/A   Additional Screening:  Hepatitis C Screening: does not qualify;   Vision Screening: Recommended annual ophthalmology exams for early detection of glaucoma and other disorders of the eye. Is the patient up to date with their annual eye exam?  Yes  Who is the provider or what is the name of the office in which the patient attends annual eye exams? Dr. Alanda Slim  If pt is not established with a provider, would they like to be referred to a provider to establish care? No .   Dental Screening: Recommended annual dental exams for proper oral hygiene  Community Resource Referral / Chronic Care Management: CRR required this visit?  No   CCM required this visit?  No      Plan:     I have personally reviewed and noted the following in the patient's chart:   . Medical and social history . Use of alcohol, tobacco or illicit drugs  . Current medications and supplements . Functional ability and status . Nutritional status . Physical activity . Advanced directives . List of other physicians . Hospitalizations, surgeries, and ER visits in previous 12 months . Vitals . Screenings to include cognitive, depression, and  falls . Referrals and appointments  In addition, I have reviewed and discussed with patient certain preventive protocols, quality metrics, and best practice recommendations. A written personalized care plan for preventive services as well as general preventive health recommendations were provided to patient.     Ofilia Neas, LPN   10/04/3808   Nurse Notes: None

## 2020-05-19 ENCOUNTER — Encounter: Payer: Self-pay | Admitting: Adult Health

## 2020-06-07 ENCOUNTER — Other Ambulatory Visit: Payer: Self-pay

## 2020-06-07 ENCOUNTER — Ambulatory Visit (INDEPENDENT_AMBULATORY_CARE_PROVIDER_SITE_OTHER)
Admission: RE | Admit: 2020-06-07 | Discharge: 2020-06-07 | Disposition: A | Discharge status: Home / Self Care | Payer: PPO | Source: Ambulatory Visit | Attending: Adult Health | Admitting: Adult Health

## 2020-06-07 DIAGNOSIS — Z780 Asymptomatic menopausal state: Secondary | ICD-10-CM

## 2020-07-28 ENCOUNTER — Telehealth: Payer: Self-pay | Admitting: Adult Health

## 2020-07-28 NOTE — Chronic Care Management (AMB) (Signed)
  Chronic Care Management   Note  07/28/2020 Name: Kristina Allison MRN: [id number] DOB: 15-Mar-1935  Kristina Allison is a 85 y.o. year old female who is a primary care patient of Dorothyann Peng, NP. I reached out to [name] by phone today in response to a referral sent by Kristina Allison PCP, Dorothyann Peng, NP.   Kristina Allison was given information about Chronic Care Management services today including:  1. CCM service includes personalized support from designated clinical staff supervised by her physician, including individualized plan of care and coordination with other care providers 2. 24/7 contact phone numbers for assistance for urgent and routine care needs. 3. Service will only be billed when office clinical staff spend 20 minutes or more in a month to coordinate care. 4. Only one practitioner may furnish and bill the service in a calendar month. 5. The patient may stop CCM services at any time (effective at the end of the month) by phone call to the office staff.   Patient agreed to services and verbal consent obtained.   Follow up plan:   Tatjana Secretary/administrator

## 2020-08-09 DIAGNOSIS — H401131 Primary open-angle glaucoma, bilateral, mild stage: Secondary | ICD-10-CM | POA: Diagnosis not present

## 2020-08-31 ENCOUNTER — Other Ambulatory Visit: Payer: Self-pay | Admitting: Adult Health

## 2020-08-31 DIAGNOSIS — I10 Essential (primary) hypertension: Secondary | ICD-10-CM

## 2020-08-31 NOTE — Telephone Encounter (Signed)
Pt needs to make an appt. For more refills

## 2020-09-01 ENCOUNTER — Other Ambulatory Visit: Payer: Self-pay | Admitting: Adult Health

## 2020-09-01 DIAGNOSIS — I10 Essential (primary) hypertension: Secondary | ICD-10-CM

## 2020-09-19 ENCOUNTER — Telehealth: Payer: Self-pay | Admitting: Pharmacist

## 2020-09-19 NOTE — Progress Notes (Signed)
    Chronic Care Management Pharmacy Assistant   Name: Kristina Allison  MRN: [id number] DOB: [birth year]   Reason for Encounter: Chart review for CPP visit on 09/21/2020   Conditions to be addressed/monitored: HTN and Gout and Overactive Bladder   Recent office visits:   04-26-2020 Launa Grill, LPN (PCP) - Patient preseted for AWV. No medication changes.   Recent consult visits: None  Hospital visits:  None in previous 6 months  Medications: Outpatient Encounter Medications as of 09/19/2020  Medication Sig   allopurinol (ZYLOPRIM) 100 MG tablet TAKE 1 TABLET(100 MG) BY MOUTH DAILY   amLODipine (NORVASC) 2.5 MG tablet TAKE 1 TABLET(2.5 MG) BY MOUTH DAILY   latanoprost (XALATAN) 0.005 % ophthalmic solution INSTILL 1 DROP IN BOTH EYES AT BEDTIME   losartan-hydrochlorothiazide (HYZAAR) 100-12.5 MG tablet TAKE 1 TABLET BY MOUTH DAILY   meloxicam (MOBIC) 7.5 MG tablet TAKE 1 TABLET(7.5 MG) BY MOUTH DAILY   mirabegron ER (MYRBETRIQ) 50 MG TB24 tablet Take 1 tablet (50 mg total) by mouth daily.   potassium chloride SA (KLOR-CON) 20 MEQ tablet TAKE 1 TABLET(20 MEQ) BY MOUTH DAILY   No facility-administered encounter medications on file as of 09/19/2020.   Have you seen any other providers since your last visit? ** Patient reports none.  Any changes in your medications or health? Patient reports none.  Any side effects from any medications? Patient reports her medications are working well for her as far as she can tell.  Do you have an symptoms or problems not managed by your medications? Patient reports none.  Any concerns about your health right now? Patient reports none.  Has your provider asked that you check blood pressure, blood sugar, or follow special diet at home? Patient reports she does not regularly check her pressures advises she is always within normal range with her medication. Patient reports she tries to watch her salt intake but finds it difficult, reports she eats  fast food more than she should. For breakfast has cold cereal and coffee, for lunch may eat out and dinner includes a vegetable and sometimes a meat.   Do you get any type of exercise on a regular basis? Patient reports when weather is permitting she gets out for yard work and gets around indoors and sometime walks.   Can you think of a goal you would like to reach for your health? Patient reports she would like to do better watching her diet and eating.  Do you have any problems getting your medications? Patient reports none.   Is there anything that you would like to discuss during the appointment? Patient reports no.  Patient aware to have medications and supplements available for appointment.  Care Gaps:  Zoster Vaccine - Overdue Tetanus - Overdue COVID Booster #4 - Overdue Therapist, music) AWV- Scheduled - 04/27/21  Star Rating Drugs:  Losartan HCTZ 100/12.'5mg'$   30DS - Last filled 09-01-2020 at [place] Pharmacist Assistant [phone]

## 2020-09-21 ENCOUNTER — Ambulatory Visit (INDEPENDENT_AMBULATORY_CARE_PROVIDER_SITE_OTHER): Payer: PPO | Admitting: Pharmacist

## 2020-09-21 DIAGNOSIS — I10 Essential (primary) hypertension: Secondary | ICD-10-CM

## 2020-09-21 DIAGNOSIS — N3281 Overactive bladder: Secondary | ICD-10-CM

## 2020-09-21 NOTE — Progress Notes (Signed)
Chronic Care Management Pharmacy Note  09/25/2020 Name:  Kristina Allison MRN:  [id number] DOB:  12/30/1935  Summary: BP is at goal < 140/90 per office readings but does not check at home   Recommendations/Changes made from today's visit: -Recommended supplementation with 600 mg of calcium citrate and 1000 units of vitamin D daily -Recommend repeat uric acid level as this is overdue to be checked -Recommended weekly BP monitoring at home  Plan: Follow up BP assessment in 2 months   Subjective: Kristina Allison is an 85 y.o. year old female who is a primary patient of Dorothyann Peng, NP.  The CCM team was consulted for assistance with disease management and care coordination needs.    Engaged with patient by telephone for initial visit in response to provider referral for pharmacy case management and/or care coordination services.   Consent to Services:  The patient was given the following information about Chronic Care Management services today, agreed to services, and gave verbal consent: 1. CCM service includes personalized support from designated clinical staff supervised by the primary care provider, including individualized plan of care and coordination with other care providers 2. 24/7 contact phone numbers for assistance for urgent and routine care needs. 3. Service will only be billed when office clinical staff spend 20 minutes or more in a month to coordinate care. 4. Only one practitioner may furnish and bill the service in a calendar month. 5.The patient may stop CCM services at any time (effective at the end of the month) by phone call to the office staff. 6. The patient will be responsible for cost sharing (co-pay) of up to 20% of the service fee (after annual deductible is met). Patient agreed to services and consent obtained.  Patient Care Team: Dorothyann Peng, NP as PCP - General (Family Medicine) Viona Gilmore, [facility] as Pharmacist (Pharmacist)  Recent office  visits: 04/26/20 Launa Grill, LPN - Patient presented for AWV. No medication changes.  10/23/19 Dorothyann Peng, NP: Patient presented for annual exam. Increased Myrbetriq to 50 mg daily.  Recent consult visits: None  Hospital visits: None in previous 6 months   Objective:  Lab Results  Component Value Date   CREATININE 0.74 10/23/2019   BUN 19 10/23/2019   GFR 84.35 10/02/2018   GFRNONAA 75 10/23/2019   GFRAA 87 10/23/2019   NA 139 10/23/2019   K 4.3 10/23/2019   CALCIUM 9.6 10/23/2019   CO2 27 10/23/2019   GLUCOSE 107 (H) 10/23/2019    Lab Results  Component Value Date/Time   GFR 84.35 10/02/2018 10:57 AM   GFR 104.13 07/29/2017 08:26 AM    Last diabetic Eye exam: No results found for: HMDIABEYEEXA  Last diabetic Foot exam: No results found for: HMDIABFOOTEX   Lab Results  Component Value Date   CHOL 169 10/23/2019   HDL 52 10/23/2019   LDLCALC 98 10/23/2019   TRIG 98 10/23/2019   CHOLHDL 3.3 10/23/2019    Hepatic Function Latest Ref Rng & Units 10/23/2019 10/02/2018 07/29/2017  Total Protein 6.1 - 8.1 g/dL 7.1 7.1 6.6  Albumin 3.5 - 5.2 g/dL - 4.0 3.9  AST 10 - 35 U/L _0 ALT 6 - 29 U/L _1 Alk Phosphatase 39 - 117 U/L - 74 76  Total Bilirubin 0.2 - 1.2 mg/dL 1.3(H) 0.8 1.0  Bilirubin, Direct 0.0 - 0.3 mg/dL - - -    Lab Results  Component Value Date/Time   TSH 0.86 10/23/2019  10:52 AM   TSH 1.21 10/02/2018 10:57 AM    CBC Latest Ref Rng & Units 10/23/2019 10/02/2018 07/29/2017  WBC 3.8 - 10.8 Thousand/uL 10.7 9.4 8.1  Hemoglobin 11.7 - 15.5 g/dL 14.3 14.2 13.7  Hematocrit 35.0 - 45.0 % 43.5 42.4 40.4  Platelets 140 - 400 Thousand/uL 354 326.0 325.0    No results found for: VD25OH  Clinical ASCVD: No  The ASCVD Risk score Mikey Bussing DC Jr., et al., 2013) failed to calculate for the following reasons:   The 2013 ASCVD risk score is only valid for ages 86 to 54    Depression screen PHQ 2/9 04/26/2020 04/06/2019 10/02/2018  Decreased Interest 0 0 0   Down, Depressed, Hopeless 0 0 0  PHQ - 2 Score 0 0 0     Social History   Tobacco Use  Smoking Status Never  Smokeless Tobacco Never   BP Readings from Last 3 Encounters:  10/23/19 118/60  10/02/18 140/70  01/01/18 120/62   Pulse Readings from Last 3 Encounters:  10/23/19 77  01/01/18 96  07/29/17 86   Wt Readings from Last 3 Encounters:  11/16/19 221 lb (100.2 kg)  10/23/19 215 lb (97.5 kg)  04/06/19 208 lb (94.3 kg)   BMI Readings from Last 3 Encounters:  11/16/19 35.67 kg/m  10/23/19 34.70 kg/m  04/06/19 33.57 kg/m    Assessment/Interventions: Review of patient past medical history, allergies, medications, health status, including review of consultants reports, laboratory and other test data, was performed as part of comprehensive evaluation and provision of chronic care management services.   SDOH:  (Social Determinants of Health) assessments and interventions performed: Yes SDOH Interventions    Flowsheet Row Most Recent Value  SDOH Interventions   Financial Strain Interventions Intervention Not Indicated  Transportation Interventions Intervention Not Indicated      SDOH Screenings   Alcohol Screen: Low Risk    Last Alcohol Screening Score (AUDIT): 0  Depression (PHQ2-9): Low Risk    PHQ-2 Score: 0  Financial Resource Strain: Low Risk    Difficulty of Paying Living Expenses: Not hard at all  Food Insecurity: No Food Insecurity   Worried About Charity fundraiser in the Last Year: Never true   Ran Out of Food in the Last Year: Never true  Housing: Low Risk    Last Housing Risk Score: 0  Physical Activity: Inactive   Days of Exercise per Week: 0 days   Minutes of Exercise per Session: 0 min  Social Connections: Moderately Integrated   Frequency of Communication with Friends and Family: More than three times a week   Frequency of Social Gatherings with Friends and Family: More than three times a week   Attends Religious Services: More than 4 times  per year   Active Member of [employer] or Organizations: Yes   Attends Archivist Meetings: More than 4 times per year   Marital Status: Widowed  Stress: No Stress Concern Present   Feeling of Stress : Not at all  Tobacco Use: Low Risk    Smoking Tobacco Use: Never   Smokeless Tobacco Use: Never  Transportation Needs: No Transportation Needs   Lack of Transportation (Medical): No   Lack of Transportation (Non-Medical): No   Patient is usually up at 6 am in the morning and has breakfast with her son and she fixes it there. She does the laundry and a little bit of outside work. Her son is 5 years old and mentally challenged and he  has been living with her.  Patient usually eats bacon and eggs or pancakes or cereal for breakfast. For lunch, patient eats sandwiches or goes out to eat. For dinner, she usually eats meat and a couple of vegetables. She tends to eat more chicken, porkchops, meatloaf or hamburgers and does not eat a lot of desserts, but has the occasional fruit or cookies.   She has 7 children and lots of grandkids and she sees them all quite often. The one that lives the furthest away is in [place]. She usually hosts most of them after church on Sundays.  Patient doesn't have an exercise routine but walks around some. She goes outside for walks sometimes.  Patient sleeps pretty well and feels rested during the day. She usually goes to bed around 10:30pm and gets up 6am. Patient denies any problems going back to sleep. Patient sometimes takes a small nap during the day.  Patient denies any problems with her medications and isn't having any side effects. Patient takes all of her medications in the morning and takes them all without a medication box.  CCM Care Plan  Allergies  Allergen Reactions   Ciprofloxacin     Made her feel like she was [place],nausea   Macrobid [Nitrofurantoin Macrocrystal]     Made her feel like she was [place],nausea   Sulfacetamide  Sodium Itching and Rash   Sulfamethoxazole-Trimethoprim Itching and Rash    Medications Reviewed Today     Reviewed by Viona Gilmore, [facility] (Pharmacist) on 09/21/20 at 1427  Med List Status: <None>   Medication Order Taking? Sig Documenting Provider Last Dose Status Informant  allopurinol (ZYLOPRIM) 100 MG tablet [id number] Yes TAKE 1 TABLET(100 MG) BY MOUTH DAILY Nafziger, Tommi Rumps, NP Taking Active   amLODipine (NORVASC) 2.5 MG tablet [id number] Yes TAKE 1 TABLET(2.5 MG) BY MOUTH DAILY Nafziger, Tommi Rumps, NP Taking Active   latanoprost (XALATAN) 0.005 % ophthalmic solution [id number] Yes INSTILL 1 DROP IN BOTH EYES AT BEDTIME Dorena Cookey, MD Taking Active   losartan-hydrochlorothiazide [facility]) 100-12.5 MG tablet [id number] Yes TAKE 1 TABLET BY MOUTH DAILY Nafziger, Tommi Rumps, NP Taking Active   meloxicam (MOBIC) 7.5 MG tablet [id number] Yes TAKE 1 TABLET(7.5 MG) BY MOUTH DAILY Nafziger, Tommi Rumps, NP Taking Active   mirabegron ER (MYRBETRIQ) 50 MG TB24 tablet [id number] Yes Take 1 tablet (50 mg total) by mouth daily. Nafziger, Tommi Rumps, NP Taking Active   potassium chloride SA (KLOR-CON) 20 MEQ tablet [id number] Yes TAKE 1 TABLET(20 MEQ) BY MOUTH DAILY Dorothyann Peng, NP Taking Active             Patient Active Problem List   Diagnosis Date Noted   Unilateral primary osteoarthritis, left knee 11/16/2019   Unilateral primary osteoarthritis, right knee 11/16/2019   Impingement syndrome of right shoulder 03/13/2018   Venous (peripheral) insufficiency 09/06/2009   GOUT 06/04/2007   DVT 03/10/2007   PHLEBITIS&THROMBOPHLEBITIS LOWER EXTREM UNSPEC 03/07/2007   Essential hypertension 11/28/2006   Osteoarthritis 11/28/2006   History of colonic polyps 11/28/2006   OBESITY NOS 09/27/2006   ESOPHAGITIS NOS 09/27/2006   STATE, SYMPTOMATIC MENOPAUSE/FEM CLIMACTERIC 09/27/2006    Immunization History  Administered Date(s) Administered   Fluad Quad(high Dose 65+) 11/22/2018   Influenza Split 11/29/2011    Influenza Whole 11/18/2007, 12/05/2009, 11/07/2010   Influenza, High Dose Seasonal PF 12/10/2012, 12/23/2014, 12/14/2015, 12/04/2016, 11/29/2017   Influenza,inj,Quad PF,6+ Mos 12/07/2013   Influenza-Unspecified 11/25/2019   PFIZER(Purple Top)SARS-COV-2 Vaccination 04/03/2019, 04/28/2019, 12/02/2019, 09/16/2020   Pneumococcal Conjugate-13  02/02/2013   Pneumococcal Polysaccharide-23 07/29/2008   Td 07/29/2008    Conditions to be addressed/monitored:  Hypertension, Osteoarthritis, Overactive Bladder, Gout, and Glaucoma  Care Plan : [place]  Updates made by Viona Gilmore, [place] since 09/25/2020 12:00 AM     Problem: Problem: Hypertension, Osteoarthritis, Overactive Bladder, Gout, and Glaucoma      Long-Range Goal: Patient-Specific Goal   Start Date: 09/21/2020  Expected End Date: 09/21/2021  This Visit's Progress: On track  Priority: High  Note:   Current Barriers:  Unable to independently monitor therapeutic efficacy  Pharmacist Clinical Goal(s):  Patient will achieve adherence to monitoring guidelines and medication adherence to achieve therapeutic efficacy maintain control of blood pressure as evidenced by home blood pressure monitoring  through collaboration with PharmD and provider.   Interventions: 1:1 collaboration with Dorothyann Peng, NP regarding development and update of comprehensive plan of care as evidenced by provider attestation and co-signature Inter-disciplinary care team collaboration (see longitudinal plan of care) Comprehensive medication review performed; medication list updated in electronic medical record  Hypertension  (Status:Goal on track: YES.)   Med Management Intervention:  No changes necessary  (BP goal <140/90) -Controlled -Current treatment: Amlodipine 2.5 mg 1 tablet daily Losartan-hydrochlorothiazide 100-12.5 mg 1 tablet daily -Medications previously tried: none  -Current home readings: does not have a machine -Current  dietary habits: has cut back on salt some but does eat out -Current exercise habits: walking some but not consistently -Denies hypotensive/hypertensive symptoms -Educated on Exercise goal of 150 minutes per week; Importance of home blood pressure monitoring; Proper BP monitoring technique; -Counseled to monitor BP at home weekly, document, and provide log at future appointments -Counseled on diet and exercise extensively Recommended to continue current medication Recommended switching to lower sodium salt and purchasing an arm BP cuff  Low potassium (Goal: 3.5-5) -Controlled -Current treatment  Potassium chloride 20 mEq 1 tablet daily -Medications previously tried: none  -Recommended to continue current medication  Gout (Goal: prevent flare ups and maintain uric acid < 6) -Controlled -Current treatment  Allopurinol 100 mg 1 tablet daily -Medications previously tried: none  -Counseled on foods/drinks that can increase risk of gout flare ups such as alcohol, fruit juices, sodas, some seafood, red meat and organ meat Recommended repeat uric acid level as this is overdue.  Glaucoma (Goal: lower intraocular pressure) -Controlled -Current treatment  Latanoprost 0.005% 1 drop in both eyes at bedtime -Medications previously tried: none  -Recommended to continue current medication  Overactive bladder (Goal: minimize symptoms) -Controlled -Current treatment  Myrbetriq 50 mg 1 tablet daily -Medications previously tried: none  -Recommended to continue current medication Assessed patient finances. Patient reports this is affordable.  Osteoarthritis (Goal: minimize pain) -Controlled -Current treatment  Meloxicam 7.5 mg 1 tablet daily Voltaren gel as needed -Medications previously tried: n/a  -Counseled on limiting use of NSAIDs due to risk of stomach bleeding and increased BP Recommended use of Tylenol and topicals such as Voltaren gel or capsaicin cream to minimize side  effects   Health Maintenance -Vaccine gaps: tetanus, shingrix (cost) -Current therapy:  No medications -Educated on Cost vs benefit of each product must be carefully weighed by individual consumer -Patient is satisfied with current therapy and denies issues -Counseled on recommended amounts of calcium (1200 mg) and vitamin D (1000 units) daily to support bone health Recommended supplementation with calcium citrate 600 mg and vitamin D 1000 units daily.  Patient Goals/Self-Care Activities Patient will:  - take medications as prescribed check blood  pressure weekly, document, and provide at future appointments target a minimum of 150 minutes of moderate intensity exercise weekly  Follow Up Plan: Telephone follow up appointment with care management team member scheduled for: 6 months       Medication Assistance: None required.  Patient affirms current coverage meets needs.  Compliance/Adherence/Medication fill history: Care Gaps: Shingrix, tetanus, COVID booster  Star-Rating Drugs: Losartan HCTZ 100/12.34m  - [facility] [id number]: [phone]: [phone] Uses pill box? No - takes them all at the same time Pt endorses 100% compliance  We discussed: Benefits of medication synchronization, packaging and delivery as well as enhanced pharmacist oversight with [employer]. Patient decided to: Continue current medication management strategy  Care Plan and Follow Up Patient Decision:  Patient agrees to Care Plan and Follow-up.  Plan: Telephone follow up appointment with care management team member scheduled for:  6 months  MJeni Salles PharmD, BOxfordPharmacist [phone]

## 2020-09-25 NOTE — Patient Instructions (Addendum)
Hi Kristina Allison,  It was great to get to meet you over the telephone! Below is a summary of some of the topics we discussed. Don't forget to look into the cost of the shingles vaccine to protect yourself from the virus.  Also, don't forget to look for a calcium and vitamin D containing product and aim for something that gives you 600 mg calcium citrate and 1000 units of vitamin D daily.   Please reach out to me if you have any questions or need anything before our follow up!  Best, Maddie  Jeni Salles, PharmD, [facility]   Visit Information   Goals Addressed             This Visit's Progress    Track and Manage My Blood Pressure-Hypertension       Timeframe:  Short-Term Goal Priority:  Medium Start Date:                             Expected End Date:                       Follow Up Date 9/31/22    - check blood pressure weekly - choose a place to take my blood pressure (home, clinic or office, retail store) - write blood pressure results in a log or diary    Why is this important?   You won't feel high blood pressure, but it can still hurt your blood vessels.  High blood pressure can cause heart or kidney problems. It can also cause a stroke.  Making lifestyle changes like losing a little weight or eating less salt will help.  Checking your blood pressure at home and at different times of the day can help to control blood pressure.  If the doctor prescribes medicine remember to take it the way the doctor ordered.  Call the office if you cannot afford the medicine or if there are questions about it.     Notes:        Patient Care Plan: CCM Pharmacy Care Plan     Problem Identified: Problem: Hypertension, Osteoarthritis, Overactive Bladder, Gout, and Glaucoma      Long-Range Goal: Patient-Specific Goal   Start Date: 09/21/2020  Expected End Date: 09/21/2021  This Visit's Progress: On track  Priority: High   Note:   Current Barriers:  Unable to independently monitor therapeutic efficacy  Pharmacist Clinical Goal(s):  Patient will achieve adherence to monitoring guidelines and medication adherence to achieve therapeutic efficacy maintain control of blood pressure as evidenced by home blood pressure monitoring  through collaboration with PharmD and provider.   Interventions: 1:1 collaboration with Dorothyann Peng, NP regarding development and update of comprehensive plan of care as evidenced by provider attestation and co-signature Inter-disciplinary care team collaboration (see longitudinal plan of care) Comprehensive medication review performed; medication list updated in electronic medical record  Hypertension  (Status:Goal on track: YES.)   Med Management Intervention:  No changes necessary  (BP goal <140/90) -Controlled -Current treatment: Amlodipine 2.5 mg 1 tablet daily Losartan-hydrochlorothiazide 100-12.5 mg 1 tablet daily -Medications previously tried: none  -Current home readings: does not have a machine -Current dietary habits: has cut back on salt some but does eat out -Current exercise habits: walking some but not consistently -Denies hypotensive/hypertensive symptoms -Educated on Exercise goal of 150 minutes per week; Importance of home blood pressure monitoring; Proper BP monitoring technique; -  Counseled to monitor BP at home weekly, document, and provide log at future appointments -Counseled on diet and exercise extensively Recommended to continue current medication Recommended switching to lower sodium salt and purchasing an arm BP cuff  Low potassium (Goal: 3.5-5) -Controlled -Current treatment  Potassium chloride 20 mEq 1 tablet daily -Medications previously tried: none  -Recommended to continue current medication  Gout (Goal: prevent flare ups and maintain uric acid < 6) -Controlled -Current treatment  Allopurinol 100 mg 1 tablet daily -Medications  previously tried: none  -Counseled on foods/drinks that can increase risk of gout flare ups such as alcohol, fruit juices, sodas, some seafood, red meat and organ meat Recommended repeat uric acid level as this is overdue.  Glaucoma (Goal: lower intraocular pressure) -Controlled -Current treatment  Latanoprost 0.005% 1 drop in both eyes at bedtime -Medications previously tried: none  -Recommended to continue current medication  Overactive bladder (Goal: minimize symptoms) -Controlled -Current treatment  Myrbetriq 50 mg 1 tablet daily -Medications previously tried: none  -Recommended to continue current medication Assessed patient finances. Patient reports this is affordable.  Osteoarthritis (Goal: minimize pain) -Controlled -Current treatment  Meloxicam 7.5 mg 1 tablet daily Voltaren gel as needed -Medications previously tried: n/a  -Counseled on limiting use of NSAIDs due to risk of stomach bleeding and increased BP Recommended use of Tylenol and topicals such as Voltaren gel or capsaicin cream to minimize side effects   Health Maintenance -Vaccine gaps: tetanus, shingrix (cost) -Current therapy:  No medications -Educated on Cost vs benefit of each product must be carefully weighed by individual consumer -Patient is satisfied with current therapy and denies issues -Counseled on recommended amounts of calcium (1200 mg) and vitamin D (1000 units) daily to support bone health Recommended supplementation with calcium citrate 600 mg and vitamin D 1000 units daily.  Patient Goals/Self-Care Activities Patient will:  - take medications as prescribed check blood pressure weekly, document, and provide at future appointments target a minimum of 150 minutes of moderate intensity exercise weekly  Follow Up Plan: Telephone follow up appointment with care management team member scheduled for: 6 months      Kristina Allison was given information about Chronic Care Management services today  including:  CCM service includes personalized support from designated clinical staff supervised by her physician, including individualized plan of care and coordination with other care providers 24/7 contact phone numbers for assistance for urgent and routine care needs. Standard insurance, coinsurance, copays and deductibles apply for chronic care management only during months in which we provide at least 20 minutes of these services. Most insurances cover these services at 100%, however patients may be responsible for any copay, coinsurance and/or deductible if applicable. This service may help you avoid the need for more expensive face-to-face services. Only one practitioner may furnish and bill the service in a calendar month. The patient may stop CCM services at any time (effective at the end of the month) by phone call to the office staff.  Patient agreed to services and verbal consent obtained.   The patient verbalized understanding of instructions, educational materials, and care plan provided today and agreed to receive a mailed copy of patient instructions, educational materials, and care plan.  Telephone follow up appointment with pharmacy team member scheduled for: 6 months  Viona Gilmore, [facility]

## 2020-09-29 ENCOUNTER — Other Ambulatory Visit: Payer: Self-pay | Admitting: Adult Health

## 2020-09-29 DIAGNOSIS — M159 Polyosteoarthritis, unspecified: Secondary | ICD-10-CM

## 2020-09-29 DIAGNOSIS — M8949 Other hypertrophic osteoarthropathy, multiple sites: Secondary | ICD-10-CM

## 2020-10-04 DIAGNOSIS — Z803 Family history of malignant neoplasm of breast: Secondary | ICD-10-CM | POA: Diagnosis not present

## 2020-10-04 DIAGNOSIS — Z1231 Encounter for screening mammogram for malignant neoplasm of breast: Secondary | ICD-10-CM | POA: Diagnosis not present

## 2020-10-16 ENCOUNTER — Other Ambulatory Visit: Payer: Self-pay | Admitting: Adult Health

## 2020-10-16 DIAGNOSIS — N3281 Overactive bladder: Secondary | ICD-10-CM

## 2020-10-19 ENCOUNTER — Encounter: Payer: Self-pay | Admitting: Adult Health

## 2020-10-19 DIAGNOSIS — R928 Other abnormal and inconclusive findings on diagnostic imaging of breast: Secondary | ICD-10-CM | POA: Diagnosis not present

## 2020-10-25 ENCOUNTER — Other Ambulatory Visit: Payer: Self-pay

## 2020-10-25 ENCOUNTER — Encounter: Payer: Self-pay | Admitting: Adult Health

## 2020-10-25 ENCOUNTER — Ambulatory Visit (INDEPENDENT_AMBULATORY_CARE_PROVIDER_SITE_OTHER): Payer: PPO | Admitting: Adult Health

## 2020-10-25 VITALS — BP 120/60 | HR 77 | Temp 98.1°F | Ht 65.0 in | Wt 222.0 lb

## 2020-10-25 DIAGNOSIS — M159 Polyosteoarthritis, unspecified: Secondary | ICD-10-CM

## 2020-10-25 DIAGNOSIS — I10 Essential (primary) hypertension: Secondary | ICD-10-CM | POA: Diagnosis not present

## 2020-10-25 DIAGNOSIS — Z0000 Encounter for general adult medical examination without abnormal findings: Secondary | ICD-10-CM

## 2020-10-25 DIAGNOSIS — N3281 Overactive bladder: Secondary | ICD-10-CM | POA: Diagnosis not present

## 2020-10-25 DIAGNOSIS — M1000 Idiopathic gout, unspecified site: Secondary | ICD-10-CM

## 2020-10-25 DIAGNOSIS — M150 Primary generalized (osteo)arthritis: Secondary | ICD-10-CM

## 2020-10-25 DIAGNOSIS — M8949 Other hypertrophic osteoarthropathy, multiple sites: Secondary | ICD-10-CM

## 2020-10-25 LAB — COMPREHENSIVE METABOLIC PANEL
ALT: 32 U/L (ref 0–35)
AST: 33 U/L (ref 0–37)
Albumin: 3.8 g/dL (ref 3.5–5.2)
Alkaline Phosphatase: 73 U/L (ref 39–117)
BUN: 15 mg/dL (ref 6–23)
CO2: 27 mEq/L (ref 19–32)
Calcium: 9.5 mg/dL (ref 8.4–10.5)
Chloride: 102 mEq/L (ref 96–112)
Creatinine, Ser: 0.71 mg/dL (ref 0.40–1.20)
GFR: 78.51 mL/min (ref >60.00)
Glucose, Bld: 112 mg/dL — ABNORMAL HIGH (ref 70–99)
Potassium: 3.5 mEq/L (ref 3.5–5.1)
Sodium: 139 mEq/L (ref 135–145)
Total Bilirubin: 1.2 mg/dL (ref 0.2–1.2)
Total Protein: 7.3 g/dL (ref 6.0–8.3)

## 2020-10-25 LAB — CBC WITH DIFFERENTIAL/PLATELET
Basophils Absolute: 0.1 10*3/uL (ref 0.0–0.1)
Basophils Relative: 1.2 % (ref 0.0–3.0)
Eosinophils Absolute: 0.5 10*3/uL (ref 0.0–0.7)
Eosinophils Relative: 4.8 % (ref 0.0–5.0)
HCT: 41.4 % (ref 36.0–46.0)
Hemoglobin: 14 g/dL (ref 12.0–15.0)
Lymphocytes Relative: 25.9 % (ref 12.0–46.0)
Lymphs Abs: 2.5 10*3/uL (ref 0.7–4.0)
MCHC: 33.9 g/dL (ref 30.0–36.0)
MCV: 89.3 fl (ref 78.0–100.0)
Monocytes Absolute: 0.6 10*3/uL (ref 0.1–1.0)
Monocytes Relative: 6.6 % (ref 3.0–12.0)
Neutro Abs: 6 10*3/uL (ref 1.4–7.7)
Neutrophils Relative %: 61.5 % (ref 43.0–77.0)
Platelets: 331 10*3/uL (ref 150.0–400.0)
RBC: 4.63 Mil/uL (ref 3.87–5.11)
RDW: 13.6 % (ref 11.5–15.5)
WBC: 9.7 10*3/uL (ref 4.0–10.5)

## 2020-10-25 LAB — LIPID PANEL
Cholesterol: 142 mg/dL (ref 0–200)
HDL: 52 mg/dL (ref >39.00)
LDL Cholesterol: 76 mg/dL (ref 0–99)
NonHDL: 90.45
Total CHOL/HDL Ratio: 3
Triglycerides: 72 mg/dL (ref 0.0–149.0)
VLDL: 14.4 mg/dL (ref 0.0–40.0)

## 2020-10-25 LAB — HEMOGLOBIN A1C: Hgb A1c MFr Bld: 6.8 % — ABNORMAL HIGH (ref 4.6–6.5)

## 2020-10-25 LAB — TSH: TSH: 1.51 u[IU]/mL (ref 0.35–5.50)

## 2020-10-25 LAB — URIC ACID: Uric Acid, Serum: 4.8 mg/dL (ref 2.4–7.0)

## 2020-10-25 NOTE — Patient Instructions (Signed)
It was great seeing you today   We will follow up with you regarding your blood work   Please let me know if you need anything    

## 2020-10-25 NOTE — Progress Notes (Signed)
Subjective:    Patient ID: Kristina Allison, female    DOB: 10-22-1935, 85 y.o.   MRN: [id number]  HPI Patient presents for yearly preventative medicine examination.She  is a pleasant 85 year old female who  has a past medical history of COLONIC POLYPS, HX OF (11/28/2006), DVT (03/10/2007), Edema (02/21/2007), ESOPHAGITIS NOS (09/27/2006), GOUT (06/04/2007), HIP PAIN, LEFT (01/30/2007), HYPERTENSION (11/28/2006), OBESITY NOS (09/27/2006), OSTEOARTHRITIS (11/28/2006), OSTEOARTHROSIS, GENERALIZED, UNSPC SITE (09/27/2006), PHLEBITIS&THROMBOPHLEBITIS LOWER EXTREM UNSPEC (03/07/2007), STATE, SYMPTOMATIC MENOPAUSE/FEM CLIMACTERIC (09/27/2006), and VENOUS INSUFFICIENCY, CHRONIC, RIGHT LEG (09/06/2009).  Gout -continued on allopurinol 100 mg daily.  She denies any recent gout flares  Essential hypertension-well-controlled with Norvasc 2.5 mg and losartan/HCTZ 100-12.5 mg.  She is supplemented with potassium.  She denies chest pain, shortness of breath, dizziness, lightheadedness, headaches, fatigue, or syncopal episodes BP Readings from Last 3 Encounters:  10/25/20 120/60  10/23/19 118/60  10/02/18 140/70   Glaucoma -followed by [facility].  She reports no changes in vision or eye pressures since her last exam in June 2022. Prescribed Latanoprost 0.005% 1 drop in both eyes at bedtime  Osteoarthritis-does not in hips and knees.  Currently prescribed Mobic 7.5 mg daily  OAB-well-controlled with Myrbetriq 50 mg daily, she does have mild leakage in the morning but finds benefit in the Myrbetriq.   All immunizations and health maintenance protocols were reviewed with the patient and needed orders were placed.  Appropriate screening laboratory values were ordered for the patient including screening of hyperlipidemia, renal function and hepatic function.  Medication reconciliation,  past medical history, social history, problem list and allergies were reviewed in detail with the patient  Goals were established with  regard to weight loss, exercise, and  diet in compliance with medications  Wt Readings from Last 3 Encounters:  10/25/20 222 lb (100.7 kg)  11/16/19 221 lb (100.2 kg)  10/23/19 215 lb (97.5 kg)    Review of Systems  Constitutional: Negative.   HENT: Negative.    Eyes: Negative.   Respiratory: Negative.    Cardiovascular: Negative.   Gastrointestinal: Negative.   Endocrine: Negative.   Genitourinary: Negative.   Musculoskeletal:  Positive for arthralgias.  Skin: Negative.   Allergic/Immunologic: Negative.   Neurological: Negative.   Hematological: Negative.   Psychiatric/Behavioral: Negative.    Past Medical History:  Diagnosis Date   COLONIC POLYPS, HX OF 11/28/2006   DVT 03/10/2007   Edema 02/21/2007   ESOPHAGITIS NOS 09/27/2006   GOUT 06/04/2007   HIP PAIN, LEFT 01/30/2007   HYPERTENSION 11/28/2006   OBESITY NOS 09/27/2006   OSTEOARTHRITIS 11/28/2006   OSTEOARTHROSIS, GENERALIZED, UNSPC SITE 09/27/2006   PHLEBITIS&THROMBOPHLEBITIS LOWER EXTREM UNSPEC 03/07/2007   STATE, SYMPTOMATIC MENOPAUSE/FEM CLIMACTERIC 09/27/2006   VENOUS INSUFFICIENCY, CHRONIC, RIGHT LEG 09/06/2009    Social History   Socioeconomic History   Marital status: Widowed    Spouse name: Not on file   Number of children: 7   Years of education: 12   Highest education level: High school graduate  Occupational History   Occupation: retired  Tobacco Use   Smoking status: Never   Smokeless tobacco: Never  Substance and Sexual Activity   Alcohol use: No   Drug use: No   Sexual activity: Not on file  Other Topics Concern   Not on file  Social History Narrative   Widowed   [place] : son lives with her   Likes word searches, reading, television   Social Determinants of Health   Financial Resource Strain: Low Risk  Difficulty of Paying Living Expenses: Not hard at all  Food Insecurity: No Food Insecurity   Worried About [place] in the Last Year: Never true   Ran Out of Food in the Last Year:  Never true  Transportation Needs: No Transportation Needs   Lack of Transportation (Medical): No   Lack of Transportation (Non-Medical): No  Physical Activity: Inactive   Days of Exercise per Week: 0 days   Minutes of Exercise per Session: 0 min  Stress: No Stress Concern Present   Feeling of Stress : Not at all  Social Connections: Moderately Integrated   Frequency of Communication with Friends and Family: More than three times a week   Frequency of Social Gatherings with Friends and Family: More than three times a week   Attends Religious Services: More than 4 times per year   Active Member of [employer] or Organizations: Yes   Attends Archivist Meetings: More than 4 times per year   Marital Status: Widowed  Human resources officer Violence: Not At Risk   Fear of Current or Ex-Partner: No   Emotionally Abused: No   Physically Abused: No   Sexually Abused: No    Past Surgical History:  Procedure Laterality Date   ABDOMINAL HYSTERECTOMY      History reviewed. No pertinent family history.  Allergies  Allergen Reactions   Ciprofloxacin     Made her feel like she was [place],nausea   Macrobid [Nitrofurantoin Macrocrystal]     Made her feel like she was [place],nausea   Sulfacetamide Sodium Itching and Rash   Sulfamethoxazole-Trimethoprim Itching and Rash    Current Outpatient Medications on File Prior to Visit  Medication Sig Dispense Refill   allopurinol (ZYLOPRIM) 100 MG tablet TAKE 1 TABLET(100 MG) BY MOUTH DAILY 90 tablet 3   amLODipine (NORVASC) 2.5 MG tablet TAKE 1 TABLET(2.5 MG) BY MOUTH DAILY 90 tablet 3   latanoprost (XALATAN) 0.005 % ophthalmic solution INSTILL 1 DROP IN BOTH EYES AT BEDTIME 10 mL 1   losartan-hydrochlorothiazide (HYZAAR) 100-12.5 MG tablet TAKE 1 TABLET BY MOUTH DAILY 30 tablet 0   meloxicam (MOBIC) 7.5 MG tablet TAKE 1 TABLET(7.5 MG) BY MOUTH DAILY 30 tablet 0   MYRBETRIQ 50 MG TB24 tablet TAKE 1 TABLET(50 MG) BY MOUTH DAILY 30  tablet 11   potassium chloride SA (KLOR-CON) 20 MEQ tablet TAKE 1 TABLET(20 MEQ) BY MOUTH DAILY 90 tablet 3   PFIZER-BIONT COVID-19 VAC-TRIS SUSP injection      No current facility-administered medications on file prior to visit.    BP 120/60   Pulse 77   Temp 98.1 F (36.7 C) (Oral)   Ht '5\' 5"'$  (1.651 m)   Wt 222 lb (100.7 kg)   SpO2 97%   BMI 36.94 kg/m       Objective:   Physical Exam Vitals and nursing note reviewed.  Constitutional:      General: She is not in acute distress.    Appearance: Normal appearance. She is well-developed. She is not ill-appearing.  HENT:     Head: Normocephalic and atraumatic.     Right Ear: Tympanic membrane, ear canal and external ear normal. There is no impacted cerumen.     Left Ear: Tympanic membrane, ear canal and external ear normal. There is no impacted cerumen.     Nose: Nose normal. No congestion or rhinorrhea.     Mouth/Throat:     Mouth: Mucous membranes are moist.  Pharynx: Oropharynx is clear. No oropharyngeal exudate or posterior oropharyngeal erythema.  Eyes:     General:        Right eye: No discharge.        Left eye: No discharge.     Extraocular Movements: Extraocular movements intact.     Conjunctiva/sclera: Conjunctivae normal.     Pupils: Pupils are equal, round, and reactive to light.  Neck:     Thyroid: No thyromegaly.     Vascular: No carotid bruit.     Trachea: No tracheal deviation.  Cardiovascular:     Rate and Rhythm: Normal rate and regular rhythm.     Pulses: Normal pulses.     Heart sounds: Normal heart sounds. No murmur heard.   No friction rub. No gallop.  Pulmonary:     Effort: Pulmonary effort is normal. No respiratory distress.     Breath sounds: Normal breath sounds. No stridor. No wheezing, rhonchi or rales.  Chest:     Chest wall: No tenderness.  Abdominal:     General: Abdomen is flat. Bowel sounds are normal. There is no distension.     Palpations: Abdomen is soft. There is no mass.      Tenderness: There is no abdominal tenderness. There is no right CVA tenderness, left CVA tenderness, guarding or rebound.     Hernia: No hernia is present.  Musculoskeletal:        General: No swelling, tenderness, deformity or signs of injury. Normal range of motion.     Cervical back: Normal range of motion and neck supple.     Right lower leg: No edema.     Left lower leg: No edema.  Lymphadenopathy:     Cervical: No cervical adenopathy.  Skin:    General: Skin is warm and dry.     Coloration: Skin is not jaundiced or pale.     Findings: No bruising, erythema, lesion or rash.  Neurological:     General: No focal deficit present.     Mental Status: She is alert and oriented to person, place, and time.     Cranial Nerves: No cranial nerve deficit.     Sensory: No sensory deficit.     Motor: No weakness.     Coordination: Coordination normal.     Gait: Gait normal.     Deep Tendon Reflexes: Reflexes normal.  Psychiatric:        Mood and Affect: Mood normal.        Behavior: Behavior normal.        Thought Content: Thought content normal.        Judgment: Judgment normal.      Assessment & Plan:  1. Routine general medical examination at a health care facility -Encouraged more frequent exercise.  Follow-up in 1 year or sooner if needed - CBC with Differential/Platelet; Future - Comprehensive metabolic panel; Future - Lipid panel; Future - TSH; Future - Hemoglobin A1c; Future  2. Essential hypertension -Continue with Norvasc 2.5 mg and Hyzaar 100-25 mg daily - CBC with Differential/Platelet; Future - Comprehensive metabolic panel; Future - Lipid panel; Future - TSH; Future - Hemoglobin A1c; Future  3. OAB (overactive bladder) -Continue with Myrbetriq 50 mg daily - CBC with Differential/Platelet; Future - Comprehensive metabolic panel; Future - Lipid panel; Future - TSH; Future  4. Idiopathic gout, unspecified chronicity, unspecified site -Consider increasing  allopurinol - Uric Acid; Future  5. Primary osteoarthritis involving multiple joints -Continue with Mobic 7.5 mg daily  Tommi Rumps  Felise Georgia, NP

## 2020-10-26 ENCOUNTER — Other Ambulatory Visit: Payer: Self-pay | Admitting: Adult Health

## 2020-10-26 DIAGNOSIS — M1000 Idiopathic gout, unspecified site: Secondary | ICD-10-CM

## 2020-10-26 DIAGNOSIS — I10 Essential (primary) hypertension: Secondary | ICD-10-CM

## 2020-10-26 MED ORDER — POTASSIUM CHLORIDE CRYS ER 20 MEQ PO TBCR: EXTENDED_RELEASE_TABLET | ORAL | 3 refills | Status: DC

## 2020-10-26 MED ORDER — AMLODIPINE BESYLATE 2.5 MG PO TABS: ORAL_TABLET | ORAL | 3 refills | Status: DC

## 2020-10-26 MED ORDER — ALLOPURINOL 100 MG PO TABS: ORAL_TABLET | ORAL | 3 refills | Status: DC

## 2020-10-26 MED ORDER — LOSARTAN POTASSIUM-HCTZ 100-12.5 MG PO TABS: 1.0000 | ORAL_TABLET | Freq: Every day | ORAL | 3 refills | Status: DC

## 2020-10-28 ENCOUNTER — Other Ambulatory Visit: Payer: Self-pay

## 2020-10-28 DIAGNOSIS — N6011 Diffuse cystic mastopathy of right breast: Secondary | ICD-10-CM | POA: Diagnosis not present

## 2020-10-28 DIAGNOSIS — D241 Benign neoplasm of right breast: Secondary | ICD-10-CM | POA: Diagnosis not present

## 2020-10-28 DIAGNOSIS — N6315 Unspecified lump in the right breast, overlapping quadrants: Secondary | ICD-10-CM | POA: Diagnosis not present

## 2020-10-28 DIAGNOSIS — N6314 Unspecified lump in the right breast, lower inner quadrant: Secondary | ICD-10-CM | POA: Diagnosis not present

## 2020-11-08 ENCOUNTER — Ambulatory Visit: Payer: Self-pay | Admitting: Surgery

## 2020-11-08 DIAGNOSIS — D241 Benign neoplasm of right breast: Secondary | ICD-10-CM | POA: Diagnosis not present

## 2020-11-15 ENCOUNTER — Telehealth: Payer: Self-pay | Admitting: Pharmacist

## 2020-11-15 NOTE — Chronic Care Management (AMB) (Signed)
Chronic Care Management Pharmacy Assistant   Name: Kristina Allison  MRN: [id number] DOB: 1935/09/09   Reason for Encounter: Disease State/ Hypertension Assessment Call.   Conditions to be addressed/monitored: HTN   Recent office visits:  10/25/20 Dorothyann Peng NP (PCP) - seen for routine general medical examination and other chronic conditions. No medication changes. Will follow up regarding blood work.  Recent consult visits:  11/08/20 Erroll Luna MD (General Surgery) - seen for initial consult due to cyst in right breast. No medication changes or follow up noted.  Hospital visits:  None in previous 6 months  Medications: Outpatient Encounter Medications as of 11/15/2020  Medication Sig   allopurinol (ZYLOPRIM) 100 MG tablet TAKE 1 TABLET(100 MG) BY MOUTH DAILY   amLODipine (NORVASC) 2.5 MG tablet TAKE 1 TABLET(2.5 MG) BY MOUTH DAILY   latanoprost (XALATAN) 0.005 % ophthalmic solution INSTILL 1 DROP IN BOTH EYES AT BEDTIME   losartan-hydrochlorothiazide (HYZAAR) 100-12.5 MG tablet Take 1 tablet by mouth daily.   meloxicam (MOBIC) 7.5 MG tablet TAKE 1 TABLET(7.5 MG) BY MOUTH DAILY   MYRBETRIQ 50 MG TB24 tablet TAKE 1 TABLET(50 MG) BY MOUTH DAILY   PFIZER-BIONT COVID-19 VAC-TRIS SUSP injection    potassium chloride SA (KLOR-CON) 20 MEQ tablet TAKE 1 TABLET(20 MEQ) BY MOUTH DAILY   No facility-administered encounter medications on file as of 11/15/2020.   Fill History: ALLOPURINOL 100MG  TABLETS 10/26/2020 90   LATANOPROST 0.005% OPHTH SOLN 2.5ML 09/13/2020 75   LOSARTAN/HCTZ 100/12.5MG  TABLETS 10/01/2020 90   MELOXICAM 7.5MG  TABLETS 09/29/2020 30   MYRBETRIQ 50MG  TABLETS 10/17/2020 30   POTASSIUM CL 20MEQ ER TABLETS 10/26/2020 90   AMLODIPINE BESYLATE 2.5MG  TABLETS 10/26/2020 90   Reviewed chart prior to disease state call. Spoke with patient regarding BP  Recent Office Vitals: BP Readings from Last 3 Encounters:  10/25/20 120/60  10/23/19 118/60  10/02/18  140/70   Pulse Readings from Last 3 Encounters:  10/25/20 77  10/23/19 77  01/01/18 96    Wt Readings from Last 3 Encounters:  10/25/20 222 lb (100.7 kg)  11/16/19 221 lb (100.2 kg)  10/23/19 215 lb (97.5 kg)     Kidney Function Lab Results  Component Value Date/Time   CREATININE 0.71 10/25/2020 09:25 AM   CREATININE 0.74 10/23/2019 10:52 AM   CREATININE 0.67 10/02/2018 10:57 AM   GFR 78.51 10/25/2020 09:25 AM   GFRNONAA 75 10/23/2019 10:52 AM   GFRAA 87 10/23/2019 10:52 AM    BMP Latest Ref Rng & Units 10/25/2020 10/23/2019 10/02/2018  Glucose 70 - 99 mg/dL 112(H) 107(H) 103(H)  BUN 6 - 23 mg/dL 15 19 19   Creatinine 0.40 - 1.20 mg/dL 0.71 0.74 0.67  BUN/Creat Ratio 6 - 22 (calc) - NOT APPLICABLE -  Sodium 591 - 145 mEq/L 139 139 140  Potassium 3.5 - 5.1 mEq/L 3.5 4.3 4.4  Chloride 96 - 112 mEq/L 102 102 104  CO2 19 - 32 mEq/L 27 27 28   Calcium 8.4 - 10.5 mg/dL 9.5 9.6 10.0    Current antihypertensive regimen:  Amlodipine 2.5mg  - take 1 tablet by mouth daily. Losartan-hydrochlorothiazide 100-12.5mg  - take 1 tablet by mouth daily. How often are you checking your Blood Pressure? Has not been checking lately.  Current home BP readings: None to report.  What recent interventions/DTPs have been made by any provider to improve Blood Pressure control since last CPP Visit: None. Any recent hospitalizations or ED visits since last visit with CPP? No  Adherence Review: Is  the patient currently on ACE/ARB medication? Yes Does the patient have >5 day gap between last estimated fill dates? No  Notes: Spoke with patient and reviewed all medications as listed. Patient reports taking all medications as prescribed and no issues at this time. Patient has not gotten a blood pressure cuff yet or been checking her blood pressure. I re encouraged patient to get a cuff and check her blood pressure weekly and keep a log. Patient was agreeable. Patient ate some cereal this morning and a salad for  lunch. Patient does not add salt to her food or eat a lot of red meat. She drinks plenty of water daily as well. Patient walks and works in her yard and garden daily. Patient thanked me for my call.    Care Gaps:  Zoster Vaccine - Overdue Tetanus - Overdue COVID Booster #4 - Overdue Therapist, music) AWV- Scheduled - 04/27/21   Star Rating Drugs:  Losartan HCTZ 100/12.5mg   30DS - Last filled 10/01/2020 90DS at [facility] Pharmacist Assistant [phone]

## 2020-11-28 ENCOUNTER — Other Ambulatory Visit: Payer: Self-pay | Admitting: Adult Health

## 2020-11-28 DIAGNOSIS — M159 Polyosteoarthritis, unspecified: Secondary | ICD-10-CM

## 2020-12-05 ENCOUNTER — Telehealth: Payer: Self-pay | Admitting: Adult Health

## 2020-12-05 NOTE — Telephone Encounter (Signed)
Patient called stating that usually when she picks up her prescription for, it costs her about $45 out of pocket. She says that it is now going to cost her $200-$300 out of pocket and she cannot afford that.  She would like to know if she should stop taking the medication or if there are other options she can have due to the costs being so high.  Patient's contact number is [phone].  Please advise.

## 2020-12-08 NOTE — Telephone Encounter (Signed)
FYI  Spoke to pt and she stated the medication is the MYRBETRIQ 50 MG TB24 tablet for her overactive bladder. I advised pt that it sounds like an insurance issue. I articulated that pt should reach out to insurance company to see what the issue is and call us back with an update. Pt verbalized understanding. No further action needed.

## 2020-12-08 NOTE — Telephone Encounter (Signed)
Patient called back stating insurance is max out for MYRBETRIQ 50 MG TB24 tablet for the year and wants to you if an alternative can be sent to pharmacy

## 2020-12-08 NOTE — Telephone Encounter (Signed)
Noted! Please advised if there is an alternative to Rx.

## 2020-12-09 ENCOUNTER — Other Ambulatory Visit: Payer: Self-pay | Admitting: Adult Health

## 2020-12-09 MED ORDER — TOLTERODINE TARTRATE ER 2 MG PO CP24: 2.0000 mg | ORAL_CAPSULE | Freq: Every day | ORAL | 1 refills | Status: DC

## 2020-12-09 NOTE — Telephone Encounter (Signed)
Pt notified of PCP response & verb understanding. 

## 2021-01-26 ENCOUNTER — Ambulatory Visit (INDEPENDENT_AMBULATORY_CARE_PROVIDER_SITE_OTHER): Payer: PPO | Admitting: Adult Health

## 2021-01-26 VITALS — BP 142/70 | HR 94 | Temp 97.0°F | Ht 65.0 in | Wt 222.0 lb

## 2021-01-26 DIAGNOSIS — I10 Essential (primary) hypertension: Secondary | ICD-10-CM

## 2021-01-26 DIAGNOSIS — E118 Type 2 diabetes mellitus with unspecified complications: Secondary | ICD-10-CM | POA: Diagnosis not present

## 2021-01-26 LAB — POCT GLYCOSYLATED HEMOGLOBIN (HGB A1C): HbA1c, POC (controlled diabetic range): 6.5 % (ref 0.0–7.0)

## 2021-01-26 MED ORDER — LOSARTAN POTASSIUM-HCTZ 100-25 MG PO TABS: 1.0000 | ORAL_TABLET | Freq: Every day | ORAL | 1 refills | Status: DC

## 2021-01-26 NOTE — Patient Instructions (Signed)
Your A1c has improved 6.5 which has improved. Continue to work on reducing your carbs and sugars  I have sent in a new prescription for your Hyzaar. Take the new prescription instead of the old medication .  I will see you back in August for your physical exam

## 2021-01-26 NOTE — Progress Notes (Signed)
Subjective:    Patient ID: Kristina Allison, female    DOB: 1935/07/06, 85 y.o.   MRN: [id number]  HPI  85 year old female who  has a past medical history of COLONIC POLYPS, HX OF (11/28/2006), DVT (03/10/2007), Edema (02/21/2007), ESOPHAGITIS NOS (09/27/2006), GOUT (06/04/2007), HIP PAIN, LEFT (01/30/2007), HYPERTENSION (11/28/2006), OBESITY NOS (09/27/2006), OSTEOARTHRITIS (11/28/2006), OSTEOARTHROSIS, GENERALIZED, UNSPC SITE (09/27/2006), PHLEBITIS&THROMBOPHLEBITIS LOWER EXTREM UNSPEC (03/07/2007), STATE, SYMPTOMATIC MENOPAUSE/FEM CLIMACTERIC (09/27/2006), and VENOUS INSUFFICIENCY, CHRONIC, RIGHT LEG (09/06/2009).  She presents to the office today for 47-month follow-up regarding diabetes and hypertension.  Diabetes mellitus type 2-diagnosed in August 2022 her CPE where her A1c was 6.8.  She was advised to work on lifestyle modifications before starting medication.  Today she returns stating that not much has changes with her diet.   Wt Readings from Last 3 Encounters:  01/26/21 222 lb (100.7 kg)  10/25/20 222 lb (100.7 kg)  11/16/19 221 lb (100.2 kg)   HTN -currently managed well with Hyzaar 100-12.5 mg and Norvasc 2.5 mg daily.  She denies dizziness, lightheadedness, chest pain. She does notice that she develops some lower extremity edema that is worse at night. She has been monitoring her blood pressures at home with readings consistently in the 140-150's/70-80s BP Readings from Last 3 Encounters:  01/26/21 (!) 142/70  10/25/20 120/60  10/23/19 118/60    Review of Systems See HPI   Past Medical History:  Diagnosis Date   COLONIC POLYPS, HX OF 11/28/2006   DVT 03/10/2007   Edema 02/21/2007   ESOPHAGITIS NOS 09/27/2006   GOUT 06/04/2007   HIP PAIN, LEFT 01/30/2007   HYPERTENSION 11/28/2006   OBESITY NOS 09/27/2006   OSTEOARTHRITIS 11/28/2006   OSTEOARTHROSIS, GENERALIZED, UNSPC SITE 09/27/2006   PHLEBITIS&THROMBOPHLEBITIS LOWER EXTREM UNSPEC 03/07/2007   STATE, SYMPTOMATIC MENOPAUSE/FEM CLIMACTERIC  09/27/2006   VENOUS INSUFFICIENCY, CHRONIC, RIGHT LEG 09/06/2009    Social History   Socioeconomic History   Marital status: Widowed    Spouse name: Not on file   Number of children: 7   Years of education: 12   Highest education level: High school graduate  Occupational History   Occupation: retired  Tobacco Use   Smoking status: Never   Smokeless tobacco: Never  Substance and Sexual Activity   Alcohol use: No   Drug use: No   Sexual activity: Not on file  Other Topics Concern   Not on file  Social History Narrative   Widowed   [place] 2 : son lives with her   Likes word searches, reading, television   Social Determinants of Health   Financial Resource Strain: Low Risk    Difficulty of Paying Living Expenses: Not hard at all  Food Insecurity: No Food Insecurity   Worried About Charity fundraiser in the Last Year: Never true   Arboriculturist in the Last Year: Never true  Transportation Needs: No Transportation Needs   Lack of Transportation (Medical): No   Lack of Transportation (Non-Medical): No  Physical Activity: Inactive   Days of Exercise per Week: 0 days   Minutes of Exercise per Session: 0 min  Stress: No Stress Concern Present   Feeling of Stress : Not at all  Social Connections: Moderately Integrated   Frequency of Communication with Friends and Family: More than three times a week   Frequency of Social Gatherings with Friends and Family: More than three times a week   Attends Religious Services: More than 4 times per year  Active Member of Clubs or Organizations: Yes   Attends Archivist Meetings: More than 4 times per year   Marital Status: Widowed  Human resources officer Violence: Not At Risk   Fear of Current or Ex-Partner: No   Emotionally Abused: No   Physically Abused: No   Sexually Abused: No    Past Surgical History:  Procedure Laterality Date   ABDOMINAL HYSTERECTOMY      No family history on file.  Allergies  Allergen Reactions    Ciprofloxacin     Made her feel like she was [place],nausea   Macrobid [Nitrofurantoin Macrocrystal]     Made her feel like she was [place],nausea   Sulfacetamide Sodium Itching and Rash   Sulfamethoxazole-Trimethoprim Itching and Rash    Current Outpatient Medications on File Prior to Visit  Medication Sig Dispense Refill   allopurinol (ZYLOPRIM) 100 MG tablet TAKE 1 TABLET(100 MG) BY MOUTH DAILY 90 tablet 3   amLODipine (NORVASC) 2.5 MG tablet TAKE 1 TABLET(2.5 MG) BY MOUTH DAILY 90 tablet 3   calcium carbonate (CALCIUM 600) 600 MG TABS tablet Take 600 mg by mouth 2 (two) times daily with a meal.     latanoprost (XALATAN) 0.005 % ophthalmic solution INSTILL 1 DROP IN BOTH EYES AT BEDTIME 10 mL 1   losartan-hydrochlorothiazide (HYZAAR) 100-12.5 MG tablet Take 1 tablet by mouth daily. 90 tablet 3   meloxicam (MOBIC) 7.5 MG tablet TAKE 1 TABLET(7.5 MG) BY MOUTH DAILY 90 tablet 1   potassium chloride SA (KLOR-CON) 20 MEQ tablet TAKE 1 TABLET(20 MEQ) BY MOUTH DAILY 90 tablet 3   Vitamin D, Cholecalciferol, 25 MCG (1000 UT) CAPS Take by mouth daily.     No current facility-administered medications on file prior to visit.    BP (!) 142/70   Pulse 94   Temp (!) 97 F (36.1 C) (Oral)   Ht 5\' 5"  (1.651 m)   Wt 222 lb (100.7 kg)   SpO2 96%   BMI 36.94 kg/m    Past Medical History:  Diagnosis Date   COLONIC POLYPS, HX OF 11/28/2006   DVT 03/10/2007   Edema 02/21/2007   ESOPHAGITIS NOS 09/27/2006   GOUT 06/04/2007   HIP PAIN, LEFT 01/30/2007   HYPERTENSION 11/28/2006   OBESITY NOS 09/27/2006   OSTEOARTHRITIS 11/28/2006   OSTEOARTHROSIS, GENERALIZED, UNSPC SITE 09/27/2006   PHLEBITIS&THROMBOPHLEBITIS LOWER EXTREM UNSPEC 03/07/2007   STATE, SYMPTOMATIC MENOPAUSE/FEM CLIMACTERIC 09/27/2006   VENOUS INSUFFICIENCY, CHRONIC, RIGHT LEG 09/06/2009    Social History   Socioeconomic History   Marital status: Widowed    Spouse name: Not on file   Number of children: 7   Years of  education: 12   Highest education level: High school graduate  Occupational History   Occupation: retired  Tobacco Use   Smoking status: Never   Smokeless tobacco: Never  Substance and Sexual Activity   Alcohol use: No   Drug use: No   Sexual activity: Not on file  Other Topics Concern   Not on file  Social History Narrative   Widowed   [place] 2 : son lives with her   Likes word searches, reading, television   Social Determinants of Health   Financial Resource Strain: Low Risk    Difficulty of Paying Living Expenses: Not hard at all  Food Insecurity: No Food Insecurity   Worried About Charity fundraiser in the Last Year: Never true   [place] in the Last Year: Never  true  Transportation Needs: No Transportation Needs   Lack of Transportation (Medical): No   Lack of Transportation (Non-Medical): No  Physical Activity: Inactive   Days of Exercise per Week: 0 days   Minutes of Exercise per Session: 0 min  Stress: No Stress Concern Present   Feeling of Stress : Not at all  Social Connections: Moderately Integrated   Frequency of Communication with Friends and Family: More than three times a week   Frequency of Social Gatherings with Friends and Family: More than three times a week   Attends Religious Services: More than 4 times per year   Active Member of [employer] or Organizations: Yes   Attends Archivist Meetings: More than 4 times per year   Marital Status: Widowed  Human resources officer Violence: Not At Risk   Fear of Current or Ex-Partner: No   Emotionally Abused: No   Physically Abused: No   Sexually Abused: No    Past Surgical History:  Procedure Laterality Date   ABDOMINAL HYSTERECTOMY      No family history on file.  Allergies  Allergen Reactions   Ciprofloxacin     Made her feel like she was in [place],nausea   Macrobid [Nitrofurantoin Macrocrystal]     Made her feel like she was in [place],nausea   Sulfacetamide Sodium Itching and Rash    Sulfamethoxazole-Trimethoprim Itching and Rash    Current Outpatient Medications on File Prior to Visit  Medication Sig Dispense Refill   allopurinol (ZYLOPRIM) 100 MG tablet TAKE 1 TABLET(100 MG) BY MOUTH DAILY 90 tablet 3   amLODipine (NORVASC) 2.5 MG tablet TAKE 1 TABLET(2.5 MG) BY MOUTH DAILY 90 tablet 3   calcium carbonate (CALCIUM 600) 600 MG TABS tablet Take 600 mg by mouth 2 (two) times daily with a meal.     latanoprost (XALATAN) 0.005 % ophthalmic solution INSTILL 1 DROP IN BOTH EYES AT BEDTIME 10 mL 1   losartan-hydrochlorothiazide (HYZAAR) 100-12.5 MG tablet Take 1 tablet by mouth daily. 90 tablet 3   meloxicam (MOBIC) 7.5 MG tablet TAKE 1 TABLET(7.5 MG) BY MOUTH DAILY 90 tablet 1   potassium chloride SA (KLOR-CON) 20 MEQ tablet TAKE 1 TABLET(20 MEQ) BY MOUTH DAILY 90 tablet 3   Vitamin D, Cholecalciferol, 25 MCG (1000 UT) CAPS Take by mouth daily.     No current facility-administered medications on file prior to visit.    BP (!) 142/70   Pulse 94   Temp (!) 97 F (36.1 C) (Oral)   Ht 5\' 5"  (1.651 m)   Wt 222 lb (100.7 kg)   SpO2 96%   BMI 36.94 kg/m        Objective:   Physical Exam Constitutional:      Appearance: Normal appearance.  Cardiovascular:     Rate and Rhythm: Normal rate and regular rhythm.     Pulses: Normal pulses.     Heart sounds: Normal heart sounds.  Pulmonary:     Effort: Pulmonary effort is normal.     Breath sounds: Normal breath sounds.  Musculoskeletal:     Right lower leg: No edema.     Left lower leg: No edema.  Skin:    General: Skin is warm and dry.  Neurological:     General: No focal deficit present.     Mental Status: She is alert and oriented to person, place, and time.  Psychiatric:        Mood and Affect: Mood normal.  Behavior: Behavior normal.        Thought Content: Thought content normal.        Judgment: Judgment normal.       Assessment & Plan:  1. Essential hypertension - Will increase Hyzaar to  100-25 mg  - losartan-hydrochlorothiazide (HYZAAR) 100-25 MG tablet; Take 1 tablet by mouth daily.  Dispense: 90 tablet; Refill: 1  2. Controlled type 2 diabetes mellitus with complication, without long-term current use of insulin (HCC)  - POC HgB A1c- 6.5  - Improved. Encouraged to continue to work on reducing carbs and sugars. Will not start on medication due to age - Follow up in 6 months   Dorothyann Peng

## 2021-02-06 DIAGNOSIS — H401131 Primary open-angle glaucoma, bilateral, mild stage: Secondary | ICD-10-CM | POA: Diagnosis not present

## 2021-03-20 ENCOUNTER — Telehealth: Payer: Self-pay | Admitting: Pharmacist

## 2021-03-20 NOTE — Chronic Care Management (AMB) (Signed)
° ° °  Chronic Care Management Pharmacy Assistant   Name: Kristina Allison  MRN: [id number] DOB: [birth year]  03/22/2021 APPOINTMENT REMINDER  Kristina Allison was reminded to have all medications, supplements and any blood glucose and blood pressure readings available for review with Kristina Allison, Pharm. D, at her telephone visit on 03/22/2021 at 2:00.   Questions: Have you had any recent office visit or specialist visit outside of [place]? Patient denies any visits outside of [facility].  Are there any concerns you would like to discuss during your office visit? Patient denies any concerns at this time.    Are you having any problems obtaining your medications? (Whether it pharmacy issues or cost) Patient denies any issues obtaining medications.   If patient has any PAP medications ask if they are having any problems getting their PAP medication or refill? Patient denies any medications filled by PAP  Care Gaps: AWV- Scheduled - 04/27/21 Last BP - 142/70 on 01/26/2021 Zoster vaccine - never done TDAP - overdue Flu vaccine - overdue Mammogram - overdue Covid vaccine - overdue  Star Rating Drug: Losartan HCTZ 100/12.5mg  - Last filled 01/26/2021 90DS at [facility]  Any gaps in medications fill history? No  [name]  Catering manager [phone]

## 2021-03-22 ENCOUNTER — Ambulatory Visit (INDEPENDENT_AMBULATORY_CARE_PROVIDER_SITE_OTHER): Payer: PPO | Admitting: Pharmacist

## 2021-03-22 DIAGNOSIS — N3281 Overactive bladder: Secondary | ICD-10-CM

## 2021-03-22 DIAGNOSIS — I10 Essential (primary) hypertension: Secondary | ICD-10-CM

## 2021-03-22 NOTE — Progress Notes (Signed)
Chronic Care Management Pharmacy Note  03/22/2021 Name:  Kristina Allison MRN:  [id number] DOB:  07/24/1935  Summary: BP is at goal < 140/90 per home readings  Recommendations/Changes made from today's visit: -Recommended restarting  -Recommended continued weekly BP monitoring at home  Plan: Follow up BP assessment in 2 months   Subjective: Kristina Allison is an 86 y.o. year old female who is a primary patient of Shirline Frees, NP.  The CCM team was consulted for assistance with disease management and care coordination needs.    Engaged with patient by telephone for follow up visit in response to provider referral for pharmacy case management and/or care coordination services.   Consent to Services:  The patient was given information about Chronic Care Management services, agreed to services, and gave verbal consent prior to initiation of services.  Please see initial visit note for detailed documentation.   Patient Care Team: Shirline Frees, NP as PCP - General (Family Medicine) Verner Chol, [facility] as Pharmacist (Pharmacist)  Recent office visits: 01/26/21 Shirline Frees, NP: Patient presented for diabetes and HTN follow up. Increased Hyzaar to 100-25 mg daily. A1c decreased to 6.5%.   10/25/20 Shirline Frees, NP: Patient presented for annual exam. No medication changes. Will follow up regarding blood work.  Recent consult visits: 02/06/21 Mack [place] (ophthalmology): Patient presented for primary open-angle glaucoma. Unable to access notes.  11/08/20 Harriette Bouillon MD (General Surgery) - seen for initial consult due to cyst in right breast. No medication changes or follow up noted.  Hospital visits: None in previous 6 months   Objective:  Lab Results  Component Value Date   CREATININE 0.71 10/25/2020   BUN 15 10/25/2020   GFR 78.51 10/25/2020   GFRNONAA 75 10/23/2019   GFRAA 87 10/23/2019   NA 139 10/25/2020   K 3.5 10/25/2020   CALCIUM 9.5 10/25/2020   CO2  27 10/25/2020   GLUCOSE 112 (H) 10/25/2020    Lab Results  Component Value Date/Time   HGBA1C 6.5 01/26/2021 08:58 AM   HGBA1C 6.8 (H) 10/25/2020 09:25 AM   GFR 78.51 10/25/2020 09:25 AM   GFR 84.35 10/02/2018 10:57 AM    Last diabetic Eye exam: No results found for: HMDIABEYEEXA  Last diabetic Foot exam: No results found for: HMDIABFOOTEX   Lab Results  Component Value Date   CHOL 142 10/25/2020   HDL 52.00 10/25/2020   LDLCALC 76 10/25/2020   TRIG 72.0 10/25/2020   CHOLHDL 3 10/25/2020    Hepatic Function Latest Ref Rng & Units 10/25/2020 10/23/2019 10/02/2018  Total Protein 6.0 - 8.3 g/dL 7.3 7.1 7.1  Albumin 3.5 - 5.2 g/dL 3.8 - 4.0  AST 0 - 37 U/L 33 18 15  ALT 0 - 35 U/L 32 16 14  Alk Phosphatase 39 - 117 U/L 73 - 74  Total Bilirubin 0.2 - 1.2 mg/dL 1.2 9.3(T) 0.8  Bilirubin, Direct 0.0 - 0.3 mg/dL - - -    Lab Results  Component Value Date/Time   TSH 1.51 10/25/2020 09:25 AM   TSH 0.86 10/23/2019 10:52 AM    CBC Latest Ref Rng & Units 10/25/2020 10/23/2019 10/02/2018  WBC 4.0 - 10.5 K/uL 9.7 10.7 9.4  Hemoglobin 12.0 - 15.0 g/dL 65.0 52.5 79.8  Hematocrit 36.0 - 46.0 % 41.4 43.5 42.4  Platelets 150.0 - 400.0 K/uL 331.0 354 326.0    No results found for: VD25OH  Clinical ASCVD: No  The ASCVD Risk score (Arnett DK, et al., 2019)  failed to calculate for the following reasons:   The 2019 ASCVD risk score is only valid for ages 52 to 15    Depression screen PHQ 2/9 01/26/2021 04/26/2020 04/06/2019  Decreased Interest 0 0 0  Down, Depressed, Hopeless 0 0 0  PHQ - 2 Score 0 0 0  Altered sleeping 1 - -  Tired, decreased energy 1 - -  Change in appetite 1 - -  Feeling bad or failure about yourself  0 - -  Trouble concentrating 0 - -  Moving slowly or fidgety/restless 0 - -  Suicidal thoughts 0 - -  PHQ-9 Score 3 - -  Difficult doing work/chores Not difficult at all - -     Social History   Tobacco Use  Smoking Status Never  Smokeless Tobacco Never   BP  Readings from Last 3 Encounters:  01/26/21 (!) 142/70  10/25/20 120/60  10/23/19 118/60   Pulse Readings from Last 3 Encounters:  01/26/21 94  10/25/20 77  10/23/19 77   Wt Readings from Last 3 Encounters:  01/26/21 222 lb (100.7 kg)  10/25/20 222 lb (100.7 kg)  11/16/19 221 lb (100.2 kg)   BMI Readings from Last 3 Encounters:  01/26/21 36.94 kg/m  10/25/20 36.94 kg/m  11/16/19 35.67 kg/m    Assessment/Interventions: Review of patient past medical history, allergies, medications, health status, including review of consultants reports, laboratory and other test data, was performed as part of comprehensive evaluation and provision of chronic care management services.   SDOH:  (Social Determinants of Health) assessments and interventions performed: Yes   SDOH Screenings   Alcohol Screen: Low Risk    Last Alcohol Screening Score (AUDIT): 0  Depression (PHQ2-9): Low Risk    PHQ-2 Score: 3  Financial Resource Strain: Low Risk    Difficulty of Paying Living Expenses: Not hard at all  Food Insecurity: No Food Insecurity   Worried About Charity fundraiser in the Last Year: Never true   Ran Out of Food in the Last Year: Never true  Housing: Low Risk    Last Housing Risk Score: 0  Physical Activity: Inactive   Days of Exercise per Week: 0 days   Minutes of Exercise per Session: 0 min  Social Connections: Moderately Integrated   Frequency of Communication with Friends and Family: More than three times a week   Frequency of Social Gatherings with Friends and Family: More than three times a week   Attends Religious Services: More than 4 times per year   Active Member of [employer] or Organizations: Yes   Attends Archivist Meetings: More than 4 times per year   Marital Status: Widowed  Stress: No Stress Concern Present   Feeling of Stress : Not at all  Tobacco Use: Low Risk    Smoking Tobacco Use: Never   Smokeless Tobacco Use: Never   Passive Exposure: Not on file   Transportation Needs: No Transportation Needs   Lack of Transportation (Medical): No   Lack of Transportation (Non-Medical): No    CCM Care Plan  Allergies  Allergen Reactions   Ciprofloxacin     Made her feel like she was [place],nausea   Macrobid [Nitrofurantoin Macrocrystal]     Made her feel like she was [place],nausea   Sulfacetamide Sodium Itching and Rash   Sulfamethoxazole-Trimethoprim Itching and Rash    Medications Reviewed Today     Reviewed by Viona Gilmore, [facility] (Pharmacist) on 03/22/21 at 1418  Med List Status: <None>   Medication Order Taking? Sig Documenting Provider Last Dose Status Informant  allopurinol (ZYLOPRIM) 100 MG tablet [id number] No TAKE 1 TABLET(100 MG) BY MOUTH DAILY Nafziger, Tommi Rumps, NP Taking Active   amLODipine (NORVASC) 2.5 MG tablet [id number] No TAKE 1 TABLET(2.5 MG) BY MOUTH DAILY Nafziger, Tommi Rumps, NP Taking Active   calcium carbonate (CALCIUM 600) 600 MG TABS tablet [id number] No Take 600 mg by mouth 2 (two) times daily with a meal. [provider] Taking Active   latanoprost (XALATAN) 0.005 % ophthalmic solution [id number] No INSTILL 1 DROP IN BOTH EYES AT BEDTIME Dorena Cookey, MD Taking Active   losartan-hydrochlorothiazide [facility]) 100-12.5 MG tablet [id number] No Take 1 tablet by mouth daily. Nafziger, Tommi Rumps, NP Taking Active   losartan-hydrochlorothiazide (HYZAAR) 100-25 MG tablet [id number]  Take 1 tablet by mouth daily. Nafziger, Tommi Rumps, NP  Active   meloxicam (MOBIC) 7.5 MG tablet [id number] No TAKE 1 TABLET(7.5 MG) BY MOUTH DAILY Nafziger, Tommi Rumps, NP Taking Active   potassium chloride SA (KLOR-CON) 20 MEQ tablet [id number] No TAKE 1 TABLET(20 MEQ) BY MOUTH DAILY Nafziger, Tommi Rumps, NP Taking Active   Vitamin D, Cholecalciferol, 25 MCG (1000 UT) CAPS [id number] No Take by mouth daily. [provider] Taking Active             Patient Active Problem List   Diagnosis Date Noted   Unilateral primary osteoarthritis,  left knee 11/16/2019   Unilateral primary osteoarthritis, right knee 11/16/2019   Impingement syndrome of right shoulder 03/13/2018   Venous (peripheral) insufficiency 09/06/2009   GOUT 06/04/2007   DVT 03/10/2007   PHLEBITIS&THROMBOPHLEBITIS LOWER EXTREM UNSPEC 03/07/2007   Essential hypertension 11/28/2006   Osteoarthritis 11/28/2006   History of colonic polyps 11/28/2006   OBESITY NOS 09/27/2006   ESOPHAGITIS NOS 09/27/2006   STATE, SYMPTOMATIC MENOPAUSE/FEM CLIMACTERIC 09/27/2006    Immunization History  Administered Date(s) Administered   Fluad Quad(high Dose 65+) 11/22/2018   Influenza Split 11/29/2011   Influenza Whole 11/18/2007, 12/05/2009, 11/07/2010   Influenza, High Dose Seasonal PF 12/10/2012, 12/23/2014, 12/14/2015, 12/04/2016, 11/29/2017   Influenza,inj,Quad PF,6+ Mos 12/07/2013   Influenza-Unspecified 11/25/2019   PFIZER(Purple Top)SARS-COV-2 Vaccination 04/03/2019, 04/28/2019, 12/02/2019, 09/16/2020   Pneumococcal Conjugate-13 02/02/2013   Pneumococcal Polysaccharide-23 07/29/2008   Td 07/29/2008   Patient reports she has had some issues with her insurance as she was switched to a plan that didn't cover all of her doctors so she will be switching back to [employer] in Feb.   Patient reports some SOB when she is going out and in the grocery store, particularly when she wears a mask. She also notes some leg swelling sometimes and wears compression stockings which have seemed to help.  She lives with her son who is in his 58s but is mentally disabled. He also has diabetes so they are working together to cut down on junk food.  Conditions to be addressed/monitored:  Hypertension, Osteoarthritis, Overactive Bladder, Gout, and Glaucoma  Conditions addressed this visit: Hypertension, diabetes, overactive bladder  Care Plan : [place]  Updates made by Viona Gilmore, [place] since 03/22/2021 12:00 AM     Problem: Problem: Hypertension, Osteoarthritis,  Overactive Bladder, Gout, and Glaucoma      Long-Range Goal: Patient-Specific Goal   Start Date: 09/21/2020  Expected End Date: 09/21/2021  Recent Progress: On track  Priority: High  Note:   Current Barriers:  Unable to independently monitor therapeutic efficacy  Pharmacist Clinical Goal(s):  Patient will achieve adherence to monitoring guidelines  and medication adherence to achieve therapeutic efficacy maintain control of blood pressure as evidenced by home blood pressure monitoring  through collaboration with PharmD and provider.   Interventions: 1:1 collaboration with Dorothyann Peng, NP regarding development and update of comprehensive plan of care as evidenced by provider attestation and co-signature Inter-disciplinary care team collaboration (see longitudinal plan of care) Comprehensive medication review performed; medication list updated in electronic medical record  Hypertension (BP goal <140/90) -Controlled -Current treatment: Amlodipine 2.5 mg 1 tablet daily - Appropriate, Effective, Safe, Accessible Losartan-hydrochlorothiazide 100-12.5 mg 1 tablet daily - Appropriate, Effective, Safe, Accessible -Medications previously tried: none  -Current home readings: 139/63; checking 2-3 times a week; arm cuff -Current dietary habits: has cut back on salt some but does eat out -Current exercise habits: walking some but not consistently -Denies hypotensive/hypertensive symptoms -Educated on Exercise goal of 150 minutes per week; Importance of home blood pressure monitoring; Proper BP monitoring technique; -Counseled to monitor BP at home weekly, document, and provide log at future appointments -Counseled on diet and exercise extensively Recommended to continue current medication Recommended switching to lower sodium salt and purchasing an arm BP cuff  Low potassium (Goal: 3.5-5) -Controlled -Current treatment  Potassium chloride 20 mEq 1 tablet daily - Appropriate, Effective,  Safe, Accessible -Medications previously tried: none  -Recommended to continue current medication  Gout (Goal: prevent flare ups and maintain uric acid < 6) -Controlled -Current treatment  Allopurinol 100 mg 1 tablet daily - Appropriate, Effective, Safe, Accessible -Medications previously tried: none  -Counseled on foods/drinks that can increase risk of gout flare ups such as alcohol, fruit juices, sodas, some seafood, red meat and organ meat  Glaucoma (Goal: lower intraocular pressure) -Controlled -Current treatment  Latanoprost 0.005% 1 drop in both eyes at bedtime - Appropriate, Effective, Safe, Accessible -Medications previously tried: none  -Recommended to continue current medication  Overactive bladder (Goal: minimize symptoms) -Uncontrolled -Current treatment  None -Medications previously tried: Detrol LA (dizziness), Myrbetriq (cost) -Recommended to continue current medication Recommended restarting Myrbetriq.  Osteoarthritis (Goal: minimize pain) -Controlled -Current treatment  Meloxicam 7.5 mg 1 tablet daily - Appropriate, Effective, Query Safe, Accessible Voltaren gel as needed - Appropriate, Effective, Safe, Accessible -Medications previously tried: n/a  -Counseled on limiting use of NSAIDs due to risk of stomach bleeding and increased BP Recommended use of Tylenol and topicals such as Voltaren gel or capsaicin cream to minimize side effects  Diabetes (A1c goal <7%) -Controlled -Current medications: No medications -Medications previously tried: none  -Current home glucose readings fasting glucose: does not need to check post prandial glucose: does not need to check -Denies hypoglycemic/hyperglycemic symptoms -Current meal patterns:  breakfast: did not discuss  lunch: did not discuss  dinner: did not discuss snacks: cut out a lot of sweets drinks: n/a -Current exercise: minimal -Educated on A1c and blood sugar goals; Carbohydrate counting and/or plate  method -Counseled to check feet daily and get yearly eye exams -Counseled on diet and exercise extensively   Health Maintenance -Vaccine gaps: tetanus, shingrix (cost) -Current therapy:  No medications -Educated on Cost vs benefit of each product must be carefully weighed by individual consumer -Patient is satisfied with current therapy and denies issues -Counseled on recommended amounts of calcium (1200 mg) and vitamin D (1000 units) daily to support bone health Recommended supplementation with calcium citrate 600 mg and vitamin D 1000 units daily.  Patient Goals/Self-Care Activities Patient will:  - take medications as prescribed check blood pressure weekly, document, and provide at future appointments  target a minimum of 150 minutes of moderate intensity exercise weekly  Follow Up Plan: The care management team will reach out to the patient again over the next 30 days.         Medication Assistance: None required.  Patient affirms current coverage meets needs.  Compliance/Adherence/Medication fill history: Care Gaps: Shingrix, tetanus, COVID booster, influenza, mammogram Last BP - 142/70 on 01/26/2021  Star-Rating Drugs: Losartan HCTZ 100/12.5mg  - Last filled 01/26/2021 90DS at [facility]  Patient's preferred pharmacy is:  [facility], [place] [address] [place] [address] Phone: [phone] Fax: [phone]   Uses pill box? No - takes them all at the same time Pt endorses 100% compliance  We discussed: Benefits of medication synchronization, packaging and delivery as well as enhanced pharmacist oversight with [employer]. Patient decided to: Continue current medication management strategy  Care Plan and Follow Up Patient Decision:  Patient agrees to Care Plan and Follow-up.  Plan: The care management team will reach out to the patient again over the next 30 days.  Jeni Salles,  PharmD, [place] Pharmacist [facility]

## 2021-03-28 DIAGNOSIS — M1990 Unspecified osteoarthritis, unspecified site: Secondary | ICD-10-CM

## 2021-03-28 DIAGNOSIS — I10 Essential (primary) hypertension: Secondary | ICD-10-CM | POA: Diagnosis not present

## 2021-03-31 ENCOUNTER — Telehealth: Payer: Self-pay | Admitting: Pharmacist

## 2021-03-31 ENCOUNTER — Other Ambulatory Visit: Payer: Self-pay | Admitting: Adult Health

## 2021-03-31 DIAGNOSIS — I10 Essential (primary) hypertension: Secondary | ICD-10-CM

## 2021-03-31 NOTE — Telephone Encounter (Signed)
Called the pharmacy to inquire about the price of Myrbetiq.  Called the patient and had to leave a voicemail. Requested a call back about the Myrbetriq.

## 2021-04-04 ENCOUNTER — Other Ambulatory Visit: Payer: Self-pay | Admitting: Surgery

## 2021-04-04 DIAGNOSIS — Z09 Encounter for follow-up examination after completed treatment for conditions other than malignant neoplasm: Secondary | ICD-10-CM

## 2021-04-24 DIAGNOSIS — R928 Other abnormal and inconclusive findings on diagnostic imaging of breast: Secondary | ICD-10-CM | POA: Diagnosis not present

## 2021-04-24 DIAGNOSIS — R922 Inconclusive mammogram: Secondary | ICD-10-CM | POA: Diagnosis not present

## 2021-04-24 LAB — HM MAMMOGRAPHY: HM Mammogram: ABNORMAL — AB (ref 0–4)

## 2021-04-26 ENCOUNTER — Encounter: Payer: Self-pay | Admitting: Adult Health

## 2021-04-26 ENCOUNTER — Other Ambulatory Visit: Payer: Self-pay

## 2021-04-27 ENCOUNTER — Ambulatory Visit (INDEPENDENT_AMBULATORY_CARE_PROVIDER_SITE_OTHER): Payer: PPO

## 2021-04-27 VITALS — Ht 65.0 in | Wt 222.0 lb

## 2021-04-27 DIAGNOSIS — Z0000 Encounter for general adult medical examination without abnormal findings: Secondary | ICD-10-CM

## 2021-04-27 NOTE — Progress Notes (Signed)
Subjective:   Kristina Allison is a 86 y.o. female who presents for Medicare Annual (Subsequent) preventive examination.  Review of Systems    Virtual Visit via Telephone Note  I connected with  Kristina Allison on 04/27/21 at  9:00 AM EST by telephone and verified that I am speaking with the correct person using two identifiers.  Location: Patient: Home Provider: Office Persons participating in the virtual visit: patient/Nurse Health Advisor   I discussed the limitations, risks, security and privacy concerns of performing an evaluation and management service by telephone and the availability of in person appointments. The patient expressed understanding and agreed to proceed.  Interactive audio and video telecommunications were attempted between this nurse and patient, however failed, due to patient having technical difficulties OR patient did not have access to video capability.  We continued and completed visit with audio only.  Some vital signs may be absent or patient reported.   Kristina Peaches, LPN  Cardiac Risk Factors include: advanced age (>23men, >73 women);hypertension     Objective:    Today's Vitals   04/27/21 0904  Weight: 222 lb (100.7 kg)  Height: 5\' 5"  (1.651 m)   Body mass index is 36.94 kg/m.  Advanced Directives 04/27/2021 04/26/2020 04/06/2019  Does Patient Have a Medical Advance Directive? Yes Yes Yes  Type of Paramedic of [place];Living will [place];Living will [place];Living will  Does patient want to make changes to medical advance directive? No - Patient declined No - Patient declined No - Patient declined  Copy of [place] in Chart? No - copy requested No - copy requested No - copy requested    Current Medications (verified) Outpatient Encounter Medications as of 04/27/2021  Medication Sig   allopurinol (ZYLOPRIM) 100 MG tablet TAKE 1 TABLET(100 MG) BY MOUTH  DAILY   amLODipine (NORVASC) 2.5 MG tablet TAKE 1 TABLET(2.5 MG) BY MOUTH DAILY   calcium carbonate (CALCIUM 600) 600 MG TABS tablet Take 600 mg by mouth 2 (two) times daily with a meal.   latanoprost (XALATAN) 0.005 % ophthalmic solution INSTILL 1 DROP IN BOTH EYES AT BEDTIME   losartan-hydrochlorothiazide (HYZAAR) 100-12.5 MG tablet TAKE 1 TABLET BY MOUTH DAILY   losartan-hydrochlorothiazide (HYZAAR) 100-25 MG tablet Take 1 tablet by mouth daily.   meloxicam (MOBIC) 7.5 MG tablet TAKE 1 TABLET(7.5 MG) BY MOUTH DAILY   potassium chloride SA (KLOR-CON) 20 MEQ tablet TAKE 1 TABLET(20 MEQ) BY MOUTH DAILY   Vitamin D, Cholecalciferol, 25 MCG (1000 UT) CAPS Take by mouth daily.   No facility-administered encounter medications on file as of 04/27/2021.    Allergies (verified) Ciprofloxacin, Macrobid [nitrofurantoin macrocrystal], Sulfacetamide sodium, and Sulfamethoxazole-trimethoprim   History: Past Medical History:  Diagnosis Date   COLONIC POLYPS, HX OF 11/28/2006   DVT 03/10/2007   Edema 02/21/2007   ESOPHAGITIS NOS 09/27/2006   GOUT 06/04/2007   HIP PAIN, LEFT 01/30/2007   HYPERTENSION 11/28/2006   OBESITY NOS 09/27/2006   OSTEOARTHRITIS 11/28/2006   OSTEOARTHROSIS, GENERALIZED, UNSPC SITE 09/27/2006   PHLEBITIS&THROMBOPHLEBITIS LOWER EXTREM UNSPEC 03/07/2007   STATE, SYMPTOMATIC MENOPAUSE/FEM CLIMACTERIC 09/27/2006   VENOUS INSUFFICIENCY, CHRONIC, RIGHT LEG 09/06/2009   Past Surgical History:  Procedure Laterality Date   ABDOMINAL HYSTERECTOMY     History reviewed. No pertinent family history. Social History   Socioeconomic History   Marital status: Widowed    Spouse name: Not on file   Number of children: 7   Years  of education: 12   Highest education level: High school graduate  Occupational History   Occupation: retired  Tobacco Use   Smoking status: Never   Smokeless tobacco: Never  Substance and Sexual Activity   Alcohol use: No   Drug use: No   Sexual activity: Not on file   Other Topics Concern   Not on file  Social History Narrative   Widowed   [place] 2 : son lives with her   Likes word searches, reading, television   Social Determinants of Health   Financial Resource Strain: Low Risk    Difficulty of Paying Living Expenses: Not hard at all  Food Insecurity: No Food Insecurity   Worried About Charity fundraiser in the Last Year: Never true   Arboriculturist in the Last Year: Never true  Transportation Needs: No Transportation Needs   Lack of Transportation (Medical): No   Lack of Transportation (Non-Medical): No  Physical Activity: Inactive   Days of Exercise per Week: 0 days   Minutes of Exercise per Session: 0 min  Stress: No Stress Concern Present   Feeling of Stress : Not at all  Social Connections: Moderately Integrated   Frequency of Communication with Friends and Family: More than three times a week   Frequency of Social Gatherings with Friends and Family: More than three times a week   Attends Religious Services: More than 4 times per year   Active Member of [employer] or Organizations: Yes   Attends Archivist Meetings: More than 4 times per year   Marital Status: Widowed     Clinical Intake:  Pre-visit preparation completed: Yes   Diabetic?  No  Activities of Daily Living In your present state of health, do you have any difficulty performing the following activities: 04/27/2021  Hearing? N  Vision? N  Difficulty concentrating or making decisions? N  Walking or climbing stairs? N  Dressing or bathing? N  Doing errands, shopping? N  Preparing Food and eating ? N  Using the Toilet? N  In the past six months, have you accidently leaked urine? Y  Comment Followed by PCP  Do you have problems with loss of bowel control? N  Managing your Medications? N  Managing your Finances? N  Housekeeping or managing your Housekeeping? N  Some recent data might be hidden    Patient Care Team: Dorothyann Peng, NP as PCP - General (Family  Medicine) Viona Gilmore, [facility] as Pharmacist (Pharmacist)  Indicate any recent Medical Services you may have received from other than [facility] providers in the past year (date may be approximate).     Assessment:   This is a routine wellness examination for Kristina Allison.  Hearing/Vision screen Hearing Screening - Comments:: No difficulty hearing Vision Screening - Comments:: Wears glasses. Followed by [facility]  Dietary issues and exercise activities discussed: Exercise limited by: None identified   Goals Addressed               This Visit's Progress     Exercise 3x per week (30 min per time) (pt-stated)        Try to start walking outside on days when weather is cooperative.       Depression Screen PHQ 2/9 Scores 04/27/2021 01/26/2021 04/26/2020 04/06/2019 10/02/2018 07/27/2015 05/31/2014  PHQ - 2 Score 0 0 0 0 0 0 0  PHQ- 9 Score - 3 - - - - -    Fall Risk Fall Risk  04/27/2021  01/26/2021 04/26/2020 04/06/2019 10/02/2018  Falls in the past year? 0 1 0 0 0  Number falls in past yr: 0 0 0 - -  Injury with Fall? 0 0 0 - -  Risk for fall due to : No Fall Risks - No Fall Risks Medication side effect -  Follow up - - Falls evaluation completed;Falls prevention discussed Falls evaluation completed;Education provided;Falls prevention discussed -    FALL RISK PREVENTION PERTAINING TO THE HOME:  Any stairs in or around the home? No  If so, are there any without handrails? No  Home free of loose throw rugs in walkways, pet beds, electrical cords, etc? Yes  Adequate lighting in your home to reduce risk of falls? Yes   ASSISTIVE DEVICES UTILIZED TO PREVENT FALLS:  Life alert? Yes  Use of a cane, walker or w/c? No  Grab bars in the bathroom? Yes  Shower chair or bench in shower? Yes  Elevated toilet seat or a handicapped toilet? Yes   TIMED UP AND GO:  Was the test performed? No . Audio Visit  Cognitive Function:     6CIT Screen 04/27/2021 04/06/2019  What Year? 0 points 0 points  What  month? 0 points 0 points  What time? 0 points 0 points  Count back from 20 0 points 0 points  Months in reverse 0 points 0 points  Repeat phrase 0 points 0 points  Total Score 0 0    Immunizations Immunization History  Administered Date(s) Administered   Fluad Quad(high Dose 65+) 11/22/2018, 09/16/2020   Influenza Split 11/29/2011   Influenza Whole 11/18/2007, 12/05/2009, 11/07/2010   Influenza, High Dose Seasonal PF 12/10/2012, 12/23/2014, 12/14/2015, 12/04/2016, 11/29/2017   Influenza,inj,Quad PF,6+ Mos 12/07/2013   Influenza-Unspecified 11/25/2019   PFIZER(Purple Top)SARS-COV-2 Vaccination 04/03/2019, 04/28/2019, 12/02/2019, 09/16/2020   Pneumococcal Conjugate-13 02/02/2013   Pneumococcal Polysaccharide-23 07/29/2008   Td 07/29/2008    TDAP status: Due, Education has been provided regarding the importance of this vaccine. Advised may receive this vaccine at local pharmacy or Health Dept. Aware to provide a copy of the vaccination record if obtained from local pharmacy or Health Dept. Verbalized acceptance and understanding.  Flu Vaccine status: Up to date  Pneumococcal vaccine status: Up to date  Covid-19 vaccine status: Completed vaccines  Qualifies for Shingles Vaccine? Yes   Zostavax completed No   Shingrix Completed?: No.    Education has been provided regarding the importance of this vaccine. Patient has been advised to call insurance company to determine out of pocket expense if they have not yet received this vaccine. Advised may also receive vaccine at local pharmacy or Health Dept. Verbalized acceptance and understanding.  Screening Tests Health Maintenance  Topic Date Due   INFLUENZA VACCINE  09/26/2020   COVID-19 Vaccine (5 - Booster for Pfizer series) 05/13/2021 (Originally 11/11/2020)   Zoster Vaccines- Shingrix (1 of 2) 07/28/2021 (Originally 03/06/1954)   TETANUS/TDAP  04/28/2022 (Originally 07/30/2018)   MAMMOGRAM  04/24/2022   Pneumonia Vaccine 36+ Years old   Completed   DEXA SCAN  Completed   HPV VACCINES  Aged Out    Health Maintenance  Health Maintenance Due  Topic Date Due   INFLUENZA VACCINE  09/26/2020    Colorectal cancer screening: No longer required.   Mammogram status: No longer required due to Age.  Bone Density status: Completed 06/07/20. Results reflect: Bone density results: OSTEOPOROSIS. Repeat every 2 years.  Lung Cancer Screening: (Low Dose CT Chest recommended if Age 96-80 years, 30 pack-year currently  smoking OR have quit w/in 15years.) does not qualify.     Additional Screening:  Hepatitis C Screening: does not qualify; Completed   Vision Screening: Recommended annual ophthalmology exams for early detection of glaucoma and other disorders of the eye. Is the patient up to date with their annual eye exam?  Yes  Who is the provider or what is the name of the office in which the patient attends annual eye exams? [facility] If pt is not established with a provider, would they like to be referred to a provider to establish care? No .   Dental Screening: Recommended annual dental exams for proper oral hygiene  Community Resource Referral / Chronic Care Management:  CRR required this visit?  No   CCM required this visit?  No      Plan:     I have personally reviewed and noted the following in the patients chart:   Medical and social history Use of alcohol, tobacco or illicit drugs  Current medications and supplements including opioid prescriptions.  Functional ability and status Nutritional status Physical activity Advanced directives List of other physicians Hospitalizations, surgeries, and ER visits in previous 12 months Vitals Screenings to include cognitive, depression, and falls Referrals and appointments  In addition, I have reviewed and discussed with patient certain preventive protocols, quality metrics, and best practice recommendations. A written personalized care plan for preventive  services as well as general preventive health recommendations were provided to patient.     Kristina Peaches, LPN   05/04/298   Nurse Notes: None

## 2021-04-27 NOTE — Patient Instructions (Addendum)
Kristina Allison , Thank you for taking time to come for your Medicare Wellness Visit. I appreciate your ongoing commitment to your health goals. Please review the following plan we discussed and let me know if I can assist you in the future.   These are the goals we discussed:  Goals       Exercise 3x per week (30 min per time) (pt-stated)      Try to start walking outside on days when weather is cooperative.      Patient Stated      I want to maintain my health.      Track and Manage My Blood Pressure-Hypertension      Timeframe:  Short-Term Goal Priority:  Medium Start Date:                             Expected End Date:                       Follow Up Date 9/31/22    - check blood pressure weekly - choose a place to take my blood pressure (home, clinic or office, retail store) - write blood pressure results in a log or diary    Why is this important?   You won't feel high blood pressure, but it can still hurt your blood vessels.  High blood pressure can cause heart or kidney problems. It can also cause a stroke.  Making lifestyle changes like losing a little weight or eating less salt will help.  Checking your blood pressure at home and at different times of the day can help to control blood pressure.  If the doctor prescribes medicine remember to take it the way the doctor ordered.  Call the office if you cannot afford the medicine or if there are questions about it.     Notes:         This is a list of the screening recommended for you and due dates:  Health Maintenance  Topic Date Due   Flu Shot  09/26/2020   COVID-19 Vaccine (5 - Booster for Pfizer series) 05/13/2021*   Zoster (Shingles) Vaccine (1 of 2) 07/28/2021*   Tetanus Vaccine  04/28/2022*   Mammogram  04/24/2022   Pneumonia Vaccine  Completed   DEXA scan (bone density measurement)  Completed   HPV Vaccine  Aged Out  *Topic was postponed. The date shown is not the original due date.    Advanced directives:  Yes Patient will bring copy  Conditions/risks identified: None  Next appointment: Follow up in one year for your annual wellness visit    Preventive Care 65 Years and Older, Female Preventive care refers to lifestyle choices and visits with your health care provider that can promote health and wellness. What does preventive care include? A yearly physical exam. This is also called an annual well check. Dental exams once or twice a year. Routine eye exams. Ask your health care provider how often you should have your eyes checked. Personal lifestyle choices, including: Daily care of your teeth and gums. Regular physical activity. Eating a healthy diet. Avoiding tobacco and drug use. Limiting alcohol use. Practicing safe sex. Taking low-dose aspirin every day. Taking vitamin and mineral supplements as recommended by your health care provider. What happens during an annual well check? The services and screenings done by your health care provider during your annual well check will depend on your age, overall health,  lifestyle risk factors, and family history of disease. Counseling  Your health care provider may ask you questions about your: Alcohol use. Tobacco use. Drug use. Emotional well-being. Home and relationship well-being. Sexual activity. Eating habits. History of falls. Memory and ability to understand (cognition). Work and work Statistician. Reproductive health. Screening  You may have the following tests or measurements: Height, weight, and BMI. Blood pressure. Lipid and cholesterol levels. These may be checked every 5 years, or more frequently if you are over 82 years old. Skin check. Lung cancer screening. You may have this screening every year starting at age 33 if you have a 30-pack-year history of smoking and currently smoke or have quit within the past 15 years. Fecal occult blood test (FOBT) of the stool. You may have this test every year starting at age  45. Flexible sigmoidoscopy or colonoscopy. You may have a sigmoidoscopy every 5 years or a colonoscopy every 10 years starting at age 1. Hepatitis C blood test. Hepatitis B blood test. Sexually transmitted disease (STD) testing. Diabetes screening. This is done by checking your blood sugar (glucose) after you have not eaten for a while (fasting). You may have this done every 1-3 years. Bone density scan. This is done to screen for osteoporosis. You may have this done starting at age 58. Mammogram. This may be done every 1-2 years. Talk to your health care provider about how often you should have regular mammograms. Talk with your health care provider about your test results, treatment options, and if necessary, the need for more tests. Vaccines  Your health care provider may recommend certain vaccines, such as: Influenza vaccine. This is recommended every year. Tetanus, diphtheria, and acellular pertussis (Tdap, Td) vaccine. You may need a Td booster every 10 years. Zoster vaccine. You may need this after age 33. Pneumococcal 13-valent conjugate (PCV13) vaccine. One dose is recommended after age 64. Pneumococcal polysaccharide (PPSV23) vaccine. One dose is recommended after age 80. Talk to your health care provider about which screenings and vaccines you need and how often you need them. This information is not intended to replace advice given to you by your health care provider. Make sure you discuss any questions you have with your health care provider. Document Released: 03/11/2015 Document Revised: 11/02/2015 Document Reviewed: 12/14/2014 Elsevier Interactive Patient Education  2017 [place] Prevention in the Home Falls can cause injuries. They can happen to people of all ages. There are many things you can do to make your home safe and to help prevent falls. What can I do on the outside of my home? Regularly fix the edges of walkways and driveways and fix any cracks. Remove  anything that might make you trip as you walk through a door, such as a raised step or threshold. Trim any bushes or trees on the path to your home. Use bright outdoor lighting. Clear any walking paths of anything that might make someone trip, such as rocks or tools. Regularly check to see if handrails are loose or broken. Make sure that both sides of any steps have handrails. Any raised decks and porches should have guardrails on the edges. Have any leaves, snow, or ice cleared regularly. Use sand or salt on walking paths during winter. Clean up any spills in your garage right away. This includes oil or grease spills. What can I do in the bathroom? Use night lights. Install grab bars by the toilet and in the tub and shower. Do not use towel bars as  grab bars. Use non-skid mats or decals in the tub or shower. If you need to sit down in the shower, use a plastic, non-slip stool. Keep the floor dry. Clean up any water that spills on the floor as soon as it happens. Remove soap buildup in the tub or shower regularly. Attach bath mats securely with double-sided non-slip rug tape. Do not have throw rugs and other things on the floor that can make you trip. What can I do in the bedroom? Use night lights. Make sure that you have a light by your bed that is easy to reach. Do not use any sheets or blankets that are too big for your bed. They should not hang down onto the floor. Have a firm chair that has side arms. You can use this for support while you get dressed. Do not have throw rugs and other things on the floor that can make you trip. What can I do in the kitchen? Clean up any spills right away. Avoid walking on wet floors. Keep items that you use a lot in easy-to-reach places. If you need to reach something above you, use a strong step stool that has a grab bar. Keep electrical cords out of the way. Do not use floor polish or wax that makes floors slippery. If you must use wax, use  non-skid floor wax. Do not have throw rugs and other things on the floor that can make you trip. What can I do with my stairs? Do not leave any items on the stairs. Make sure that there are handrails on both sides of the stairs and use them. Fix handrails that are broken or loose. Make sure that handrails are as long as the stairways. Check any carpeting to make sure that it is firmly attached to the stairs. Fix any carpet that is loose or worn. Avoid having throw rugs at the top or bottom of the stairs. If you do have throw rugs, attach them to the floor with carpet tape. Make sure that you have a light switch at the top of the stairs and the bottom of the stairs. If you do not have them, ask someone to add them for you. What else can I do to help prevent falls? Wear shoes that: Do not have high heels. Have rubber bottoms. Are comfortable and fit you well. Are closed at the toe. Do not wear sandals. If you use a stepladder: Make sure that it is fully opened. Do not climb a closed stepladder. Make sure that both sides of the stepladder are locked into place. Ask someone to hold it for you, if possible. Clearly mark and make sure that you can see: Any grab bars or handrails. First and last steps. Where the edge of each step is. Use tools that help you move around (mobility aids) if they are needed. These include: Canes. Walkers. Scooters. Crutches. Turn on the lights when you go into a dark area. Replace any light bulbs as soon as they burn out. Set up your furniture so you have a clear path. Avoid moving your furniture around. If any of your floors are uneven, fix them. If there are any pets around you, be aware of where they are. Review your medicines with your doctor. Some medicines can make you feel dizzy. This can increase your chance of falling. Ask your doctor what other things that you can do to help prevent falls. This information is not intended to replace advice given to  you by  your health care provider. Make sure you discuss any questions you have with your health care provider. Document Released: 12/09/2008 Document Revised: 07/21/2015 Document Reviewed: 03/19/2014 Elsevier Interactive Patient Education  2017 [employer].

## 2021-05-23 DIAGNOSIS — D241 Benign neoplasm of right breast: Secondary | ICD-10-CM | POA: Diagnosis not present

## 2021-05-30 ENCOUNTER — Other Ambulatory Visit: Payer: Self-pay | Admitting: Adult Health

## 2021-05-30 DIAGNOSIS — M159 Polyosteoarthritis, unspecified: Secondary | ICD-10-CM

## 2021-08-16 ENCOUNTER — Telehealth: Payer: Self-pay | Admitting: Pharmacist

## 2021-08-16 NOTE — Chronic Care Management (AMB) (Signed)
Chronic Care Management Pharmacy Assistant   Name: Kristina Allison  MRN: [id number] DOB: October 17, 1935  Reason for Encounter: Disease State / Hypertension Assessment Call   Conditions to be addressed/monitored: HTN  Recent office visits:  04/27/2021 Rolene Arbour LPN - Medicare annual wellness exam  Recent consult visits:  05/23/2021 Erroll Luna MD (Surgery) - Patient was seen for papilloma of right breast. No medication changes. No follow as needed.  04/24/2021 Emmit Pomfret (Diagnostic Radiology) - Patient was seen for Other abnormal and inconclusive findings on diagnostic imaging of breast and an additional issue. No other chart notes.  Hospital visits:  None  Medications: Outpatient Encounter Medications as of 08/16/2021  Medication Sig   allopurinol (ZYLOPRIM) 100 MG tablet TAKE 1 TABLET(100 MG) BY MOUTH DAILY   amLODipine (NORVASC) 2.5 MG tablet TAKE 1 TABLET(2.5 MG) BY MOUTH DAILY   calcium carbonate (CALCIUM 600) 600 MG TABS tablet Take 600 mg by mouth 2 (two) times daily with a meal.   latanoprost (XALATAN) 0.005 % ophthalmic solution INSTILL 1 DROP IN BOTH EYES AT BEDTIME   losartan-hydrochlorothiazide (HYZAAR) 100-12.5 MG tablet TAKE 1 TABLET BY MOUTH DAILY   losartan-hydrochlorothiazide (HYZAAR) 100-25 MG tablet Take 1 tablet by mouth daily.   meloxicam (MOBIC) 7.5 MG tablet TAKE 1 TABLET(7.5 MG) BY MOUTH DAILY   potassium chloride SA (KLOR-CON) 20 MEQ tablet TAKE 1 TABLET(20 MEQ) BY MOUTH DAILY   Vitamin D, Cholecalciferol, 25 MCG (1000 UT) CAPS Take by mouth daily.   No facility-administered encounter medications on file as of 08/16/2021.  Fill History: ALLOPURINOL '100MG'$  TABLETS 05/05/2021 90   AMLODIPINE BESYLATE 2.'5MG'$  TABLETS 04/30/2021 90   LATANOPROST 0.005% OPHTH SOLN 2.5ML 05/01/2021 75   LOSARTAN/HCTZ 100/'25MG'$  TABLETS 05/05/2021 90   meloxicam (MOBIC) tablet 05/30/2021 90   MYRBETRIQ 50 MG PO TB24 06/28/2021 30   POTASSIUM CL 20MEQ ER TABLETS  05/09/2021 90   Reviewed chart prior to disease state call. Spoke with patient regarding BP  Recent Office Vitals: BP Readings from Last 3 Encounters:  01/26/21 (!) 142/70  10/25/20 120/60  10/23/19 118/60   Pulse Readings from Last 3 Encounters:  01/26/21 94  10/25/20 77  10/23/19 77    Wt Readings from Last 3 Encounters:  04/27/21 222 lb (100.7 kg)  01/26/21 222 lb (100.7 kg)  10/25/20 222 lb (100.7 kg)     Kidney Function Lab Results  Component Value Date/Time   CREATININE 0.71 10/25/2020 09:25 AM   CREATININE 0.74 10/23/2019 10:52 AM   CREATININE 0.67 10/02/2018 10:57 AM   GFR 78.51 10/25/2020 09:25 AM   GFRNONAA 75 10/23/2019 10:52 AM   GFRAA 87 10/23/2019 10:52 AM       Latest Ref Rng & Units 10/25/2020    9:25 AM 10/23/2019   10:52 AM 10/02/2018   10:57 AM  BMP  Glucose 70 - 99 mg/dL 112  107  103   BUN 6 - 23 mg/dL '15  19  19   '$ Creatinine 0.40 - 1.20 mg/dL 0.71  0.74  0.67   BUN/Creat Ratio 6 - 22 (calc)  NOT APPLICABLE    Sodium 287 - 145 mEq/L 139  139  140   Potassium 3.5 - 5.1 mEq/L 3.5  4.3  4.4   Chloride 96 - 112 mEq/L 102  102  104   CO2 19 - 32 mEq/L '27  27  28   '$ Calcium 8.4 - 10.5 mg/dL 9.5  9.6  10.0     Current antihypertensive regimen:  Lisinopril HCTZ 100/12.5 mg daily  How often are you checking your Blood Pressure? Patient states she is checking her blood pressure about twice monthly.   Current home BP readings: 07/31/2021 138/64 and 07/17/2021 132/66  What recent interventions/DTPs have been made by any provider to improve Blood Pressure control since last CPP Visit: No recent interventions.   Any recent hospitalizations or ED visits since last visit with CPP? No recent hospital visits.   What diet changes have been made to improve Blood Pressure Control?  Patient follows no specific diet Breakfast  - patient will have coffee and a peanutbutter and jelly sandwich Lunch - patient will have a sandwich, soup or salad Dinner - patient  will have a meal with meat and vegetable Snack - patient will have chips and cookies  What exercise is being done to improve your Blood Pressure Control?  Patient does some walking and housework  Adherence Review: Is the patient currently on ACE/ARB medication? Yes Does the patient have >5 day gap between last estimated fill dates? No  Care Gaps: AWV - scheduled 04/30/2022 Last BP - 142/70 on 01/26/2021 Shingrix - never done Covid booster - overdue Tdap - postponed  Star Rating Drugs: Losartan HCTZ 100/12.5 mg - last filled 07/29/2021 90 DS at [facility] Pharmacist Assistant [phone]

## 2021-08-17 ENCOUNTER — Other Ambulatory Visit: Payer: Self-pay | Admitting: Adult Health

## 2021-08-17 DIAGNOSIS — I10 Essential (primary) hypertension: Secondary | ICD-10-CM

## 2021-08-17 DIAGNOSIS — E118 Type 2 diabetes mellitus with unspecified complications: Secondary | ICD-10-CM

## 2021-08-31 DIAGNOSIS — H401131 Primary open-angle glaucoma, bilateral, mild stage: Secondary | ICD-10-CM | POA: Diagnosis not present

## 2021-09-11 ENCOUNTER — Encounter: Payer: Self-pay | Admitting: Internal Medicine

## 2021-09-11 ENCOUNTER — Ambulatory Visit (INDEPENDENT_AMBULATORY_CARE_PROVIDER_SITE_OTHER): Payer: PPO | Admitting: Internal Medicine

## 2021-09-11 ENCOUNTER — Other Ambulatory Visit (HOSPITAL_COMMUNITY)
Admission: RE | Admit: 2021-09-11 | Discharge: 2021-09-11 | Disposition: A | Discharge status: Home / Self Care | Payer: PPO | Source: Ambulatory Visit | Attending: Internal Medicine | Admitting: Internal Medicine

## 2021-09-11 ENCOUNTER — Telehealth: Payer: Self-pay | Admitting: Adult Health

## 2021-09-11 VITALS — BP 112/68 | HR 82 | Temp 98.5°F | Ht 65.0 in | Wt 217.8 lb

## 2021-09-11 DIAGNOSIS — L304 Erythema intertrigo: Secondary | ICD-10-CM

## 2021-09-11 DIAGNOSIS — R300 Dysuria: Secondary | ICD-10-CM

## 2021-09-11 DIAGNOSIS — N898 Other specified noninflammatory disorders of vagina: Secondary | ICD-10-CM | POA: Insufficient documentation

## 2021-09-11 LAB — POCT URINALYSIS DIPSTICK
Bilirubin, UA: NEGATIVE
Blood, UA: NEGATIVE
Glucose, UA: NEGATIVE
Ketones, UA: NEGATIVE
Nitrite, UA: NEGATIVE
Protein, UA: NEGATIVE
Spec Grav, UA: 1.015 (ref 1.010–1.025)
Urobilinogen, UA: NEGATIVE E.U./dL — AB
pH, UA: 6 (ref 5.0–8.0)

## 2021-09-11 MED ORDER — FLUCONAZOLE 150 MG PO TABS: ORAL_TABLET | ORAL | 0 refills | Status: DC

## 2021-09-11 MED ORDER — NYSTATIN 100000 UNIT/GM EX POWD: 1.0000 | Freq: Three times a day (TID) | CUTANEOUS | 0 refills | Status: DC

## 2021-09-11 NOTE — Progress Notes (Signed)
Chief Complaint  Patient presents with   Vaginal Itching    Burns when pee  Started 2 weeks ago      HPI: Kristina Allison 86 y.o. come in for  outside  pantiline area  itches   in folds and then first drop of urine burns. So bad some worse no abdominal pain fever chills. Tried  monistat 3 helped a little but and cam e  back    No recent antibiotic.  Overall is itchy but painful to urinate ROS: See pertinent positives and negatives per HPI.  Past Medical History:  Diagnosis Date   COLONIC POLYPS, HX OF 11/28/2006   DVT 03/10/2007   Edema 02/21/2007   ESOPHAGITIS NOS 09/27/2006   GOUT 06/04/2007   HIP PAIN, LEFT 01/30/2007   HYPERTENSION 11/28/2006   OBESITY NOS 09/27/2006   OSTEOARTHRITIS 11/28/2006   OSTEOARTHROSIS, GENERALIZED, UNSPC SITE 09/27/2006   PHLEBITIS&THROMBOPHLEBITIS LOWER EXTREM UNSPEC 03/07/2007   STATE, SYMPTOMATIC MENOPAUSE/FEM CLIMACTERIC 09/27/2006   VENOUS INSUFFICIENCY, CHRONIC, RIGHT LEG 09/06/2009    No family history on file.  Social History   Socioeconomic History   Marital status: Widowed    Spouse name: Not on file   Number of children: 7   Years of education: 12   Highest education level: High school graduate  Occupational History   Occupation: retired  Tobacco Use   Smoking status: Never   Smokeless tobacco: Never  Substance and Sexual Activity   Alcohol use: No   Drug use: No   Sexual activity: Not on file  Other Topics Concern   Not on file  Social History Narrative   Widowed   [place] : son lives with her   Likes word searches, reading, television   Social Determinants of Health   Financial Resource Strain: Low Risk  (04/27/2021)   Overall Financial Resource Strain (CARDIA)    Difficulty of Paying Living Expenses: Not hard at all  Food Insecurity: No Food Insecurity (04/27/2021)   Hunger Vital Sign    Worried About Running Out of Food in the Last Year: Never true    [place] in the Last Year: Never true  Transportation Needs: No  Transportation Needs (04/27/2021)   PRAPARE - Hydrologist (Medical): No    Lack of Transportation (Non-Medical): No  Physical Activity: Inactive (04/27/2021)   Exercise Vital Sign    Days of Exercise per Week: 0 days    Minutes of Exercise per Session: 0 min  Stress: No Stress Concern Present (04/27/2021)   [place]    Feeling of Stress : Not at all  Social Connections: Moderately Integrated (04/27/2021)   Social Connection and Isolation Panel [NHANES]    Frequency of Communication with Friends and Family: More than three times a week    Frequency of Social Gatherings with Friends and Family: More than three times a week    Attends Religious Services: More than 4 times per year    Active Member of [employer] or Organizations: Yes    Attends Archivist Meetings: More than 4 times per year    Marital Status: Widowed    Outpatient Medications Prior to Visit  Medication Sig Dispense Refill   allopurinol (ZYLOPRIM) 100 MG tablet TAKE 1 TABLET(100 MG) BY MOUTH DAILY 90 tablet 3   amLODipine (NORVASC) 2.5 MG tablet TAKE 1 TABLET(2.5 MG) BY MOUTH DAILY 90 tablet 3   calcium carbonate (  CALCIUM 600) 600 MG TABS tablet Take 600 mg by mouth 2 (two) times daily with a meal.     latanoprost (XALATAN) 0.005 % ophthalmic solution INSTILL 1 DROP IN BOTH EYES AT BEDTIME 10 mL 1   losartan-hydrochlorothiazide (HYZAAR) 100-12.5 MG tablet TAKE 1 TABLET BY MOUTH DAILY 90 tablet 3   losartan-hydrochlorothiazide (HYZAAR) 100-25 MG tablet Take 1 tablet by mouth daily. 90 tablet 1   meloxicam (MOBIC) 7.5 MG tablet TAKE 1 TABLET(7.5 MG) BY MOUTH DAILY 90 tablet 1   potassium chloride SA (KLOR-CON) 20 MEQ tablet TAKE 1 TABLET(20 MEQ) BY MOUTH DAILY 90 tablet 3   Vitamin D, Cholecalciferol, 25 MCG (1000 UT) CAPS Take by mouth daily.     No facility-administered medications prior to visit.     EXAM:  BP 112/68 (BP  Location: Left Arm, Patient Position: Sitting, Cuff Size: Normal)   Pulse 82   Temp 98.5 F (36.9 C) (Oral)   Ht '5\' 5"'$  (1.651 m)   Wt 217 lb 12.8 oz (98.8 kg)   SpO2 98%   BMI 36.24 kg/m   Body mass index is 36.24 kg/m.  GENERAL: vitals reviewed and listed above, alert, oriented, appears well hydrated and in no acute distress HEENT: atraumatic, conjunctiva  clear, no obvious abnormalities on inspection of external nose and ears NECK: no obvious masses on inspection palpation  PSYCH: pleasant and cooperative, no obvious depression or anxiety Skin intertriginous rash in the suprapubic area in the groin with discrete. Borders  and som maceration  in perineum.  Diffuse erythema in the perineum without discharge external GU is irritated vaginal swab Aptima sent for wet prep. Urinalysis shows +3 leukocytes rest negative Lab Results  Component Value Date   WBC 9.7 10/25/2020   HGB 14.0 10/25/2020   HCT 41.4 10/25/2020   PLT 331.0 10/25/2020   GLUCOSE 112 (H) 10/25/2020   CHOL 142 10/25/2020   TRIG 72.0 10/25/2020   HDL 52.00 10/25/2020   LDLCALC 76 10/25/2020   ALT 32 10/25/2020   AST 33 10/25/2020   NA 139 10/25/2020   K 3.5 10/25/2020   CL 102 10/25/2020   CREATININE 0.71 10/25/2020   BUN 15 10/25/2020   CO2 27 10/25/2020   TSH 1.51 10/25/2020   INR 1.7 09/04/2007   HGBA1C 6.5 01/26/2021   BP Readings from Last 3 Encounters:  09/11/21 112/68  01/26/21 (!) 142/70  10/25/20 120/60    ASSESSMENT AND PLAN:  Discussed the following assessment and plan:  Intertrigo  Vaginal itching - Plan: POC Urinalysis Dipstick, Urine Culture, Urine Culture, Cervicovaginal ancillary only  Dysuria Appears to be a significant intertrigo and probably yeast vaginitis or at least external in the perineum no obvious bacterial infection like strep Topical nystatin topical Monistat and Diflucan 150 repeat in 3 days. Local care and let us know if not improving urine culture pending. -Patient  advised to return or notify health care team  if  new concerns arise.  Patient Instructions  This acts like bad yeast infection. In skin folds and vaginal.  Will send in diflucan  pills  Also use topical    monistat   in vaginal area  for 7 days   And can use  anti yeast  powder for creases  nystatin  powder  Let us know if not getting better  after rx.   Will let you know if urine shows uti.     Standley Brooking. Olamide Carattini M.D.

## 2021-09-11 NOTE — Telephone Encounter (Signed)
Pt scheduled to come in today @ 3:30 pm. She will see Dr. Regis Bill.  (IC)

## 2021-09-11 NOTE — Telephone Encounter (Signed)
Pt states she may have either a UTI or a yeast infection x 2 weeks.  Pt is using Monestat (OTC), but states its not working.  Is NP able to send her something to the pharmacy to treat this or should she make an appt ot come in?  Please advise.   [facility] [place], [address] AT [address] [place] Phone:  [phone]  Fax:  [phone]

## 2021-09-11 NOTE — Patient Instructions (Addendum)
This acts like bad yeast infection. In skin folds and vaginal.  Will send in diflucan  pills  Also use topical    monistat   in vaginal area  for 7 days   And can use  anti yeast  powder for creases  nystatin  powder  Let us know if not getting better  after rx.   Will let you know if urine shows uti.

## 2021-09-13 ENCOUNTER — Other Ambulatory Visit: Payer: Self-pay

## 2021-09-13 LAB — CERVICOVAGINAL ANCILLARY ONLY
Bacterial Vaginitis (gardnerella): POSITIVE — AB
Candida Glabrata: NEGATIVE
Candida Vaginitis: NEGATIVE
Comment: NEGATIVE
Comment: NEGATIVE
Comment: NEGATIVE
Comment: NEGATIVE
Trichomonas: NEGATIVE

## 2021-09-13 LAB — URINE CULTURE
MICRO NUMBER:: 13655759
SPECIMEN QUALITY:: ADEQUATE

## 2021-09-13 MED ORDER — AMOXICILLIN 500 MG PO CAPS: 500.0000 mg | ORAL_CAPSULE | Freq: Two times a day (BID) | ORAL | 0 refills | Status: AC

## 2021-09-13 NOTE — Progress Notes (Signed)
So surprisingly no yeast noted  on the vaginal swab  but some  bacteria  (doubt if causing sx ) however there is a strep  B bacteria in  your urine  that we will treat with a penicillin antibiotic . Please send in Amoxicillin 500 mg take 1 po bid for 5 days disp 10  and stay on the anti yeast treatment .

## 2021-09-19 ENCOUNTER — Telehealth: Payer: Self-pay | Admitting: Adult Health

## 2021-09-19 NOTE — Telephone Encounter (Signed)
Please advise 

## 2021-09-19 NOTE — Telephone Encounter (Signed)
Pt states she finished her treatment from her last visit still experiencing irritation and itching.. seeking guidance as to what to do next. [place] patient, saw Panosh for treatment

## 2021-09-19 NOTE — Telephone Encounter (Signed)
Last seen 09/11/21 w/ Dr. Regis Bill  Please advise

## 2021-10-26 ENCOUNTER — Ambulatory Visit (INDEPENDENT_AMBULATORY_CARE_PROVIDER_SITE_OTHER): Payer: PPO | Admitting: Adult Health

## 2021-10-26 ENCOUNTER — Encounter: Payer: Self-pay | Admitting: Adult Health

## 2021-10-26 VITALS — BP 120/68 | HR 87 | Temp 98.5°F | Ht 64.5 in | Wt 220.0 lb

## 2021-10-26 DIAGNOSIS — I10 Essential (primary) hypertension: Secondary | ICD-10-CM | POA: Diagnosis not present

## 2021-10-26 DIAGNOSIS — E118 Type 2 diabetes mellitus with unspecified complications: Secondary | ICD-10-CM | POA: Diagnosis not present

## 2021-10-26 DIAGNOSIS — M159 Polyosteoarthritis, unspecified: Secondary | ICD-10-CM | POA: Diagnosis not present

## 2021-10-26 DIAGNOSIS — M1000 Idiopathic gout, unspecified site: Secondary | ICD-10-CM

## 2021-10-26 DIAGNOSIS — N3281 Overactive bladder: Secondary | ICD-10-CM | POA: Diagnosis not present

## 2021-10-26 DIAGNOSIS — Z0000 Encounter for general adult medical examination without abnormal findings: Secondary | ICD-10-CM

## 2021-10-26 LAB — COMPREHENSIVE METABOLIC PANEL
ALT: 15 U/L (ref 0–35)
AST: 17 U/L (ref 0–37)
Albumin: 3.7 g/dL (ref 3.5–5.2)
Alkaline Phosphatase: 69 U/L (ref 39–117)
BUN: 19 mg/dL (ref 6–23)
CO2: 28 mEq/L (ref 19–32)
Calcium: 9.3 mg/dL (ref 8.4–10.5)
Chloride: 103 mEq/L (ref 96–112)
Creatinine, Ser: 0.74 mg/dL (ref 0.40–1.20)
GFR: 74.18 mL/min (ref >60.00)
Glucose, Bld: 146 mg/dL — ABNORMAL HIGH (ref 70–99)
Potassium: 4.1 mEq/L (ref 3.5–5.1)
Sodium: 141 mEq/L (ref 135–145)
Total Bilirubin: 1.1 mg/dL (ref 0.2–1.2)
Total Protein: 7 g/dL (ref 6.0–8.3)

## 2021-10-26 LAB — CBC WITH DIFFERENTIAL/PLATELET
Basophils Absolute: 0.1 10*3/uL (ref 0.0–0.1)
Basophils Relative: 1 % (ref 0.0–3.0)
Eosinophils Absolute: 0.6 10*3/uL (ref 0.0–0.7)
Eosinophils Relative: 6.2 % — ABNORMAL HIGH (ref 0.0–5.0)
HCT: 41.4 % (ref 36.0–46.0)
Hemoglobin: 13.6 g/dL (ref 12.0–15.0)
Lymphocytes Relative: 23.1 % (ref 12.0–46.0)
Lymphs Abs: 2.2 10*3/uL (ref 0.7–4.0)
MCHC: 33 g/dL (ref 30.0–36.0)
MCV: 90.2 fl (ref 78.0–100.0)
Monocytes Absolute: 0.5 10*3/uL (ref 0.1–1.0)
Monocytes Relative: 5.7 % (ref 3.0–12.0)
Neutro Abs: 6.1 10*3/uL (ref 1.4–7.7)
Neutrophils Relative %: 64 % (ref 43.0–77.0)
Platelets: 325 10*3/uL (ref 150.0–400.0)
RBC: 4.59 Mil/uL (ref 3.87–5.11)
RDW: 13.6 % (ref 11.5–15.5)
WBC: 9.5 10*3/uL (ref 4.0–10.5)

## 2021-10-26 LAB — LIPID PANEL
Cholesterol: 147 mg/dL (ref 0–200)
HDL: 47.9 mg/dL (ref >39.00)
LDL Cholesterol: 79 mg/dL (ref 0–99)
NonHDL: 98.83
Total CHOL/HDL Ratio: 3
Triglycerides: 98 mg/dL (ref 0.0–149.0)
VLDL: 19.6 mg/dL (ref 0.0–40.0)

## 2021-10-26 LAB — HEMOGLOBIN A1C: Hgb A1c MFr Bld: 7 % — ABNORMAL HIGH (ref 4.6–6.5)

## 2021-10-26 LAB — TSH: TSH: 2 u[IU]/mL (ref 0.35–5.50)

## 2021-10-26 MED ORDER — ALLOPURINOL 100 MG PO TABS: ORAL_TABLET | ORAL | 3 refills | Status: DC

## 2021-10-26 MED ORDER — POTASSIUM CHLORIDE CRYS ER 20 MEQ PO TBCR: EXTENDED_RELEASE_TABLET | ORAL | 3 refills | Status: DC

## 2021-10-26 MED ORDER — MELOXICAM 7.5 MG PO TABS: ORAL_TABLET | ORAL | 3 refills | Status: DC

## 2021-10-26 MED ORDER — AMLODIPINE BESYLATE 2.5 MG PO TABS: ORAL_TABLET | ORAL | 3 refills | Status: DC

## 2021-10-26 NOTE — Progress Notes (Signed)
Subjective:    Patient ID: Kristina Allison, female    DOB: 04-09-1935, 86 y.o.   MRN: [id number]  HPI  Patient presents for yearly preventative medicine examination. She is a pleasant 86 year old female who  has a past medical history of COLONIC POLYPS, HX OF (11/28/2006), DVT (03/10/2007), Edema (02/21/2007), ESOPHAGITIS NOS (09/27/2006), GOUT (06/04/2007), HIP PAIN, LEFT (01/30/2007), HYPERTENSION (11/28/2006), OBESITY NOS (09/27/2006), OSTEOARTHRITIS (11/28/2006), OSTEOARTHROSIS, GENERALIZED, UNSPC SITE (09/27/2006), PHLEBITIS&THROMBOPHLEBITIS LOWER EXTREM UNSPEC (03/07/2007), STATE, SYMPTOMATIC MENOPAUSE/FEM CLIMACTERIC (09/27/2006), and VENOUS INSUFFICIENCY, CHRONIC, RIGHT LEG (09/06/2009).  History of gout-managed with allopurinol 100 mg daily.  She denies any recent gout flares  Hypertension-well-controlled with Norvasc 2.5 mg and losartan/HCTZ 100-12.5 mg.  She is supplemented with potassium.  Denies chest pain, shortness of breath, dizziness, lightheadedness, headaches, or syncopal episodes BP Readings from Last 3 Encounters:  10/26/21 120/68  09/11/21 112/68  01/26/21 (!) 142/70   Osteoarthritis-mostly in her hips and knees.  Managed with Mobic 7.5 mg daily  OAB-well-controlled with Myrbetriq 50 mg daily  Glaucoma-managed by [facility].  She denies any changes in vision or eye pressure since her last exam in June 2022. Prescribed Latanoprost 0.005% 1 drop in both eyes at bedtime  DM Type 2 -diagnosed in August 2022 at her CPE where her A1c was 6.8.  She has been diet controlled Lab Results  Component Value Date   HGBA1C 6.5 01/26/2021   All immunizations and health maintenance protocols were reviewed with the patient and needed orders were placed.She will get her vaccinations at the pharmacy   Appropriate screening laboratory values were ordered for the patient including screening of hyperlipidemia, renal function and hepatic function.  Medication reconciliation,  past medical history,  social history, problem list and allergies were reviewed in detail with the patient  Goals were established with regard to weight loss, exercise, and  diet in compliance with medications Wt Readings from Last 3 Encounters:  10/26/21 220 lb (99.8 kg)  09/11/21 217 lb 12.8 oz (98.8 kg)  04/27/21 222 lb (100.7 kg)    Review of Systems  Constitutional: Negative.   HENT: Negative.    Eyes: Negative.   Respiratory: Negative.    Cardiovascular: Negative.   Gastrointestinal: Negative.   Endocrine: Negative.   Genitourinary: Negative.   Musculoskeletal:  Positive for arthralgias and gait problem.  Skin: Negative.   Allergic/Immunologic: Negative.   Hematological: Negative.   Psychiatric/Behavioral: Negative.         Objective:   Physical Exam Vitals and nursing note reviewed.  Constitutional:      General: She is not in acute distress.    Appearance: Normal appearance. She is well-developed. She is not ill-appearing.  HENT:     Head: Normocephalic and atraumatic.     Right Ear: Tympanic membrane, ear canal and external ear normal. There is no impacted cerumen.     Left Ear: Tympanic membrane, ear canal and external ear normal. There is no impacted cerumen.     Nose: Nose normal. No congestion or rhinorrhea.     Mouth/Throat:     Mouth: Mucous membranes are moist.     Pharynx: Oropharynx is clear. No oropharyngeal exudate or posterior oropharyngeal erythema.  Eyes:     General: No scleral icterus.       Right eye: No discharge.        Left eye: No discharge.     Extraocular Movements: Extraocular movements intact.     Conjunctiva/sclera: Conjunctivae normal.  Pupils: Pupils are equal, round, and reactive to light.  Neck:     Thyroid: No thyromegaly.     Vascular: No carotid bruit.     Trachea: No tracheal deviation.  Cardiovascular:     Rate and Rhythm: Normal rate and regular rhythm.     Pulses: Normal pulses.     Heart sounds: Normal heart sounds. No murmur heard.     No friction rub. No gallop.  Pulmonary:     Effort: Pulmonary effort is normal. No respiratory distress.     Breath sounds: Normal breath sounds. No stridor. No wheezing, rhonchi or rales.  Chest:     Chest wall: No tenderness.  Abdominal:     General: Abdomen is flat. Bowel sounds are normal. There is no distension.     Palpations: Abdomen is soft. There is no mass.     Tenderness: There is no abdominal tenderness. There is no right CVA tenderness, left CVA tenderness, guarding or rebound.     Hernia: No hernia is present.  Musculoskeletal:        General: No swelling, tenderness, deformity or signs of injury. Normal range of motion.     Cervical back: Normal range of motion and neck supple.     Right lower leg: No edema.     Left lower leg: No edema.  Lymphadenopathy:     Cervical: No cervical adenopathy.  Skin:    General: Skin is warm and dry.     Capillary Refill: Capillary refill takes less than 2 seconds.     Coloration: Skin is not jaundiced or pale.     Findings: No bruising, erythema, lesion or rash.  Neurological:     General: No focal deficit present.     Mental Status: She is alert and oriented to person, place, and time.     Cranial Nerves: No cranial nerve deficit.     Sensory: No sensory deficit.     Motor: No weakness.     Coordination: Coordination normal.     Gait: Gait normal.     Deep Tendon Reflexes: Reflexes normal.  Psychiatric:        Mood and Affect: Mood normal.        Behavior: Behavior normal.        Thought Content: Thought content normal.        Judgment: Judgment normal.        Assessment & Plan:  1. Routine general medical examination at a health care facility - Follow up in one year or sooner if needed  - CBC with Differential/Platelet; Future - Comprehensive metabolic panel; Future - Hemoglobin A1c; Future - Lipid panel; Future - TSH; Future  2. Essential hypertension - Well controlled.  - No change in medications  - CBC with  Differential/Platelet; Future - Comprehensive metabolic panel; Future - Hemoglobin A1c; Future - Lipid panel; Future - TSH; Future - potassium chloride SA (KLOR-CON M) 20 MEQ tablet; TAKE 1 TABLET(20 MEQ) BY MOUTH DAILY  Dispense: 90 tablet; Refill: 3 - amLODipine (NORVASC) 2.5 MG tablet; TAKE 1 TABLET(2.5 MG) BY MOUTH DAILY  Dispense: 90 tablet; Refill: 3  3. Controlled type 2 diabetes mellitus with complication, without long-term current use of insulin ([place]) - Consider metformin  - Follow up in 6 months  - CBC with Differential/Platelet; Future - Comprehensive metabolic panel; Future - Hemoglobin A1c; Future - Lipid panel; Future - TSH; Future  4. OAB (overactive bladder) - Continue with myrbetriq  - CBC with Differential/Platelet; Future -  Comprehensive metabolic panel; Future - Hemoglobin A1c; Future - Lipid panel; Future - TSH; Future  5. Idiopathic gout, unspecified chronicity, unspecified site  - CBC with Differential/Platelet; Future - Comprehensive metabolic panel; Future - Hemoglobin A1c; Future - Lipid panel; Future - TSH; Future - allopurinol (ZYLOPRIM) 100 MG tablet; TAKE 1 TABLET(100 MG) BY MOUTH DAILY  Dispense: 90 tablet; Refill: 3  6. Primary osteoarthritis involving multiple joints  - CBC with Differential/Platelet; Future - Comprehensive metabolic panel; Future - Hemoglobin A1c; Future - Lipid panel; Future - TSH; Future - meloxicam (MOBIC) 7.5 MG tablet; TAKE 1 TABLET(7.5 MG) BY MOUTH DAILY  Dispense: 90 tablet; Refill: 3  Dorothyann Peng, NP

## 2021-10-26 NOTE — Patient Instructions (Addendum)
It was great seeing you today   We will follow up with you regarding your lab work   Please let me know if you need anything   We can do a knee injection anytime you want

## 2021-10-28 ENCOUNTER — Other Ambulatory Visit: Payer: Self-pay | Admitting: Adult Health

## 2021-10-28 DIAGNOSIS — I10 Essential (primary) hypertension: Secondary | ICD-10-CM

## 2021-10-28 DIAGNOSIS — N3281 Overactive bladder: Secondary | ICD-10-CM

## 2021-10-30 ENCOUNTER — Other Ambulatory Visit: Payer: Self-pay | Admitting: Adult Health

## 2021-10-30 DIAGNOSIS — M1000 Idiopathic gout, unspecified site: Secondary | ICD-10-CM

## 2021-11-07 ENCOUNTER — Encounter: Payer: Self-pay | Admitting: Adult Health

## 2021-11-07 ENCOUNTER — Ambulatory Visit (INDEPENDENT_AMBULATORY_CARE_PROVIDER_SITE_OTHER): Payer: PPO | Admitting: Adult Health

## 2021-11-07 VITALS — BP 130/60 | HR 85 | Temp 97.6°F | Ht 64.5 in | Wt 220.0 lb

## 2021-11-07 DIAGNOSIS — Z23 Encounter for immunization: Secondary | ICD-10-CM | POA: Diagnosis not present

## 2021-11-07 DIAGNOSIS — G8929 Other chronic pain: Secondary | ICD-10-CM

## 2021-11-07 DIAGNOSIS — M25561 Pain in right knee: Secondary | ICD-10-CM | POA: Diagnosis not present

## 2021-11-07 MED ORDER — METHYLPREDNISOLONE ACETATE 80 MG/ML IJ SUSP
80.0000 mg | Freq: Once | INTRAMUSCULAR | Status: AC
  Administered 2021-11-07: 80 mg via INTRA_ARTICULAR

## 2021-11-07 NOTE — Progress Notes (Signed)
Subjective:    Patient ID: Kristina Allison, female    DOB: 06-Jul-1935, 86 y.o.   MRN: [id number]  HPI 86 year old female who  has a past medical history of COLONIC POLYPS, HX OF (11/28/2006), DVT (03/10/2007), Edema (02/21/2007), ESOPHAGITIS NOS (09/27/2006), GOUT (06/04/2007), HIP PAIN, LEFT (01/30/2007), HYPERTENSION (11/28/2006), OBESITY NOS (09/27/2006), OSTEOARTHRITIS (11/28/2006), OSTEOARTHROSIS, GENERALIZED, UNSPC SITE (09/27/2006), PHLEBITIS&THROMBOPHLEBITIS LOWER EXTREM UNSPEC (03/07/2007), STATE, SYMPTOMATIC MENOPAUSE/FEM CLIMACTERIC (09/27/2006), and VENOUS INSUFFICIENCY, CHRONIC, RIGHT LEG (09/06/2009).  She presents to the office today for chronic right  knee pain.She reports constant pain but is worse with changing positions and walking. She has had a steroid injection in the knee in the past but it has been multiple years ago. She would like to try another to see if it gets her any relief.     Review of Systems See HPI   Past Medical History:  Diagnosis Date   COLONIC POLYPS, HX OF 11/28/2006   DVT 03/10/2007   Edema 02/21/2007   ESOPHAGITIS NOS 09/27/2006   GOUT 06/04/2007   HIP PAIN, LEFT 01/30/2007   HYPERTENSION 11/28/2006   OBESITY NOS 09/27/2006   OSTEOARTHRITIS 11/28/2006   OSTEOARTHROSIS, GENERALIZED, UNSPC SITE 09/27/2006   PHLEBITIS&THROMBOPHLEBITIS LOWER EXTREM UNSPEC 03/07/2007   STATE, SYMPTOMATIC MENOPAUSE/FEM CLIMACTERIC 09/27/2006   VENOUS INSUFFICIENCY, CHRONIC, RIGHT LEG 09/06/2009    Social History   Socioeconomic History   Marital status: Widowed    Spouse name: Not on file   Number of children: 7   Years of education: 12   Highest education level: High school graduate  Occupational History   Occupation: retired  Tobacco Use   Smoking status: Never   Smokeless tobacco: Never  Substance and Sexual Activity   Alcohol use: No   Drug use: No   Sexual activity: Not on file  Other Topics Concern   Not on file  Social History Narrative   Widowed   [place] 2 : son lives with  her   Likes word searches, reading, television   Social Determinants of Health   Financial Resource Strain: Low Risk  (04/27/2021)   Overall Financial Resource Strain (CARDIA)    Difficulty of Paying Living Expenses: Not hard at all  Food Insecurity: No Food Insecurity (04/27/2021)   Hunger Vital Sign    Worried About Running Out of Food in the Last Year: Never true    [place] in the Last Year: Never true  Transportation Needs: No Transportation Needs (04/27/2021)   PRAPARE - Hydrologist (Medical): No    Lack of Transportation (Non-Medical): No  Physical Activity: Inactive (04/27/2021)   Exercise Vital Sign    Days of Exercise per Week: 0 days    Minutes of Exercise per Session: 0 min  Stress: No Stress Concern Present (04/27/2021)   [place]    Feeling of Stress : Not at all  Social Connections: Moderately Integrated (04/27/2021)   Social Connection and Isolation Panel [NHANES]    Frequency of Communication with Friends and Family: More than three times a week    Frequency of Social Gatherings with Friends and Family: More than three times a week    Attends Religious Services: More than 4 times per year    Active Member of [employer] or Organizations: Yes    Attends Archivist Meetings: More than 4 times per year    Marital Status: Widowed  Intimate Partner Violence: Not  At Risk (04/27/2021)   Humiliation, Afraid, Rape, and Kick questionnaire    Fear of Current or Ex-Partner: No    Emotionally Abused: No    Physically Abused: No    Sexually Abused: No    Past Surgical History:  Procedure Laterality Date   ABDOMINAL HYSTERECTOMY      No family history on file.  Allergies  Allergen Reactions   Ciprofloxacin     Made her feel like she was [place],nausea   Macrobid [Nitrofurantoin Macrocrystal]     Made her feel like she was [place],nausea   Sulfacetamide Sodium  Itching and Rash   Sulfamethoxazole-Trimethoprim Itching and Rash    Current Outpatient Medications on File Prior to Visit  Medication Sig Dispense Refill   allopurinol (ZYLOPRIM) 100 MG tablet TAKE 1 TABLET(100 MG) BY MOUTH DAILY 90 tablet 3   amLODipine (NORVASC) 2.5 MG tablet TAKE 1 TABLET(2.5 MG) BY MOUTH DAILY 90 tablet 3   calcium carbonate (CALCIUM 600) 600 MG TABS tablet Take 600 mg by mouth 2 (two) times daily with a meal.     latanoprost (XALATAN) 0.005 % ophthalmic solution INSTILL 1 DROP IN BOTH EYES AT BEDTIME 10 mL 1   losartan-hydrochlorothiazide (HYZAAR) 100-12.5 MG tablet TAKE 1 TABLET BY MOUTH DAILY 90 tablet 3   meloxicam (MOBIC) 7.5 MG tablet TAKE 1 TABLET(7.5 MG) BY MOUTH DAILY 90 tablet 3   MYRBETRIQ 50 MG TB24 tablet TAKE 1 TABLET(50 MG) BY MOUTH DAILY 30 tablet 11   nystatin (MYCOSTATIN/NYSTOP) powder Apply 1 Application topically 3 (three) times daily. For intertrigo 30 g 0   potassium chloride SA (KLOR-CON M) 20 MEQ tablet TAKE 1 TABLET(20 MEQ) BY MOUTH DAILY 90 tablet 3   Vitamin D, Cholecalciferol, 25 MCG (1000 UT) CAPS Take by mouth daily.     No current facility-administered medications on file prior to visit.    There were no vitals taken for this visit.      Objective:   Physical Exam Vitals and nursing note reviewed.  Constitutional:      Appearance: Normal appearance.  Cardiovascular:     Rate and Rhythm: Normal rate and regular rhythm.     Pulses: Normal pulses.     Heart sounds: Normal heart sounds.  Pulmonary:     Effort: Pulmonary effort is normal.     Breath sounds: Normal breath sounds.  Musculoskeletal:        General: Tenderness present. Normal range of motion.  Skin:    General: Skin is warm and dry.  Neurological:     General: No focal deficit present.     Mental Status: She is alert and oriented to person, place, and time.  Psychiatric:        Mood and Affect: Mood normal.        Behavior: Behavior normal.        Thought  Content: Thought content normal.        Judgment: Judgment normal.       Assessment & Plan:   1. Chronic pain of right knee Discussed risks and benefits of corticosteroid injection and patient consented.  After prepping skin with betadine, injected 80 mg depomedrol and 2 cc of plain xylocaine with 22 gauge one and one half inch needle using anterolateral approach and pt tolerated well.  - methylPREDNISolone acetate (DEPO-MEDROL) injection 80 mg - Follow up instructions reviewed   2. Need for immunization against influenza  - Flu Vaccine QUAD High Dose(Fluad)  Dorothyann Peng,  NP

## 2021-11-23 DIAGNOSIS — N6081 Other benign mammary dysplasias of right breast: Secondary | ICD-10-CM | POA: Diagnosis not present

## 2021-11-23 LAB — HM MAMMOGRAPHY

## 2021-11-24 ENCOUNTER — Encounter: Payer: Self-pay | Admitting: Adult Health

## 2021-11-26 ENCOUNTER — Other Ambulatory Visit: Payer: Self-pay | Admitting: Adult Health

## 2021-11-26 DIAGNOSIS — M159 Polyosteoarthritis, unspecified: Secondary | ICD-10-CM

## 2021-11-27 ENCOUNTER — Telehealth: Payer: Self-pay | Admitting: Pharmacist

## 2021-11-27 NOTE — Progress Notes (Signed)
Chronic Care Management Pharmacy Note  11/28/2021 Name:  Kristina Allison MRN:  [id number] DOB:  13-Oct-1935  Summary: BP is not at goal < 140/90 per home readings A1c not quite at goal < 7%  Recommendations/Changes made from today's visit: -Recommended bringing BP cuff to next office visit to ensure accuracy -Recommended continued weekly BP monitoring at home -Recommended CGM trial if A1c is not at goal < 7% at next visit before addition of medications  Plan: Follow up BP assessment in 2 months   Subjective: Kristina Allison is an 86 y.o. year old female who is a primary patient of Dorothyann Peng, NP.  The CCM team was consulted for assistance with disease management and care coordination needs.    Engaged with patient by telephone for follow up visit in response to provider referral for pharmacy case management and/or care coordination services.   Consent to Services:  The patient was given information about Chronic Care Management services, agreed to services, and gave verbal consent prior to initiation of services.  Please see initial visit note for detailed documentation.   Patient Care Team: Dorothyann Peng, NP as PCP - General (Family Medicine) Viona Gilmore, [facility] as Pharmacist (Pharmacist)  Recent office visits: 11/07/21 Dorothyann Peng, NP: Patient presented for knee pain. Administered steroid injection in right knee. Administered influenza vaccine.   10/26/21 Dorothyann Peng, NP: Patient presented for annual exam. A1c increased to 7.0%. Recommended 3 month follow up. Decreased losartan-HCTZ to 100-12.5 mg.  09/11/21 Shanon Ace, MD: Patient presented for vaginal itching.  Prescribed fluconazole 150 mg x 1 dose and nystatin powder.  Recent consult visits: 08/31/21 Julian Reil (ophthalmology): Patient presented for primary open-angle glaucoma. Unable to access notes.  05/23/2021 Erroll Luna MD (Surgery) - Patient was seen for papilloma of right breast. No medication  changes. No follow as needed.  Hospital visits: None in previous 6 months   Objective:  Lab Results  Component Value Date   CREATININE 0.74 10/26/2021   BUN 19 10/26/2021   GFR 74.18 10/26/2021   GFRNONAA 75 10/23/2019   GFRAA 87 10/23/2019   NA 141 10/26/2021   K 4.1 10/26/2021   CALCIUM 9.3 10/26/2021   CO2 28 10/26/2021   GLUCOSE 146 (H) 10/26/2021    Lab Results  Component Value Date/Time   HGBA1C 7.0 (H) 10/26/2021 08:35 AM   HGBA1C 6.5 01/26/2021 08:58 AM   HGBA1C 6.8 (H) 10/25/2020 09:25 AM   GFR 74.18 10/26/2021 08:35 AM   GFR 78.51 10/25/2020 09:25 AM    Last diabetic Eye exam: No results found for: "HMDIABEYEEXA"  Last diabetic Foot exam: No results found for: "HMDIABFOOTEX"   Lab Results  Component Value Date   CHOL 147 10/26/2021   HDL 47.90 10/26/2021   LDLCALC 79 10/26/2021   TRIG 98.0 10/26/2021   CHOLHDL 3 10/26/2021       Latest Ref Rng & Units 10/26/2021    8:35 AM 10/25/2020    9:25 AM 10/23/2019   10:52 AM  Hepatic Function  Total Protein 6.0 - 8.3 g/dL 7.0  7.3  7.1   Albumin 3.5 - 5.2 g/dL 3.7  3.8    AST 0 - 37 U/L 17  33  18   ALT 0 - 35 U/L 15  32  16   Alk Phosphatase 39 - 117 U/L 69  73    Total Bilirubin 0.2 - 1.2 mg/dL 1.1  1.2  1.3     Lab Results  Component Value  Date/Time   TSH 2.00 10/26/2021 08:35 AM   TSH 1.51 10/25/2020 09:25 AM       Latest Ref Rng & Units 10/26/2021    8:35 AM 10/25/2020    9:25 AM 10/23/2019   10:52 AM  CBC  WBC 4.0 - 10.5 K/uL 9.5  9.7  10.7   Hemoglobin 12.0 - 15.0 g/dL 13.6  14.0  14.3   Hematocrit 36.0 - 46.0 % 41.4  41.4  43.5   Platelets 150.0 - 400.0 K/uL 325.0  331.0  354     No results found for: "VD25OH"  Clinical ASCVD: No  The ASCVD Risk score (Arnett DK, et al., 2019) failed to calculate for the following reasons:   The 2019 ASCVD risk score is only valid for ages 65 to 92       09/11/2021    3:34 PM 04/27/2021    9:08 AM 01/26/2021    9:02 AM  Depression screen PHQ 2/9   Decreased Interest 0 0 0  Down, Depressed, Hopeless 0 0 0  PHQ - 2 Score 0 0 0  Altered sleeping 0  1  Tired, decreased energy 0  1  Change in appetite 0  1  Feeling bad or failure about yourself  0  0  Trouble concentrating 0  0  Moving slowly or fidgety/restless 0  0  Suicidal thoughts 0  0  PHQ-9 Score 0  3  Difficult doing work/chores Not difficult at all  Not difficult at all     Social History   Tobacco Use  Smoking Status Never  Smokeless Tobacco Never   BP Readings from Last 3 Encounters:  11/07/21 130/60  10/26/21 120/68  09/11/21 112/68   Pulse Readings from Last 3 Encounters:  11/07/21 85  10/26/21 87  09/11/21 82   Wt Readings from Last 3 Encounters:  11/07/21 220 lb (99.8 kg)  10/26/21 220 lb (99.8 kg)  09/11/21 217 lb 12.8 oz (98.8 kg)   BMI Readings from Last 3 Encounters:  11/07/21 37.18 kg/m  10/26/21 37.18 kg/m  09/11/21 36.24 kg/m    Assessment/Interventions: Review of patient past medical history, allergies, medications, health status, including review of consultants reports, laboratory and other test data, was performed as part of comprehensive evaluation and provision of chronic care management services.   SDOH:  (Social Determinants of Health) assessments and interventions performed: Yes  SDOH Interventions    Flowsheet Row Chronic Care Management from 11/28/2021 in [place] at [place] from 04/27/2021 in [place] at [place] Management from 09/21/2020 in [place] at [place] from 04/26/2020 in [place] at [place] Interventions -- Intervention Not Indicated -- Intervention Not Indicated  Housing Interventions -- Intervention Not Indicated -- Intervention Not Indicated  Transportation Interventions -- Intervention Not Indicated Intervention Not Indicated Intervention Not Indicated  Financial Strain  Interventions Intervention Not Indicated Intervention Not Indicated Intervention Not Indicated Intervention Not Indicated  Physical Activity Interventions -- Intervention Not Indicated -- Intervention Not Indicated  Stress Interventions -- Intervention Not Indicated -- Intervention Not Indicated  Social Connections Interventions -- Intervention Not Indicated -- Intervention Not Indicated       SDOH Screenings   Food Insecurity: No Food Insecurity (04/27/2021)  Housing: [place]  (04/27/2021)  Transportation Needs: No Transportation Needs (04/27/2021)  Alcohol Screen: Low Risk  (04/27/2021)  Depression (PHQ2-9): Low Risk  (09/11/2021)  Financial Resource Strain: Low Risk  (  11/28/2021)  Physical Activity: Inactive (04/27/2021)  Social Connections: Moderately Integrated (04/27/2021)  Stress: No Stress Concern Present (04/27/2021)  Tobacco Use: Low Risk  (11/07/2021)    CCM Care Plan  Allergies  Allergen Reactions   Ciprofloxacin     Made her feel like she was in [place],nausea   Macrobid [Nitrofurantoin Macrocrystal]     Made her feel like she was in [place],nausea   Sulfacetamide Sodium Itching and Rash   Sulfamethoxazole-Trimethoprim Itching and Rash    Medications Reviewed Today     Reviewed by Franco Collet, CMA (Certified Medical Assistant) on 11/07/21 at 1453  Med List Status: <None>   Medication Order Taking? Sig Documenting Provider Last Dose Status Informant  allopurinol (ZYLOPRIM) 100 MG tablet [id number] Yes TAKE 1 TABLET(100 MG) BY MOUTH DAILY Nafziger, Tommi Rumps, NP Taking Active   amLODipine (NORVASC) 2.5 MG tablet [id number] Yes TAKE 1 TABLET(2.5 MG) BY MOUTH DAILY Nafziger, Tommi Rumps, NP Taking Active   latanoprost (XALATAN) 0.005 % ophthalmic solution [id number] Yes INSTILL 1 DROP IN BOTH EYES AT BEDTIME Dorena Cookey, MD Taking Active   losartan-hydrochlorothiazide [employer]) 100-12.5 MG tablet [id number] Yes TAKE 1 TABLET BY MOUTH DAILY Nafziger, Tommi Rumps, NP Taking Active    meloxicam (MOBIC) 7.5 MG tablet [id number] Yes TAKE 1 TABLET(7.5 MG) BY MOUTH DAILY Nafziger, Tommi Rumps, NP Taking Active   MYRBETRIQ 50 MG TB24 tablet [id number] Yes TAKE 1 TABLET(50 MG) BY MOUTH DAILY Nafziger, Tommi Rumps, NP Taking Active   potassium chloride SA (KLOR-CON M) 20 MEQ tablet [id number] Yes TAKE 1 TABLET(20 MEQ) BY MOUTH DAILY Nafziger, Tommi Rumps, NP Taking Active   Vitamin D, Cholecalciferol, 25 MCG (1000 UT) CAPS [id number] Yes Take by mouth daily. [provider] Taking Active             Patient Active Problem List   Diagnosis Date Noted   Unilateral primary osteoarthritis, left knee 11/16/2019   Unilateral primary osteoarthritis, right knee 11/16/2019   Impingement syndrome of right shoulder 03/13/2018   Venous (peripheral) insufficiency 09/06/2009   GOUT 06/04/2007   DVT 03/10/2007   PHLEBITIS&THROMBOPHLEBITIS LOWER EXTREM UNSPEC 03/07/2007   Essential hypertension 11/28/2006   Osteoarthritis 11/28/2006   History of colonic polyps 11/28/2006   OBESITY NOS 09/27/2006   ESOPHAGITIS NOS 09/27/2006   STATE, SYMPTOMATIC MENOPAUSE/FEM CLIMACTERIC 09/27/2006    Immunization History  Administered Date(s) Administered   Fluad Quad(high Dose 65+) 11/22/2018, 09/16/2020, 11/07/2021   Influenza Split 11/29/2011   Influenza Whole 11/18/2007, 12/05/2009, 11/07/2010   Influenza, High Dose Seasonal PF 12/10/2012, 12/23/2014, 12/14/2015, 12/04/2016, 11/29/2017   Influenza,inj,Quad PF,6+ Mos 12/07/2013   Influenza-Unspecified 11/25/2019   PFIZER(Purple Top)SARS-COV-2 Vaccination 04/03/2019, 04/28/2019, 12/02/2019, 09/16/2020   Pfizer Covid-19 Vaccine Bivalent Booster 30yr & up 11/18/2021   Pneumococcal Conjugate-13 02/02/2013   Pneumococcal Polysaccharide-23 07/29/2008   Td 07/29/2008   Zoster Recombinat (Shingrix) 11/18/2021   Patient is still having some issues with overactive bladder but thinks the Myrbetriq is helping. Patient sometimes drinks a lot all at once and this  causes her to go to the bathroom multiple times. She isn't bothered by symptoms at a particular time of day and takes it in the morning.  Patient reports she was not surprised by her A1c going up. She has been eating more in general and staying at home more often which is why she is eating more. She wants to hold off on the CGM for now but will consider if her A1c does not change at the follow up with her PCP.  Patient has been trying to cut back on her intake lately.   Conditions to be addressed/monitored:  Hypertension, Osteoarthritis, Overactive Bladder, Gout, and Glaucoma  Conditions addressed this visit: Hypertension, diabetes, overactive bladder  Care Plan : [place]  Updates made by Viona Gilmore, [place] since 11/28/2021 12:00 AM     Problem: Problem: Hypertension, Osteoarthritis, Overactive Bladder, Gout, and Glaucoma      Long-Range Goal: Patient-Specific Goal   Start Date: 09/21/2020  Expected End Date: 09/21/2021  Recent Progress: On track  Priority: High  Note:   Current Barriers:  Unable to independently monitor therapeutic efficacy  Pharmacist Clinical Goal(s):  Patient will achieve adherence to monitoring guidelines and medication adherence to achieve therapeutic efficacy maintain control of blood pressure as evidenced by home blood pressure monitoring  through collaboration with PharmD and provider.   Interventions: 1:1 collaboration with Dorothyann Peng, NP regarding development and update of comprehensive plan of care as evidenced by provider attestation and co-signature Inter-disciplinary care team collaboration (see longitudinal plan of care) Comprehensive medication review performed; medication list updated in electronic medical record  Hypertension (BP goal <140/90) -Controlled (uncontrolled per home readings) -Current treatment: Amlodipine 2.5 mg 1 tablet daily - Appropriate, Effective, Safe, Accessible Losartan-hydrochlorothiazide 100-12.5 mg  1 tablet daily - Appropriate,  Effective, Safe, Accessible -Medications previously tried: none  -Current home readings: 147/78; checking 2-3 times a month; arm cuff (has not brought into office) -Current dietary habits: has cut back on salt some but does eat out -Current exercise habits: walking some but not consistently -Denies hypotensive/hypertensive symptoms -Educated on Exercise goal of 150 minutes per week; Importance of home blood pressure monitoring; Proper BP monitoring technique; -Counseled to monitor BP at home weekly, document, and provide log at future appointments -Counseled on diet and exercise extensively Recommended to continue current medication Recommended bringing BP cuff to next office visit.  Low potassium (Goal: 3.5-5) -Controlled -Current treatment  Potassium chloride 20 mEq 1 tablet daily - Appropriate, Effective, Safe, Accessible -Medications previously tried: none  -Recommended to continue current medication  Gout (Goal: prevent flare ups and maintain uric acid < 6) -Controlled -Current treatment  Allopurinol 100 mg 1 tablet daily - Appropriate, Effective, Safe, Accessible -Medications previously tried: none  -Counseled on foods/drinks that can increase risk of gout flare ups such as alcohol, fruit juices, sodas, some seafood, red meat and organ meat  Glaucoma (Goal: lower intraocular pressure) -Controlled -Current treatment  Latanoprost 0.005% 1 drop in both eyes at bedtime - Appropriate, Effective, Safe, Accessible -Medications previously tried: none  -Recommended to continue current medication  Overactive bladder (Goal: minimize symptoms) -Uncontrolled -Current treatment  Myrbetriq 50 mg 1 tablet - Appropriate, Effective, Safe, Accessible -Medications previously tried: Detrol LA (dizziness),  -Recommended to continue current medication  Osteoarthritis (Goal: minimize pain) -Controlled -Current treatment  Meloxicam 7.5 mg 1 tablet daily -  Appropriate, Effective, Query Safe, Accessible Voltaren gel as needed - Appropriate, Effective, Safe, Accessible -Medications previously tried: n/a  -Counseled on limiting use of NSAIDs due to risk of stomach bleeding and increased BP Recommended use of Tylenol and topicals such as Voltaren gel or capsaicin cream to minimize side effects  Diabetes (A1c goal <7%) -Controlled -Current medications: No medications -Medications previously tried: none  -Current home glucose readings fasting glucose: does not need to check post prandial glucose: does not need to check -Denies hypoglycemic/hyperglycemic symptoms -Current meal patterns:  breakfast: did not discuss  lunch: did not discuss  dinner: did not discuss snacks:  cut out a lot of sweets drinks: n/a -Current exercise: minimal -Educated on A1c and blood sugar goals; Continuous glucose monitoring; -Counseled to check feet daily and get yearly eye exams -Counseled on diet and exercise extensively   Health Maintenance -Vaccine gaps: tetanus, shingrix (2nd dose) -Current therapy:  No medications -Educated on Cost vs benefit of each product must be carefully weighed by individual consumer -Patient is satisfied with current therapy and denies issues -Counseled on recommended amounts of calcium (1200 mg) and vitamin D (1000 units) daily to support bone health Recommended supplementation with calcium citrate 600 mg and vitamin D 1000 units daily.  Patient Goals/Self-Care Activities Patient will:  - take medications as prescribed check blood pressure weekly, document, and provide at future appointments target a minimum of 150 minutes of moderate intensity exercise weekly  Follow Up Plan: The care management team will reach out to the patient again over the next 60 days.          Medication Assistance: None required.  Patient affirms current coverage meets needs.  Compliance/Adherence/Medication fill history: Care  Gaps: Shingrix (2nd shot), tetanus, urine microalbumin Last BP -  130/60 on 11/07/2021 Last A1C - 7.0 on 9/31/2023  Star-Rating Drugs: Losartan- HCTZ 100/12.5 mg - last filled 10/27/2021 90 DS at [facility]  Patient's preferred pharmacy is:  [facility], [place] - [address] [place] [address] Phone: [phone] Fax: [phone]   Uses pill box? No - takes them all at the same time Pt endorses 100% compliance  We discussed: Benefits of medication synchronization, packaging and delivery as well as enhanced pharmacist oversight with [employer]. Patient decided to: Continue current medication management strategy  Care Plan and Follow Up Patient Decision:  Patient agrees to Care Plan and Follow-up.  Plan: The care management team will reach out to the patient again over the next 30 days.  Jeni Salles, PharmD, [place] Pharmacist [place] at [place]

## 2021-11-27 NOTE — Chronic Care Management (AMB) (Signed)
    Chronic Care Management Pharmacy Assistant   Name: Kristina Allison  MRN: [id number] DOB: [birth year]  11/28/2021 APPOINTMENT REMINDER  Kristina Allison was reminded to have all medications, supplements and any blood glucose and blood pressure readings available for review with Jeni Salles, Pharm. D, at her telephone visit on 11/28/2021 at 12:00.  Care Gaps: AWV - scheduled 04/30/2022 Last BP - 130/60 on 11/07/2021 Last A1C - 7.0 on 9/31/2023 Urine ACR - never done Shingrix - never done Covid - overdue Tdap - postponed  Star Rating Drug: Losartan HCTZ 100/12.5 mg - last filled 10/27/2021 90 DS at [facility]  Any gaps in medications fill history? No  Kristina Allison [facility]  Catering manager [phone]

## 2021-11-28 ENCOUNTER — Ambulatory Visit (INDEPENDENT_AMBULATORY_CARE_PROVIDER_SITE_OTHER): Payer: PPO | Admitting: Pharmacist

## 2021-11-28 DIAGNOSIS — N3281 Overactive bladder: Secondary | ICD-10-CM

## 2021-11-28 DIAGNOSIS — I10 Essential (primary) hypertension: Secondary | ICD-10-CM

## 2021-11-28 DIAGNOSIS — E118 Type 2 diabetes mellitus with unspecified complications: Secondary | ICD-10-CM

## 2021-12-15 ENCOUNTER — Telehealth: Payer: Self-pay | Admitting: Adult Health

## 2021-12-15 NOTE — Telephone Encounter (Signed)
[place] for form? Please advise. Pt stated she is unable to walk a long distance like she use to.

## 2021-12-15 NOTE — Telephone Encounter (Signed)
Patient requesting paperwork for handicap placard be completed.

## 2021-12-15 NOTE — Telephone Encounter (Signed)
Noted  

## 2021-12-19 NOTE — Telephone Encounter (Signed)
Patient notified of update  and verbalized understanding. 

## 2021-12-26 DIAGNOSIS — I10 Essential (primary) hypertension: Secondary | ICD-10-CM

## 2021-12-26 DIAGNOSIS — E1159 Type 2 diabetes mellitus with other circulatory complications: Secondary | ICD-10-CM | POA: Diagnosis not present

## 2022-01-04 ENCOUNTER — Telehealth: Payer: Self-pay | Admitting: Adult Health

## 2022-01-04 NOTE — Telephone Encounter (Signed)
Pt is in donut hole and would like samples of MYRBETRIQ 50 MG TB24 tablet  until jan 2024

## 2022-01-05 NOTE — Telephone Encounter (Signed)
Pt notified that a 1 box of samples are ready for pickup.

## 2022-01-09 ENCOUNTER — Other Ambulatory Visit: Payer: Self-pay | Admitting: Adult Health

## 2022-01-09 DIAGNOSIS — I10 Essential (primary) hypertension: Secondary | ICD-10-CM

## 2022-01-09 DIAGNOSIS — E118 Type 2 diabetes mellitus with unspecified complications: Secondary | ICD-10-CM

## 2022-01-25 ENCOUNTER — Encounter: Payer: Self-pay | Admitting: Adult Health

## 2022-01-25 ENCOUNTER — Ambulatory Visit (INDEPENDENT_AMBULATORY_CARE_PROVIDER_SITE_OTHER): Payer: PPO | Admitting: Adult Health

## 2022-01-25 VITALS — BP 130/80 | HR 85 | Temp 97.7°F | Ht 64.5 in | Wt 219.0 lb

## 2022-01-25 DIAGNOSIS — I10 Essential (primary) hypertension: Secondary | ICD-10-CM | POA: Diagnosis not present

## 2022-01-25 DIAGNOSIS — E118 Type 2 diabetes mellitus with unspecified complications: Secondary | ICD-10-CM | POA: Diagnosis not present

## 2022-01-25 DIAGNOSIS — M25561 Pain in right knee: Secondary | ICD-10-CM

## 2022-01-25 DIAGNOSIS — G8929 Other chronic pain: Secondary | ICD-10-CM | POA: Diagnosis not present

## 2022-01-25 LAB — POCT GLYCOSYLATED HEMOGLOBIN (HGB A1C): Hemoglobin A1C: 6.3 % — AB (ref 4.0–5.6)

## 2022-01-25 MED ORDER — METHYLPREDNISOLONE ACETATE 80 MG/ML IJ SUSP
80.0000 mg | Freq: Once | INTRAMUSCULAR | Status: AC
  Administered 2022-01-25: 80 mg via INTRA_ARTICULAR

## 2022-01-25 NOTE — Patient Instructions (Addendum)
It was great seeing you today   Your A1c was 6.3 - this improved from 7.0   Please follow up in 3 months

## 2022-01-25 NOTE — Progress Notes (Signed)
Subjective:    Patient ID: Kristina Allison, female    DOB: 1935/10/02, 86 y.o.   MRN: [id number]  HPI 86 year old femele who  has a past medical history of COLONIC POLYPS, HX OF (11/28/2006), DVT (03/10/2007), Edema (02/21/2007), ESOPHAGITIS NOS (09/27/2006), GOUT (06/04/2007), HIP PAIN, LEFT (01/30/2007), HYPERTENSION (11/28/2006), OBESITY NOS (09/27/2006), OSTEOARTHRITIS (11/28/2006), OSTEOARTHROSIS, GENERALIZED, UNSPC SITE (09/27/2006), PHLEBITIS&THROMBOPHLEBITIS LOWER EXTREM UNSPEC (03/07/2007), STATE, SYMPTOMATIC MENOPAUSE/FEM CLIMACTERIC (09/27/2006), and VENOUS INSUFFICIENCY, CHRONIC, RIGHT LEG (09/06/2009).  She presents to the office today for follow up regarding DM Type 2 and HTN   DM Type 2 - has been diet controlled for quite some time.  3 months ago her A1c jumped from the sixes to 7.0.  She has been working on reducing carbs and sugars.  Hypertension-well-controlled with Norvasc 2.5 mg and losartan/HCTZ 100-12.5 mg.  She is supplemented with potassium.  She denies chest pain, shortness of breath, dizziness, lightheadedness, or syncopal episodes  Chronic right knee pain -Roughly 3 months ago she had a injection in the office for chronic right knee pain.  She reports that this helped significantly but she has noticed that it has started to wear off.  She is wondering if she can get another knee injection today.   Review of Systems See HPI   Past Medical History:  Diagnosis Date   COLONIC POLYPS, HX OF 11/28/2006   DVT 03/10/2007   Edema 02/21/2007   ESOPHAGITIS NOS 09/27/2006   GOUT 06/04/2007   HIP PAIN, LEFT 01/30/2007   HYPERTENSION 11/28/2006   OBESITY NOS 09/27/2006   OSTEOARTHRITIS 11/28/2006   OSTEOARTHROSIS, GENERALIZED, UNSPC SITE 09/27/2006   PHLEBITIS&THROMBOPHLEBITIS LOWER EXTREM UNSPEC 03/07/2007   STATE, SYMPTOMATIC MENOPAUSE/FEM CLIMACTERIC 09/27/2006   VENOUS INSUFFICIENCY, CHRONIC, RIGHT LEG 09/06/2009    Social History   Socioeconomic History   Marital status: Widowed    Spouse  name: Not on file   Number of children: 7   Years of education: 12   Highest education level: High school graduate  Occupational History   Occupation: retired  Tobacco Use   Smoking status: Never   Smokeless tobacco: Never  Substance and Sexual Activity   Alcohol use: No   Drug use: No   Sexual activity: Not on file  Other Topics Concern   Not on file  Social History Narrative   Widowed   [place] 2 : son lives with her   Likes word searches, reading, television   Social Determinants of Health   Financial Resource Strain: Low Risk  (11/28/2021)   Overall Financial Resource Strain (CARDIA)    Difficulty of Paying Living Expenses: Not very hard  Food Insecurity: No Food Insecurity (04/27/2021)   Hunger Vital Sign    Worried About Running Out of Food in the Last Year: Never true    [place] in the Last Year: Never true  Transportation Needs: No Transportation Needs (04/27/2021)   PRAPARE - Hydrologist (Medical): No    Lack of Transportation (Non-Medical): No  Physical Activity: Inactive (04/27/2021)   Exercise Vital Sign    Days of Exercise per Week: 0 days    Minutes of Exercise per Session: 0 min  Stress: No Stress Concern Present (04/27/2021)   [place]    Feeling of Stress : Not at all  Social Connections: Moderately Integrated (04/27/2021)   Social Connection and Isolation Panel [NHANES]    Frequency of Communication with Friends  and Family: More than three times a week    Frequency of Social Gatherings with Friends and Family: More than three times a week    Attends Religious Services: More than 4 times per year    Active Member of Clubs or Organizations: Yes    Attends Archivist Meetings: More than 4 times per year    Marital Status: Widowed  Intimate Partner Violence: Not At Risk (04/27/2021)   Humiliation, Afraid, Rape, and Kick questionnaire    Fear of Current or  Ex-Partner: No    Emotionally Abused: No    Physically Abused: No    Sexually Abused: No    Past Surgical History:  Procedure Laterality Date   ABDOMINAL HYSTERECTOMY      No family history on file.  Allergies  Allergen Reactions   Ciprofloxacin     Made her feel like she was [place],nausea   Macrobid [Nitrofurantoin Macrocrystal]     Made her feel like she was [place],nausea   Sulfacetamide Sodium Itching and Rash   Sulfamethoxazole-Trimethoprim Itching and Rash    Current Outpatient Medications on File Prior to Visit  Medication Sig Dispense Refill   allopurinol (ZYLOPRIM) 100 MG tablet TAKE 1 TABLET(100 MG) BY MOUTH DAILY 90 tablet 3   amLODipine (NORVASC) 2.5 MG tablet TAKE 1 TABLET(2.5 MG) BY MOUTH DAILY 90 tablet 3   latanoprost (XALATAN) 0.005 % ophthalmic solution INSTILL 1 DROP IN BOTH EYES AT BEDTIME 10 mL 1   losartan-hydrochlorothiazide (HYZAAR) 100-12.5 MG tablet TAKE 1 TABLET BY MOUTH DAILY 90 tablet 3   meloxicam (MOBIC) 7.5 MG tablet TAKE 1 TABLET(7.5 MG) BY MOUTH DAILY 90 tablet 3   MYRBETRIQ 50 MG TB24 tablet TAKE 1 TABLET(50 MG) BY MOUTH DAILY 30 tablet 11   potassium chloride SA (KLOR-CON M) 20 MEQ tablet TAKE 1 TABLET(20 MEQ) BY MOUTH DAILY 90 tablet 3   Vitamin D, Cholecalciferol, 25 MCG (1000 UT) CAPS Take by mouth daily.     No current facility-administered medications on file prior to visit.    BP 130/80   Pulse 85   Temp 97.7 F (36.5 C) (Oral)   Ht 5' 4.5" (1.638 m)   Wt 219 lb (99.3 kg)   SpO2 96%   BMI 37.01 kg/m       Objective:   Physical Exam Vitals and nursing note reviewed.  Constitutional:      Appearance: Normal appearance.  Cardiovascular:     Rate and Rhythm: Normal rate and regular rhythm.     Pulses: Normal pulses.     Heart sounds: Normal heart sounds.  Pulmonary:     Effort: Pulmonary effort is normal.     Breath sounds: Normal breath sounds.  Musculoskeletal:        General: Tenderness present. No  swelling.  Skin:    General: Skin is warm and dry.  Neurological:     General: No focal deficit present.     Mental Status: She is alert and oriented to person, place, and time.  Psychiatric:        Mood and Affect: Mood normal.        Behavior: Behavior normal.        Thought Content: Thought content normal.        Judgment: Judgment normal.       Assessment & Plan:  1. Controlled type 2 diabetes mellitus with complication, without long-term current use of insulin (HCC)  - POC HgB A1c- 6.3 -  has improved - Follow up in 3 months   2. Essential hypertension - Well controlled. No change in medications   3. Chronic pain of right knee Discussed risks and benefits of corticosteroid injection and patient consented.  After prepping skin with betadine, injected 80 mg depomedrol and 2 cc of plain xylocaine with 22 gauge one and one half inch needle using anterolateral approach and pt tolerated well.  methylPREDNISolone acetate (DEPO-MEDROL) injection 80 mg   Dorothyann Peng, NP

## 2022-01-29 ENCOUNTER — Telehealth: Payer: Self-pay

## 2022-01-29 NOTE — Telephone Encounter (Signed)
  Care Management   Visit Note  01/29/2022 Name: Kristina Allison MRN: [id number] DOB: Nov 19, 1935  Subjective: Kristina Allison is a 86 y.o. year old female who is a primary care patient of Dorothyann Peng, NP. The Care Management team was consulted for assistance.      Engaged with patient  by telephone  regarding plan for CCM services. Patient agreeable to outreach. Anticipates being available to complete appointment via telephone on 01/31/22.     PLAN Will follow up on 01/31/22 at 2 pm  [place] Manager/Chronic Care Management [phone]

## 2022-01-31 ENCOUNTER — Ambulatory Visit: Payer: PPO

## 2022-01-31 DIAGNOSIS — I10 Essential (primary) hypertension: Secondary | ICD-10-CM

## 2022-01-31 DIAGNOSIS — E118 Type 2 diabetes mellitus with unspecified complications: Secondary | ICD-10-CM

## 2022-02-13 ENCOUNTER — Encounter (HOSPITAL_BASED_OUTPATIENT_CLINIC_OR_DEPARTMENT_OTHER): Payer: Self-pay

## 2022-02-13 ENCOUNTER — Emergency Department (HOSPITAL_BASED_OUTPATIENT_CLINIC_OR_DEPARTMENT_OTHER): Payer: PPO | Admitting: Radiology

## 2022-02-13 ENCOUNTER — Inpatient Hospital Stay (HOSPITAL_BASED_OUTPATIENT_CLINIC_OR_DEPARTMENT_OTHER)
Admission: EM | Admit: 2022-02-13 | Discharge: 2022-02-15 | DRG: 176 | Disposition: A | Discharge status: Home / Self Care | Payer: PPO | Attending: Internal Medicine | Admitting: Internal Medicine

## 2022-02-13 ENCOUNTER — Other Ambulatory Visit: Payer: Self-pay

## 2022-02-13 DIAGNOSIS — I10 Essential (primary) hypertension: Secondary | ICD-10-CM | POA: Diagnosis not present

## 2022-02-13 DIAGNOSIS — R0602 Shortness of breath: Secondary | ICD-10-CM

## 2022-02-13 DIAGNOSIS — I2694 Multiple subsegmental pulmonary emboli without acute cor pulmonale: Principal | ICD-10-CM | POA: Diagnosis present

## 2022-02-13 DIAGNOSIS — Z1152 Encounter for screening for COVID-19: Secondary | ICD-10-CM | POA: Diagnosis not present

## 2022-02-13 DIAGNOSIS — Z881 Allergy status to other antibiotic agents status: Secondary | ICD-10-CM

## 2022-02-13 DIAGNOSIS — M1000 Idiopathic gout, unspecified site: Secondary | ICD-10-CM | POA: Diagnosis not present

## 2022-02-13 DIAGNOSIS — Z8672 Personal history of thrombophlebitis: Secondary | ICD-10-CM

## 2022-02-13 DIAGNOSIS — Z79899 Other long term (current) drug therapy: Secondary | ICD-10-CM

## 2022-02-13 DIAGNOSIS — I878 Other specified disorders of veins: Secondary | ICD-10-CM | POA: Diagnosis not present

## 2022-02-13 DIAGNOSIS — M109 Gout, unspecified: Secondary | ICD-10-CM | POA: Diagnosis present

## 2022-02-13 DIAGNOSIS — Z86010 Personal history of colonic polyps: Secondary | ICD-10-CM

## 2022-02-13 DIAGNOSIS — Z791 Long term (current) use of non-steroidal anti-inflammatories (NSAID): Secondary | ICD-10-CM | POA: Diagnosis not present

## 2022-02-13 DIAGNOSIS — I4581 Long QT syndrome: Secondary | ICD-10-CM | POA: Diagnosis not present

## 2022-02-13 DIAGNOSIS — Z90710 Acquired absence of both cervix and uterus: Secondary | ICD-10-CM | POA: Diagnosis not present

## 2022-02-13 DIAGNOSIS — E119 Type 2 diabetes mellitus without complications: Secondary | ICD-10-CM | POA: Diagnosis present

## 2022-02-13 DIAGNOSIS — I2699 Other pulmonary embolism without acute cor pulmonale: Secondary | ICD-10-CM | POA: Diagnosis not present

## 2022-02-13 DIAGNOSIS — Z6837 Body mass index (BMI) 37.0-37.9, adult: Secondary | ICD-10-CM

## 2022-02-13 DIAGNOSIS — Z882 Allergy status to sulfonamides status: Secondary | ICD-10-CM | POA: Diagnosis not present

## 2022-02-13 DIAGNOSIS — R9431 Abnormal electrocardiogram [ECG] [EKG]: Secondary | ICD-10-CM | POA: Diagnosis not present

## 2022-02-13 DIAGNOSIS — E669 Obesity, unspecified: Secondary | ICD-10-CM | POA: Diagnosis not present

## 2022-02-13 DIAGNOSIS — Z86718 Personal history of other venous thrombosis and embolism: Secondary | ICD-10-CM

## 2022-02-13 DIAGNOSIS — D72829 Elevated white blood cell count, unspecified: Secondary | ICD-10-CM | POA: Diagnosis present

## 2022-02-13 DIAGNOSIS — R000 Tachycardia, unspecified: Secondary | ICD-10-CM | POA: Diagnosis not present

## 2022-02-13 DIAGNOSIS — R5383 Other fatigue: Secondary | ICD-10-CM | POA: Diagnosis not present

## 2022-02-13 LAB — CBC
HCT: 45.7 % (ref 36.0–46.0)
Hemoglobin: 15.1 g/dL — ABNORMAL HIGH (ref 12.0–15.0)
MCH: 30 pg (ref 26.0–34.0)
MCHC: 33 g/dL (ref 30.0–36.0)
MCV: 90.7 fL (ref 80.0–100.0)
Platelets: 309 10*3/uL (ref 150–400)
RBC: 5.04 MIL/uL (ref 3.87–5.11)
RDW: 13.6 % (ref 11.5–15.5)
WBC: 14.3 10*3/uL — ABNORMAL HIGH (ref 4.0–10.5)
nRBC: 0 % (ref 0.0–0.2)

## 2022-02-13 LAB — RESP PANEL BY RT-PCR (RSV, FLU A&B, COVID)  RVPGX2
Influenza A by PCR: NEGATIVE
Influenza B by PCR: NEGATIVE
Resp Syncytial Virus by PCR: NEGATIVE
SARS Coronavirus 2 by RT PCR: NEGATIVE

## 2022-02-13 LAB — BASIC METABOLIC PANEL
Anion gap: 13 (ref 5–15)
BUN: 21 mg/dL (ref 8–23)
CO2: 24 mmol/L (ref 22–32)
Calcium: 9.8 mg/dL (ref 8.9–10.3)
Chloride: 102 mmol/L (ref 98–111)
Creatinine, Ser: 0.86 mg/dL (ref 0.44–1.00)
GFR, Estimated: >60 mL/min (ref >60)
Glucose, Bld: 145 mg/dL — ABNORMAL HIGH (ref 70–99)
Potassium: 3.9 mmol/L (ref 3.5–5.1)
Sodium: 139 mmol/L (ref 135–145)

## 2022-02-13 LAB — TROPONIN I (HIGH SENSITIVITY)
Troponin I (High Sensitivity): 13 ng/L (ref <18)
Troponin I (High Sensitivity): 20 ng/L — ABNORMAL HIGH (ref <18)

## 2022-02-13 NOTE — ED Provider Notes (Signed)
[place] EMERGENCY DEPT Provider Note   CSN: [id number] Arrival date & time: 02/13/22  1712     History {Add pertinent medical, surgical, social history, OB history to HPI:1} Chief Complaint  Patient presents with   Shortness of Breath    Kristina Allison is a 86 y.o. female.  Patient is a 86 yo female with pmh of DVT and LE edema secondary to venous insuffiencey presenting for sob. Pt states she awoke this morning with shortness of breath that is exaggerated by walking and improves with rest. No chest pain. She denies any fevers, chills, coughing, nasal congestion, sore throat, or sick contacts. She admits to baseline LE edema that has not worsened or changed. Chart review demonstrates hx of DVT. Pt is not currently on anticoagulation. She denies unilateral leg swelling, calf pain, recent immobilization, recent surgeries, or recent injuries.   The history is provided by the patient. No language interpreter was used.  Shortness of Breath Associated symptoms: no abdominal pain, no chest pain, no cough, no ear pain, no fever, no rash, no sore throat and no vomiting        Home Medications Prior to Admission medications   Medication Sig Start Date End Date Taking? Authorizing Provider  allopurinol (ZYLOPRIM) 100 MG tablet TAKE 1 TABLET(100 MG) BY MOUTH DAILY 10/31/21   Nafziger, Tommi Rumps, NP  amLODipine (NORVASC) 2.5 MG tablet TAKE 1 TABLET(2.5 MG) BY MOUTH DAILY 10/26/21   Nafziger, Tommi Rumps, NP  latanoprost (XALATAN) 0.005 % ophthalmic solution INSTILL 1 DROP IN BOTH EYES AT BEDTIME 12/27/17   Dorena Cookey, MD  losartan-hydrochlorothiazide [facility]) 100-12.5 MG tablet TAKE 1 TABLET BY MOUTH DAILY 03/31/21   Nafziger, Tommi Rumps, NP  meloxicam (MOBIC) 7.5 MG tablet TAKE 1 TABLET(7.5 MG) BY MOUTH DAILY 10/26/21   Nafziger, Tommi Rumps, NP  MYRBETRIQ 50 MG TB24 tablet TAKE 1 TABLET(50 MG) BY MOUTH DAILY 10/31/21   Nafziger, Tommi Rumps, NP  potassium chloride SA (KLOR-CON M) 20 MEQ tablet TAKE 1  TABLET(20 MEQ) BY MOUTH DAILY 10/31/21   Nafziger, Tommi Rumps, NP  Vitamin D, Cholecalciferol, 25 MCG (1000 UT) CAPS Take by mouth daily.    [provider]      Allergies    Ciprofloxacin, Macrobid [nitrofurantoin macrocrystal], Sulfacetamide sodium, and Sulfamethoxazole-trimethoprim    Review of Systems   Review of Systems  Constitutional:  Negative for chills and fever.  HENT:  Negative for ear pain and sore throat.   Eyes:  Negative for pain and visual disturbance.  Respiratory:  Positive for shortness of breath. Negative for cough.   Cardiovascular:  Negative for chest pain and palpitations.  Gastrointestinal:  Negative for abdominal pain and vomiting.  Genitourinary:  Negative for dysuria and hematuria.  Musculoskeletal:  Negative for arthralgias and back pain.  Skin:  Negative for color change and rash.  Neurological:  Negative for seizures and syncope.  All other systems reviewed and are negative.   Physical Exam Updated Vital Signs BP (!) 163/84   Pulse 86   Temp 98.2 F (36.8 C) (Oral)   Resp 12   Ht 5' 4.5" (1.638 m)   Wt 99.3 kg   SpO2 97%   BMI 37.00 kg/m  Physical Exam Vitals and nursing note reviewed.  Constitutional:      General: She is not in acute distress.    Appearance: She is well-developed.  HENT:     Head: Normocephalic and atraumatic.  Eyes:     Conjunctiva/sclera: Conjunctivae normal.  Cardiovascular:  Rate and Rhythm: Normal rate and regular rhythm.     Pulses:          Radial pulses are 2+ on the right side and 2+ on the left side.     Heart sounds: No murmur heard. Pulmonary:     Effort: Pulmonary effort is normal. No respiratory distress.     Breath sounds: Normal breath sounds.  Abdominal:     Palpations: Abdomen is soft.     Tenderness: There is no abdominal tenderness.  Musculoskeletal:        General: No swelling.     Cervical back: Neck supple.     Right lower leg: 1+ Edema present.     Left lower leg: 1+ Edema present.   Skin:    General: Skin is warm and dry.     Capillary Refill: Capillary refill takes less than 2 seconds.  Neurological:     Mental Status: She is alert.  Psychiatric:        Mood and Affect: Mood normal.     ED Results / Procedures / Treatments   Labs (all labs ordered are listed, but only abnormal results are displayed) Labs Reviewed  BASIC METABOLIC PANEL - Abnormal; Notable for the following components:      Result Value   Glucose, Bld 145 (*)    All other components within normal limits  CBC - Abnormal; Notable for the following components:   WBC 14.3 (*)    Hemoglobin 15.1 (*)    All other components within normal limits  TROPONIN I (HIGH SENSITIVITY) - Abnormal; Notable for the following components:   Troponin I (High Sensitivity) 20 (*)    All other components within normal limits  RESP PANEL BY RT-PCR (RSV, FLU A&B, COVID)  [id number]  D-DIMER, QUANTITATIVE  TROPONIN I (HIGH SENSITIVITY)    EKG EKG Interpretation  Date/Time:  Tuesday February 13 2022 17:21:23 EST Ventricular Rate:  110 PR Interval:  104 QRS Duration: 74 QT Interval:  414 QTC Calculation: 560 R Axis:   -52 Text Interpretation: *** Critical Test Result: Long QTc Sinus tachycardia with short PR Left anterior fascicular block ST & T wave abnormality, consider lateral ischemia Prolonged QT Abnormal ECG No previous ECGs available Confirmed by Campbell Stall ([id number]) on 40/09/6759 11:40:54 PM  Radiology DG Chest 2 View  Result Date: 02/13/2022 CLINICAL DATA:  Short of breath EXAM: CHEST - 2 VIEW COMPARISON:  CT chest 10/18/2004 FINDINGS: The heart size and mediastinal contours are within normal limits. Both lungs are clear. The visualized skeletal structures are unremarkable. IMPRESSION: No active cardiopulmonary disease. Electronically Signed   By: Franchot Gallo M.D.   On: 02/13/2022 17:58    Procedures Procedures  {Document cardiac monitor, telemetry assessment procedure when  appropriate:1}  Medications Ordered in ED Medications - No data to display  ED Course/ Medical Decision Making/ A&P                           Medical Decision Making Amount and/or Complexity of Data Reviewed Labs: ordered. Radiology: ordered.   12:01 AM 86 yo female with pmh of DVT and LE edema secondary to venous insuffiencey presenting for sob. Pt is Aox3, no acute distress, afebrile, with stable vitals. No signs of respiratory distress including no hypoxia, tachypnea, nasal flaring, or tachycardia.   Stable ECG without signs of myocardial infarction. Pt has prolonged QTC and notified of changes. Recommended for f/u with primary care for medication  eval. Troponin wnl. CXR demonstrates no acute process. No pneumothorax. No hemothorax. No pneumonia. Covid and influenza PCR normal. D-dimer pending.   {Document critical care time when appropriate:1} {Document review of labs and clinical decision tools ie heart score, Chads2Vasc2 etc:1}  {Document your independent review of radiology images, and any outside records:1} {Document your discussion with family members, caretakers, and with consultants:1} {Document social determinants of health affecting pt's care:1} {Document your decision making why or why not admission, treatments were needed:1} Final Clinical Impression(s) / ED Diagnoses Final diagnoses:  Prolonged Q-T interval on ECG  SOB (shortness of breath)    Rx / DC Orders ED Discharge Orders     None

## 2022-02-13 NOTE — ED Triage Notes (Signed)
Patient here POV from Home.  Endorses SOB that began when the Patient awoke this AM.  No Other Associated Symptoms.   NAD Noted during Triage. A&Ox4. GCS 15. Ambulatory.

## 2022-02-13 NOTE — ED Notes (Signed)
Pt reassessed and vitals updated. Pt updated on wait status and advised to make staff aware of change and/or worsening of symptoms

## 2022-02-14 ENCOUNTER — Telehealth: Payer: Self-pay | Admitting: Pharmacist

## 2022-02-14 ENCOUNTER — Emergency Department (HOSPITAL_BASED_OUTPATIENT_CLINIC_OR_DEPARTMENT_OTHER): Payer: PPO

## 2022-02-14 ENCOUNTER — Other Ambulatory Visit (HOSPITAL_COMMUNITY): Payer: Self-pay

## 2022-02-14 ENCOUNTER — Encounter (HOSPITAL_COMMUNITY): Payer: Self-pay

## 2022-02-14 DIAGNOSIS — I2699 Other pulmonary embolism without acute cor pulmonale: Secondary | ICD-10-CM | POA: Diagnosis not present

## 2022-02-14 DIAGNOSIS — I10 Essential (primary) hypertension: Secondary | ICD-10-CM | POA: Diagnosis present

## 2022-02-14 DIAGNOSIS — E669 Obesity, unspecified: Secondary | ICD-10-CM

## 2022-02-14 DIAGNOSIS — Z1152 Encounter for screening for COVID-19: Secondary | ICD-10-CM | POA: Diagnosis not present

## 2022-02-14 DIAGNOSIS — Z8672 Personal history of thrombophlebitis: Secondary | ICD-10-CM | POA: Diagnosis not present

## 2022-02-14 DIAGNOSIS — Z86718 Personal history of other venous thrombosis and embolism: Secondary | ICD-10-CM | POA: Diagnosis not present

## 2022-02-14 DIAGNOSIS — Z86010 Personal history of colonic polyps: Secondary | ICD-10-CM | POA: Diagnosis not present

## 2022-02-14 DIAGNOSIS — M1000 Idiopathic gout, unspecified site: Secondary | ICD-10-CM | POA: Diagnosis not present

## 2022-02-14 DIAGNOSIS — D72829 Elevated white blood cell count, unspecified: Secondary | ICD-10-CM | POA: Diagnosis present

## 2022-02-14 DIAGNOSIS — I2694 Multiple subsegmental pulmonary emboli without acute cor pulmonale: Secondary | ICD-10-CM | POA: Diagnosis present

## 2022-02-14 DIAGNOSIS — I878 Other specified disorders of veins: Secondary | ICD-10-CM | POA: Diagnosis present

## 2022-02-14 DIAGNOSIS — M109 Gout, unspecified: Secondary | ICD-10-CM | POA: Diagnosis present

## 2022-02-14 DIAGNOSIS — Z881 Allergy status to other antibiotic agents status: Secondary | ICD-10-CM | POA: Diagnosis not present

## 2022-02-14 DIAGNOSIS — R9431 Abnormal electrocardiogram [ECG] [EKG]: Secondary | ICD-10-CM | POA: Insufficient documentation

## 2022-02-14 DIAGNOSIS — Z90710 Acquired absence of both cervix and uterus: Secondary | ICD-10-CM | POA: Diagnosis not present

## 2022-02-14 DIAGNOSIS — Z79899 Other long term (current) drug therapy: Secondary | ICD-10-CM | POA: Diagnosis not present

## 2022-02-14 DIAGNOSIS — R0602 Shortness of breath: Secondary | ICD-10-CM | POA: Diagnosis present

## 2022-02-14 DIAGNOSIS — Z6837 Body mass index (BMI) 37.0-37.9, adult: Secondary | ICD-10-CM | POA: Diagnosis not present

## 2022-02-14 DIAGNOSIS — Z791 Long term (current) use of non-steroidal anti-inflammatories (NSAID): Secondary | ICD-10-CM | POA: Diagnosis not present

## 2022-02-14 DIAGNOSIS — E119 Type 2 diabetes mellitus without complications: Secondary | ICD-10-CM | POA: Diagnosis present

## 2022-02-14 DIAGNOSIS — Z882 Allergy status to sulfonamides status: Secondary | ICD-10-CM | POA: Diagnosis not present

## 2022-02-14 LAB — CBC
HCT: 40.3 % (ref 36.0–46.0)
Hemoglobin: 13.9 g/dL (ref 12.0–15.0)
MCH: 30.8 pg (ref 26.0–34.0)
MCHC: 34.5 g/dL (ref 30.0–36.0)
MCV: 89.4 fL (ref 80.0–100.0)
Platelets: 274 10*3/uL (ref 150–400)
RBC: 4.51 MIL/uL (ref 3.87–5.11)
RDW: 13.5 % (ref 11.5–15.5)
WBC: 10.6 10*3/uL — ABNORMAL HIGH (ref 4.0–10.5)
nRBC: 0 % (ref 0.0–0.2)

## 2022-02-14 LAB — GLUCOSE, CAPILLARY
Glucose-Capillary: 124 mg/dL — ABNORMAL HIGH (ref 70–99)
Glucose-Capillary: 134 mg/dL — ABNORMAL HIGH (ref 70–99)

## 2022-02-14 LAB — CBG MONITORING, ED: Glucose-Capillary: 129 mg/dL — ABNORMAL HIGH (ref 70–99)

## 2022-02-14 LAB — D-DIMER, QUANTITATIVE: D-Dimer, Quant: 12.07 ug/mL-FEU — ABNORMAL HIGH (ref 0.00–0.50)

## 2022-02-14 LAB — HEPARIN LEVEL (UNFRACTIONATED): Heparin Unfractionated: 0.45 IU/mL (ref 0.30–0.70)

## 2022-02-14 MED ORDER — APIXABAN 5 MG PO TABS
10.0000 mg | ORAL_TABLET | Freq: Two times a day (BID) | ORAL | Status: DC
  Administered 2022-02-14 – 2022-02-15 (×3): 10 mg via ORAL
  Filled 2022-02-14 (×3): qty 2

## 2022-02-14 MED ORDER — INSULIN ASPART 100 UNIT/ML IJ SOLN
0.0000 [IU] | Freq: Three times a day (TID) | INTRAMUSCULAR | Status: DC
  Administered 2022-02-14: 2 [IU] via SUBCUTANEOUS

## 2022-02-14 MED ORDER — HEPARIN BOLUS VIA INFUSION
4000.0000 [IU] | Freq: Once | INTRAVENOUS | Status: AC
  Administered 2022-02-14: 4000 [IU] via INTRAVENOUS

## 2022-02-14 MED ORDER — HEPARIN (PORCINE) 25000 UT/250ML-% IV SOLN
1100.0000 [IU]/h | INTRAVENOUS | Status: DC
  Administered 2022-02-14: 1100 [IU]/h via INTRAVENOUS
  Filled 2022-02-14: qty 250

## 2022-02-14 MED ORDER — SODIUM CHLORIDE 0.9% FLUSH
3.0000 mL | Freq: Two times a day (BID) | INTRAVENOUS | Status: DC
  Administered 2022-02-14: 3 mL via INTRAVENOUS

## 2022-02-14 MED ORDER — ACETAMINOPHEN 650 MG RE SUPP: 650.0000 mg | Freq: Four times a day (QID) | RECTAL | Status: DC | PRN

## 2022-02-14 MED ORDER — POLYETHYLENE GLYCOL 3350 17 G PO PACK: 17.0000 g | PACK | Freq: Every day | ORAL | Status: DC | PRN

## 2022-02-14 MED ORDER — IOHEXOL 350 MG/ML SOLN
100.0000 mL | Freq: Once | INTRAVENOUS | Status: AC | PRN
  Administered 2022-02-14: 75 mL via INTRAVENOUS

## 2022-02-14 MED ORDER — ALLOPURINOL 100 MG PO TABS
100.0000 mg | ORAL_TABLET | Freq: Every day | ORAL | Status: DC
  Administered 2022-02-15: 100 mg via ORAL
  Filled 2022-02-14: qty 1

## 2022-02-14 MED ORDER — AMLODIPINE BESYLATE 2.5 MG PO TABS
2.5000 mg | ORAL_TABLET | Freq: Every day | ORAL | Status: DC
  Administered 2022-02-14 – 2022-02-15 (×2): 2.5 mg via ORAL
  Filled 2022-02-14 (×2): qty 1

## 2022-02-14 MED ORDER — ACETAMINOPHEN 325 MG PO TABS: 650.0000 mg | ORAL_TABLET | Freq: Four times a day (QID) | ORAL | Status: DC | PRN

## 2022-02-14 MED ORDER — HYDROCHLOROTHIAZIDE 12.5 MG PO TABS
12.5000 mg | ORAL_TABLET | Freq: Every day | ORAL | Status: DC
  Administered 2022-02-15: 12.5 mg via ORAL
  Filled 2022-02-14 (×2): qty 1

## 2022-02-14 MED ORDER — APIXABAN 5 MG PO TABS: 5.0000 mg | ORAL_TABLET | Freq: Two times a day (BID) | ORAL | Status: DC

## 2022-02-14 MED ORDER — LOSARTAN POTASSIUM 50 MG PO TABS
100.0000 mg | ORAL_TABLET | Freq: Every day | ORAL | Status: DC
  Administered 2022-02-14 – 2022-02-15 (×2): 100 mg via ORAL
  Filled 2022-02-14: qty 4
  Filled 2022-02-14: qty 2

## 2022-02-14 MED ORDER — LOSARTAN POTASSIUM-HCTZ 100-12.5 MG PO TABS: 1.0000 | ORAL_TABLET | Freq: Every day | ORAL | Status: DC

## 2022-02-14 MED ORDER — INSULIN ASPART 100 UNIT/ML IJ SOLN: 0.0000 [IU] | Freq: Every day | INTRAMUSCULAR | Status: DC

## 2022-02-14 NOTE — TOC Benefit Eligibility Note (Signed)
Patient Teacher, English as a foreign language completed.    The patient is currently admitted and upon discharge could be taking Eliquis Starter Pack.  The current 30 day co-pay is $168.85 due to being in Coverage Gap (donut hole).   The patient is insured through [employer] [place] Patient Advocate Specialist [place] Patient Advocate Team Direct Number: [phone]  Fax: [phone]

## 2022-02-14 NOTE — Progress Notes (Signed)
ANTICOAGULATION CONSULT NOTE - Initial Consult  Pharmacy Consult for Heparin  Indication: pulmonary embolus  Allergies  Allergen Reactions   Ciprofloxacin     Made her feel like she was in [place],nausea   Macrobid [Nitrofurantoin Macrocrystal]     Made her feel like she was [place],nausea   Sulfacetamide Sodium Itching and Rash   Sulfamethoxazole-Trimethoprim Itching and Rash    Patient Measurements: Height: 5' 4.5" (163.8 cm) Weight: 99.3 kg (218 lb 14.7 oz) IBW/kg (Calculated) : 55.85  Vital Signs: Temp: 98.7 F (37.1 C) (12/20 0348) Temp Source: Oral (12/20 0348) BP: 155/81 (12/20 0348) Pulse Rate: 85 (12/20 0348)  Labs: Recent Labs    02/13/22 1721 02/13/22 2200  HGB 15.1*  --   HCT 45.7  --   PLT 309  --   CREATININE 0.86  --   TROPONINIHS 13 20*    Estimated Creatinine Clearance: 56.3 mL/min (by C-G formula based on SCr of 0.86 mg/dL).   Medical History: Past Medical History:  Diagnosis Date   COLONIC POLYPS, HX OF 11/28/2006   DVT 03/10/2007   Edema 02/21/2007   ESOPHAGITIS NOS 09/27/2006   GOUT 06/04/2007   HIP PAIN, LEFT 01/30/2007   HYPERTENSION 11/28/2006   OBESITY NOS 09/27/2006   OSTEOARTHRITIS 11/28/2006   OSTEOARTHROSIS, GENERALIZED, UNSPC SITE 09/27/2006   PHLEBITIS&THROMBOPHLEBITIS LOWER EXTREM UNSPEC 03/07/2007   STATE, SYMPTOMATIC MENOPAUSE/FEM CLIMACTERIC 09/27/2006   VENOUS INSUFFICIENCY, CHRONIC, RIGHT LEG 09/06/2009     Assessment: 86 y/o F in [facility] ED at [facility] with shortness of breath, new onset PE via CT angio, starting heparin, CBC/renal function ok, PTA meds reviewed.   Goal of Therapy:  Heparin level 0.3-0.7 units/ml Monitor platelets by anticoagulation protocol: Yes   Plan:  Heparin 4000 units BOLUS Start heparin drip at 1100 units/hr 1200 Heparin level Daily CBC/Heparin level Monitor for bleeding  Narda Bonds, PharmD, BCPS Clinical Pharmacist Phone: [phone]

## 2022-02-14 NOTE — Discharge Instructions (Addendum)
_________________________________________________________________________________ Information on my medicine - ELIQUIS (apixaban)  This medication education was reviewed with me or my healthcare representative as part of my discharge preparation.  Why was Eliquis prescribed for you? Eliquis was prescribed to treat blood clots that may have been found in the veins of your legs (deep vein thrombosis) or in your lungs (pulmonary embolism) and to reduce the risk of them occurring again.  What do You need to know about Eliquis ? The starting dose is 10 mg (two 5 mg tablets) taken TWICE daily for the FIRST SEVEN (7) DAYS, then on 02/21/22 AM  the dose is reduced to ONE 5 mg tablet taken TWICE daily.  Eliquis may be taken with or without food.   Try to take the dose about the same time in the morning and in the evening. If you have difficulty swallowing the tablet whole please discuss with your pharmacist how to take the medication safely.  Take Eliquis exactly as prescribed and DO NOT stop taking Eliquis without talking to the doctor who prescribed the medication.  Stopping may increase your risk of developing a new blood clot.  Refill your prescription before you run out.  After discharge, you should have regular check-up appointments with your healthcare provider that is prescribing your Eliquis.    What do you do if you miss a dose? If a dose of ELIQUIS is not taken at the scheduled time, take it as soon as possible on the same day and twice-daily administration should be resumed. The dose should not be doubled to make up for a missed dose.  Important Safety Information A possible side effect of Eliquis is bleeding. You should call your healthcare provider right away if you experience any of the following: Bleeding from an injury or your nose that does not stop. Unusual colored urine (red or dark [place]) or unusual colored stools (red or black). Unusual bruising for unknown reasons. A  serious fall or if you hit your head (even if there is no bleeding).  Some medicines may interact with Eliquis and might increase your risk of bleeding or clotting while on Eliquis. To help avoid this, consult your healthcare provider or pharmacist prior to using any new prescription or non-prescription medications, including herbals, vitamins, non-steroidal anti-inflammatory drugs (NSAIDs) and supplements.  This website has more information on Eliquis (apixaban): http://www.eliquis.com/eliquis/home   ========================================  Pulmonary Embolism    A pulmonary embolism (PE) is a sudden blockage or decrease of blood flow in one or both lungs. Most blockages come from a blood clot that forms in the vein of a lower leg, thigh, or arm (deep vein thrombosis, DVT) and travels to the lungs. A clot is blood that has thickened into a gel or solid. PE is a dangerous and life-threatening condition that needs to be treated right away.  What are the causes? This condition is usually caused by a blood clot that forms in a vein and moves to the lungs. In rare cases, it may be caused by air, fat, part of a tumor, or other tissue that moves through the veins and into the lungs.  What increases the risk? The following factors may make you more likely to develop this condition: Experiencing a traumatic injury, such as breaking a hip or leg. Having: A spinal cord injury. Orthopedic surgery, especially hip or knee replacement. Any major surgery. A stroke. DVT. Blood clots or blood clotting disease. Long-term (chronic) lung or heart disease. Cancer treated with chemotherapy. A central venous catheter.  Taking medicines that contain estrogen. These include birth control pills and hormone replacement therapy. Being: Pregnant. In the period of time after your baby is delivered (postpartum). Older than age 69. Overweight. A smoker, especially if you have other risks.  What are the signs  or symptoms? Symptoms of this condition usually start suddenly and include: Shortness of breath during activity or at rest. Coughing, coughing up blood, or coughing up blood-tinged mucus. Chest pain that is often worse with deep breaths. Rapid or irregular heartbeat. Feeling light-headed or dizzy. Fainting. Feeling anxious. Fever. Sweating. Pain and swelling in a leg. This is a symptom of DVT, which can lead to PE. How is this diagnosed? This condition may be diagnosed based on: Your medical history. A physical exam. Blood tests. CT pulmonary angiogram. This test checks blood flow in and around your lungs. Ventilation-perfusion scan, also called a lung VQ scan. This test measures air flow and blood flow to the lungs. An ultrasound of the legs.  How is this treated? Treatment for this condition depends on many factors, such as the cause of your PE, your risk for bleeding or developing more clots, and other medical conditions you have. Treatment aims to remove, dissolve, or stop blood clots from forming or growing larger. Treatment may include: Medicines, such as: Blood thinning medicines (anticoagulants) to stop clots from forming. Medicines that dissolve clots (thrombolytics). Procedures, such as: Using a flexible tube to remove a blood clot (embolectomy) or to deliver medicine to destroy it (catheter-directed thrombolysis). Inserting a filter into a large vein that carries blood to the heart (inferior vena cava). This filter (vena cava filter) catches blood clots before they reach the lungs. Surgery to remove the clot (surgical embolectomy). This is rare. You may need a combination of immediate, long-term (up to 3 months after diagnosis), and extended (more than 3 months after diagnosis) treatments. Your treatment may continue for several months (maintenance therapy). You and your health care provider will work together to choose the treatment program that is best for you.  Follow  these instructions at home: Medicines Take over-the-counter and prescription medicines only as told by your health care provider. If you are taking an anticoagulant medicine: Take the medicine every day at the same time each day. Understand what foods and drugs interact with your medicine. Understand the side effects of this medicine, including excessive bruising or bleeding. Ask your health care provider or pharmacist about other side effects.  General instructions Wear a medical alert bracelet or carry a medical alert card that says you have had a PE and lists what medicines you take. Ask your health care provider when you may return to your normal activities. Avoid sitting or lying for a long time without moving. Maintain a healthy weight. Ask your health care provider what weight is healthy for you. Do not use any products that contain nicotine or tobacco, such as cigarettes, e-cigarettes, and chewing tobacco. If you need help quitting, ask your health care provider. Talk with your health care provider about any travel plans. It is important to make sure that you are still able to take your medicine while on trips. Keep all follow-up visits as told by your health care provider. This is important.  Contact a health care provider if: You missed a dose of your blood thinner medicine.  Get help right away if: You have: New or increased pain, swelling, warmth, or redness in an arm or leg. Numbness or tingling in an arm or leg. Shortness  of breath during activity or at rest. A fever. Chest pain. A rapid or irregular heartbeat. A severe headache. Vision changes. A serious fall or accident, or you hit your head. Stomach (abdominal) pain. Blood in your vomit, stool, or urine. A cut that will not stop bleeding. You cough up blood. You feel light-headed or dizzy. You cannot move your arms or legs. You are confused or have memory loss.  These symptoms may represent a serious problem  that is an emergency. Do not wait to see if the symptoms will go away. Get medical help right away. Call your local emergency services (911 in the [place] Do not drive yourself to the hospital. Summary A pulmonary embolism (PE) is a sudden blockage or decrease of blood flow in one or both lungs. PE is a dangerous and life-threatening condition that needs to be treated right away. Treatments for this condition usually include medicines to thin your blood (anticoagulants) or medicines to break apart blood clots (thrombolytics). If you are given blood thinners, it is important to take the medicine every day at the same time each day. Understand what foods and drugs interact with any medicines that you are taking. If you have signs of PE or DVT, call your local emergency services (911 in the [place] This information is not intended to replace advice given to you by your health care provider. Make sure you discuss any questions you have with your health care provider. Document Revised: 11/20/2017 Document Reviewed: 11/20/2017 Elsevier Patient Education  2020 [employer].

## 2022-02-14 NOTE — Progress Notes (Addendum)
This is a 86 year old female with past medical history of hypertension, gout, overactive bladder.  Additionally, she has a remote history lower extremity DVT, off Eliquis for years.  The patient presented to [facility] with complaints of shortness of breath x 1 day that is worse with ambulation.  She also has mild bilateral lower extremity swelling..  There is no reports of chest pain, fevers or chills.  In the ER, imaging positive for bilateral pulmonary arteries to the segmental level. Lobar/segmental left upper and lower lobe pulmonary emboli. Segmental and subsegmental right upper, middle, and lower lobe pulmonary emboli. Overall clot burden is moderate.  In the ER patient stable on room air.  Heparin drip initiated.  Transfer requested.

## 2022-02-14 NOTE — H&P (Signed)
History and Physical   Kristina Allison OFB:[id number] DOB: [birth year] DOA: 02/13/2022  PCP: Dorothyann Peng, NP   Patient coming from: Home  Chief Complaint: Shortness of breath   HPI: Kristina Allison is a 86 y.o. female with medical history significant of venous stasis, obesity, gout, hypertension, DVT presenting with shortness of breath.  Patient reports waking up yesterday morning with acute shortness of breath.  Shortness of breath was noted to be worse with exertion and better with rest.  She denied any other associated symptoms.  Does have history of DVT in 2009.  Denies fevers, chills, chest pain, abdominal pain, constipation, diarrhea, nausea, vomiting.  ED Course: Vital signs in the ED significant for blood pressure in the 782U to 235T systolic.  Lab workup included BMP with glucose 145.  CBC with leukocytosis to 14.3 and hemoglobin 15.1.  Troponin normal on first check and mildly elevated at 20 on repeat.  D-dimer elevated at 12.07.  Respiratory panel for flu and COVID-negative.  CTA PE study showed bilateral lobar/segmental pulmonary emboli, moderate clot burden, no evidence of right heart strain on CT.  Patient started on heparin drip.  Review of Systems: As per HPI otherwise all other systems reviewed and are negative.  Past Medical History:  Diagnosis Date   COLONIC POLYPS, HX OF 11/28/2006   DVT 03/10/2007   Edema 02/21/2007   ESOPHAGITIS NOS 09/27/2006   GOUT 06/04/2007   HIP PAIN, LEFT 01/30/2007   HYPERTENSION 11/28/2006   OBESITY NOS 09/27/2006   OSTEOARTHRITIS 11/28/2006   OSTEOARTHROSIS, GENERALIZED, UNSPC SITE 09/27/2006   PHLEBITIS&THROMBOPHLEBITIS LOWER EXTREM UNSPEC 03/07/2007   STATE, SYMPTOMATIC MENOPAUSE/FEM CLIMACTERIC 09/27/2006   VENOUS INSUFFICIENCY, CHRONIC, RIGHT LEG 09/06/2009    Past Surgical History:  Procedure Laterality Date   ABDOMINAL HYSTERECTOMY      Social History  reports that she has never smoked. She has never used smokeless tobacco. She reports  that she does not drink alcohol and does not use drugs.  Allergies  Allergen Reactions   Ciprofloxacin     Made her feel like [place] was [place],nausea   Macrobid [Nitrofurantoin Macrocrystal]     Made her feel like she was [place],nausea   Sulfacetamide Sodium Itching and Rash   Sulfamethoxazole-Trimethoprim Itching and Rash    History reviewed. No pertinent family history.  Prior to Admission medications   Medication Sig Start Date End Date Taking? Authorizing Provider  allopurinol (ZYLOPRIM) 100 MG tablet TAKE 1 TABLET(100 MG) BY MOUTH DAILY 10/31/21   Nafziger, Tommi Rumps, NP  amLODipine (NORVASC) 2.5 MG tablet TAKE 1 TABLET(2.5 MG) BY MOUTH DAILY 10/26/21   Nafziger, Tommi Rumps, NP  latanoprost (XALATAN) 0.005 % ophthalmic solution INSTILL 1 DROP IN BOTH EYES AT BEDTIME 12/27/17   Dorena Cookey, MD  losartan-hydrochlorothiazide [facility]) 100-12.5 MG tablet TAKE 1 TABLET BY MOUTH DAILY 03/31/21   Nafziger, Tommi Rumps, NP  meloxicam (MOBIC) 7.5 MG tablet TAKE 1 TABLET(7.5 MG) BY MOUTH DAILY 10/26/21   Nafziger, Tommi Rumps, NP  MYRBETRIQ 50 MG TB24 tablet TAKE 1 TABLET(50 MG) BY MOUTH DAILY 10/31/21   Nafziger, Tommi Rumps, NP  potassium chloride SA (KLOR-CON M) 20 MEQ tablet TAKE 1 TABLET(20 MEQ) BY MOUTH DAILY 10/31/21   Nafziger, Tommi Rumps, NP  Vitamin D, Cholecalciferol, 25 MCG (1000 UT) CAPS Take by mouth daily.    [provider]    Physical Exam: Vitals:   02/14/22 0500 02/14/22 0828 02/14/22 0906 02/14/22 1042  BP: 123/69 139/74 139/74 (!) 154/73  Pulse: 79 76  79  Resp: '16 17  16  '$ Temp:  97.8 F (36.6 C)  98 F (36.7 C)  TempSrc:  Oral  Oral  SpO2: 92% 98%  99%  Weight:      Height:        Physical Exam Constitutional:      General: She is not in acute distress.    Appearance: Normal appearance.  HENT:     Head: Normocephalic and atraumatic.     Mouth/Throat:     Mouth: Mucous membranes are moist.     Pharynx: Oropharynx is clear.  Eyes:     Extraocular Movements: Extraocular  movements intact.     Pupils: Pupils are equal, round, and reactive to light.  Cardiovascular:     Rate and Rhythm: Normal rate and regular rhythm.     Pulses: Normal pulses.     Heart sounds: Normal heart sounds.  Pulmonary:     Effort: Pulmonary effort is normal. No respiratory distress.     Breath sounds: Normal breath sounds.  Abdominal:     General: Bowel sounds are normal. There is no distension.     Palpations: Abdomen is soft.     Tenderness: There is no abdominal tenderness.  Musculoskeletal:        General: No swelling or deformity.  Skin:    General: Skin is warm and dry.  Neurological:     General: No focal deficit present.     Mental Status: Mental status is at baseline.    Labs on Admission: I have personally reviewed following labs and imaging studies  CBC: Recent Labs  Lab 02/13/22 1721  WBC 14.3*  HGB 15.1*  HCT 45.7  MCV 90.7  PLT 626    Basic Metabolic Panel: Recent Labs  Lab 02/13/22 1721  NA 139  K 3.9  CL 102  CO2 24  GLUCOSE 145*  BUN 21  CREATININE 0.86  CALCIUM 9.8    GFR: Estimated Creatinine Clearance: 56.3 mL/min (by C-G formula based on SCr of 0.86 mg/dL).  Liver Function Tests: No results for input(s): "AST", "ALT", "ALKPHOS", "BILITOT", "PROT", "ALBUMIN" in the last 168 hours.  Urine analysis:    Component Value Date/Time   COLORURINE yellow 06/11/2008 0920   APPEARANCEUR Clear 06/11/2008 0920   LABSPEC <1.005 06/11/2008 0920   PHURINE 5.0 06/11/2008 0920   HGBUR negative 06/11/2008 0920   BILIRUBINUR neg 09/11/2021 1548   PROTEINUR Negative 09/11/2021 1548   UROBILINOGEN negative (A) 09/11/2021 1548   UROBILINOGEN 0.2 06/11/2008 0920   NITRITE neg 09/11/2021 1548   NITRITE negative 06/11/2008 0920   LEUKOCYTESUR Large (3+) (A) 09/11/2021 1548    Radiological Exams on Admission: CT Angio Chest PE W and/or Wo Contrast  Result Date: 02/14/2022 CLINICAL DATA:  Shortness of breath, evaluate for PE EXAM: CT  ANGIOGRAPHY CHEST WITH CONTRAST TECHNIQUE: Multidetector CT imaging of the chest was performed using the standard protocol during bolus administration of intravenous contrast. Multiplanar CT image reconstructions and MIPs were obtained to evaluate the vascular anatomy. RADIATION DOSE REDUCTION: This exam was performed according to the departmental dose-optimization program which includes automated exposure control, adjustment of the mA and/or kV according to patient size and/or use of iterative reconstruction technique. CONTRAST:  83m OMNIPAQUE IOHEXOL 350 MG/ML SOLN COMPARISON:  Chest radiograph dated 02/13/2022 FINDINGS: Cardiovascular: Satisfactory opacification of the bilateral pulmonary arteries to the segmental level. Lobar/segmental left upper and lower lobe pulmonary emboli. Segmental and subsegmental right upper, middle, and lower lobe pulmonary emboli. Overall  clot burden is moderate. The heart is normal in size. No pericardial effusion. RV to LV ratio 0.71. No evidence of right heart strain. Although not tailored for evaluation of the thoracic aorta, there is no evidence thoracic aortic aneurysm or dissection. Coronary atherosclerosis of the LAD and right coronary artery. Mediastinum/Nodes: No suspicious mediastinal lymphadenopathy. Lungs/Pleura: Mild dependent atelectasis in the bilateral lower lobes. No focal consolidation. No suspicious pulmonary nodules. No pleural effusion or pneumothorax. Upper Abdomen: Visualized upper abdomen is notable for prior cholecystectomy, vascular calcifications, and a 9.3 cm fluid density left upper pole renal cyst (series 4/image 129), benign (Bosniak I). No follow-up is recommended. Musculoskeletal: Degenerative changes of the visualized thoracolumbar spine. Review of the MIP images confirms the above findings. IMPRESSION: Bilateral lobar/segmental pulmonary emboli, as described above. Overall clot burden is moderate. No evidence of right heart strain. Critical  Value/emergent results were called by telephone at the time of interpretation on 02/14/2022 at 2:42 am to provider Campbell Stall , who verbally acknowledged these results. Aortic Atherosclerosis (ICD10-I70.0). Electronically Signed   By: Julian Hy M.D.   On: 02/14/2022 02:44   DG Chest 2 View  Result Date: 02/13/2022 CLINICAL DATA:  Short of breath EXAM: CHEST - 2 VIEW COMPARISON:  CT chest 10/18/2004 FINDINGS: The heart size and mediastinal contours are within normal limits. Both lungs are clear. The visualized skeletal structures are unremarkable. IMPRESSION: No active cardiopulmonary disease. Electronically Signed   By: Franchot Gallo M.D.   On: 02/13/2022 17:58    EKG: Independently reviewed.  Sinus tachycardia at 110 bpm.  Low voltage V2.  QTc prolonged at 560.  Nonspecific ST changes.   Assessment/Plan Principal Problem:   Acute pulmonary embolism (HCC) Active Problems:   GOUT   OBESITY NOS   Essential hypertension   Prolonged QT interval   Acute PE History of DVT > Patient presenting with shortness of breath sudden onset yesterday. > Noted to have elevated D-dimer in the ED and CTA showing bilateral pulmonary emboli with moderate clot burden but no evidence of right heart strain. > Previous DVT noted in 2009, treated for 6 months with Eliquis. > Given recurrent nonprovoked clot, patient will need to be on anticoagulation long-term and less develops contraindication > Denies provoking factors such as travel, surgery, etc. - Monitor on telemetry - Started on heparin in the ED, will transition to Eliquis - Echocardiogram  Leukocytosis > Presumed reactive in the setting of pulmonary embolism.  Continue to trend. - Trend CBC  Hypertension - Continue home amlodipine, losartan, hydrochlorothiazide  Gout - Continue home allopurinol  Obesity Venous stasis - Noted  Prolonged QTc > Noted on EKG - Recheck in the a.m. - Avoid QT prolonging medications  DVT  prophylaxis: Heparin, transition to Eliquis Code Status:   Full, discussed with patient. Family Communication:  None on admission  Disposition Plan:   Patient is from:  Home  Anticipated DC to:  Home  Anticipated DC date:  1 to 2 days  Anticipated DC barriers: None  Consults called:  None Admission status:  Inpatient, telemetry  Severity of Illness: The appropriate patient status for this patient is INPATIENT. Inpatient status is judged to be reasonable and necessary in order to provide the required intensity of service to ensure the patient's safety. The patient's presenting symptoms, physical exam findings, and initial radiographic and laboratory data in the context of their chronic comorbidities is felt to place them at high risk for further clinical deterioration. Furthermore, it is not anticipated that  the patient will be medically stable for discharge from the hospital within 2 midnights of admission.   * I certify that at the point of admission it is my clinical judgment that the patient will require inpatient hospital care spanning beyond 2 midnights from the point of admission due to high intensity of service, high risk for further deterioration and high frequency of surveillance required.Marcelyn Bruins MD [facility]  How to contact the [facility] Attending or Consulting provider [place] or covering provider during after hours [place], for this patient?   Check the care team in [facility] and look for a) attending/consulting [facility] provider listed and b) the [facility] team listed Log into [email or web address] and use [place]'s universal password to access. If you do not have the password, please contact the hospital operator. Locate the [facility] provider you are looking for under [facility] and page to a number that you can be directly reached. If you still have difficulty reaching the provider, please page the [facility] (Director on Call) for the Hospitalists listed on amion for  assistance.  02/14/2022, 3:00 PM

## 2022-02-14 NOTE — ED Notes (Signed)
Ambulated pt in hallway HR 102 Sats 96-97%

## 2022-02-14 NOTE — Chronic Care Management (AMB) (Signed)
Created in error

## 2022-02-14 NOTE — Progress Notes (Signed)
ANTICOAGULATION CONSULT NOTE - [place] for Heparin  Indication: pulmonary embolus  Allergies  Allergen Reactions   Ciprofloxacin     Made her feel like she was [place],nausea   Macrobid [Nitrofurantoin Macrocrystal]     Made her feel like she was [place],nausea   Sulfacetamide Sodium Itching and Rash   Sulfamethoxazole-Trimethoprim Itching and Rash    Patient Measurements: Height: 5' 4.5" (163.8 cm) Weight: 99.3 kg (218 lb 14.7 oz) IBW/kg (Calculated) : 55.85  Vital Signs: Temp: 98 F (36.7 C) (12/20 1042) Temp Source: Oral (12/20 1042) BP: 154/73 (12/20 1042) Pulse Rate: 79 (12/20 1042)  Labs: Recent Labs    02/13/22 1721 02/13/22 2200 02/14/22 1150  HGB 15.1*  --   --   HCT 45.7  --   --   PLT 309  --   --   HEPARINUNFRC  --   --  0.45  CREATININE 0.86  --   --   TROPONINIHS 13 20*  --      Estimated Creatinine Clearance: 56.3 mL/min (by C-G formula based on SCr of 0.86 mg/dL).   Medical History: Past Medical History:  Diagnosis Date   COLONIC POLYPS, HX OF 11/28/2006   DVT 03/10/2007   Edema 02/21/2007   ESOPHAGITIS NOS 09/27/2006   GOUT 06/04/2007   HIP PAIN, LEFT 01/30/2007   HYPERTENSION 11/28/2006   OBESITY NOS 09/27/2006   OSTEOARTHRITIS 11/28/2006   OSTEOARTHROSIS, GENERALIZED, UNSPC SITE 09/27/2006   PHLEBITIS&THROMBOPHLEBITIS LOWER EXTREM UNSPEC 03/07/2007   STATE, SYMPTOMATIC MENOPAUSE/FEM CLIMACTERIC 09/27/2006   VENOUS INSUFFICIENCY, CHRONIC, RIGHT LEG 09/06/2009     Assessment: 86 y/o F in the ED at [facility] with shortness of breath, new onset PE via CT angio. Pt has a remote history of lower extremity DVT and patient has been off of anticoagulation for years prior to admission. Pharmacy consulted to initiate and manage heparin infusion for treatment of PE.  Hgb 15.1, Plt 309 Heparin level, 0.45, therapeutic Current heparin infusion rate: 1100 units/hr No s/sx of bleeding reported by RN  Goal of Therapy:   Heparin level 0.3-0.7 units/ml Monitor platelets by anticoagulation protocol: Yes   Plan:  Continue heparin infusion at 1100 units/hr Check heparin level in 8 hours Monitor daily CBC, heparin level, and for s/sx of bleeding.   Luisa Hart, PharmD, BCPS Clinical Pharmacist 02/14/2022 1:44 PM   Please refer to [facility] for pharmacy phone number

## 2022-02-14 NOTE — ED Notes (Signed)
O2 Sat 97% HR 107 when ambulating.

## 2022-02-14 NOTE — Hospital Course (Signed)
This is a 86 year old female with history of hypertension, diabetes mellitus, overactive bladder, and idiopathic gout. Additionally, patient has a history of LE DVT in the remote past, off Eliquis for years.  Patient presented to [facility] with complaint of shortness of breath x 1 day.  Shortness of breath worse with ambulation.  Patient has trace LE bilateral  swelling.  There is no reports of chest pain, fevers , chills or signs of infection.  In the ER CT chest is positive for bilateral pulmonary arteries to the segmental level. Lobar/segmental left upper and lower lobe pulmonary emboli. Segmental and subsegmental right upper, middle, and lower lobe pulmonary emboli. Overall clot burden is moderate.  In the ER, patient's is stable on room air.  She has been started on heparin drip.  Transfer requested.

## 2022-02-14 NOTE — ED Notes (Signed)
Pt aware that we will be doing a PE study

## 2022-02-15 ENCOUNTER — Inpatient Hospital Stay (HOSPITAL_COMMUNITY): Payer: PPO

## 2022-02-15 ENCOUNTER — Other Ambulatory Visit (HOSPITAL_COMMUNITY): Payer: Self-pay

## 2022-02-15 DIAGNOSIS — I2699 Other pulmonary embolism without acute cor pulmonale: Secondary | ICD-10-CM | POA: Diagnosis not present

## 2022-02-15 DIAGNOSIS — I2694 Multiple subsegmental pulmonary emboli without acute cor pulmonale: Secondary | ICD-10-CM | POA: Diagnosis not present

## 2022-02-15 LAB — BASIC METABOLIC PANEL
Anion gap: 7 (ref 5–15)
BUN: 24 mg/dL — ABNORMAL HIGH (ref 8–23)
CO2: 28 mmol/L (ref 22–32)
Calcium: 9.3 mg/dL (ref 8.9–10.3)
Chloride: 104 mmol/L (ref 98–111)
Creatinine, Ser: 0.79 mg/dL (ref 0.44–1.00)
GFR, Estimated: >60 mL/min (ref >60)
Glucose, Bld: 122 mg/dL — ABNORMAL HIGH (ref 70–99)
Potassium: 4.4 mmol/L (ref 3.5–5.1)
Sodium: 139 mmol/L (ref 135–145)

## 2022-02-15 LAB — CBC
HCT: 41 % (ref 36.0–46.0)
Hemoglobin: 13.7 g/dL (ref 12.0–15.0)
MCH: 30.2 pg (ref 26.0–34.0)
MCHC: 33.4 g/dL (ref 30.0–36.0)
MCV: 90.3 fL (ref 80.0–100.0)
Platelets: 248 10*3/uL (ref 150–400)
RBC: 4.54 MIL/uL (ref 3.87–5.11)
RDW: 13.4 % (ref 11.5–15.5)
WBC: 11 10*3/uL — ABNORMAL HIGH (ref 4.0–10.5)
nRBC: 0 % (ref 0.0–0.2)

## 2022-02-15 LAB — ECHOCARDIOGRAM COMPLETE
Area-P 1/2: 2.16 cm2
Height: 64.5 in
S' Lateral: 3.2 cm
Weight: 3502.67 oz

## 2022-02-15 MED ORDER — APIXABAN 5 MG PO TABS: ORAL_TABLET | ORAL | 0 refills | Status: DC

## 2022-02-15 MED ORDER — APIXABAN 5 MG PO TABS: 5.0000 mg | ORAL_TABLET | Freq: Two times a day (BID) | ORAL | 2 refills | Status: DC

## 2022-02-15 MED ORDER — ACETAMINOPHEN 325 MG PO TABS: 650.0000 mg | ORAL_TABLET | Freq: Four times a day (QID) | ORAL | Status: AC | PRN

## 2022-02-15 MED ORDER — LATANOPROST 0.005 % OP SOLN
1.0000 [drp] | Freq: Every day | OPHTHALMIC | Status: DC
  Filled 2022-02-15: qty 2.5

## 2022-02-15 MED ORDER — APIXABAN 5 MG PO TABS
ORAL_TABLET | ORAL | 0 refills | Status: DC
  Filled 2022-02-15 (×3): qty 72, 30d supply, fill #0

## 2022-02-15 MED ORDER — APIXABAN 5 MG PO TABS
10.0000 mg | ORAL_TABLET | Freq: Two times a day (BID) | ORAL | 0 refills | Status: DC
  Filled 2022-02-15: qty 24, 6d supply, fill #0

## 2022-02-15 NOTE — Progress Notes (Signed)
  Echocardiogram 2D Echocardiogram has been performed.  Kristina Allison 02/15/2022, 10:55 AM

## 2022-02-15 NOTE — Discharge Summary (Signed)
DISCHARGE SUMMARY  Kristina Allison  MR#: [id number]  DOB:May 05, 1935  Date of Admission: 02/13/2022 Date of Discharge: 02/15/2022  Attending Physician:Bernardino Dowell Hennie Duos, MD  Patient's WUJ:[id number], Tommi Rumps, NP  Consults: none   Disposition: D/C home   Follow-up Appts:  Follow-up Information     Nafziger, Tommi Rumps, NP Follow up in 7 day(s).   Specialty: Family Medicine Contact information: [place] [address] [phone]                 Tests Needing Follow-up: -assess tolerance to newly prescribed Eliquis   Discharge Diagnoses: Acute PE - history of DVT 2009 HTN DM2 Gout Obesity - Body mass index is 37 kg/m. Chronic venous stasis  Initial presentation: 86 year old with a history of chronic venous stasis, obesity, gout, HTN, and spontaneous DVT in 2009 who presented to the ER with the acute onset of shortness of breath.  Her D-dimer was found to be elevated at 12 and a CTa of the chest revealed bilateral lobar/segmental pulmonary emboli with moderate clot burden and no suggestion of right heart strain.   Hospital Course:  Acute PE -history of DVT 2009 Previously treated with 6 months of Eliquis for unprovoked DVT - this now represents her second episode of unprovoked clotting (though her obesity is a risk factor) - denies other provoking factors such as travel or surgery - transitioned from heparin to Eliquis - TTE w/o evidence of signif heart strain - clinically stable - safe for d/c home on Eliquis    HTN Continue usual home medications   DM2 No change in usual tx regimen   Gout Well compensated at present -continue allopurinol   Obesity - Body mass index is 37 kg/m.   Chronic venous stasis Stable at present  Allergies as of 02/15/2022       Reactions   Cipro [ciprofloxacin Hcl] Nausea Only, Other (See Comments)   Made [place] feel [place] was [place]   Macrobid [nitrofurantoin Macrocrystal] Nausea Only, Other (See  Comments)   Made pt feel like she was [place]"   Sulfacetamide Sodium Itching, Rash   Sulfamethoxazole-trimethoprim Itching, Rash        Medication List     STOP taking these medications    meloxicam 7.5 MG tablet Commonly known as: MOBIC       TAKE these medications    acetaminophen 325 MG tablet Commonly known as: TYLENOL Take 2 tablets (650 mg total) by mouth every 6 (six) hours as needed for mild pain (or Fever >/= 101).   allopurinol 100 MG tablet Commonly known as: ZYLOPRIM TAKE 1 TABLET(100 MG) BY MOUTH DAILY What changed: See the new instructions.   amLODipine 2.5 MG tablet Commonly known as: NORVASC TAKE 1 TABLET(2.5 MG) BY MOUTH DAILY What changed:  how much to take how to take this when to take this additional instructions   apixaban 5 MG Tabs tablet Commonly known as: ELIQUIS Take 2 tablets by mouth two times daily for 6 days. Then on 12/27, decrease to 1 tablet by mouth two times daily.   latanoprost 0.005 % ophthalmic solution Commonly known as: XALATAN INSTILL 1 DROP IN BOTH EYES AT BEDTIME What changed: See the new instructions.   losartan-hydrochlorothiazide 100-12.5 MG tablet Commonly known as: HYZAAR TAKE 1 TABLET BY MOUTH DAILY What changed: when to take this   Myrbetriq 50 MG Tb24 tablet Generic drug: mirabegron ER TAKE 1 TABLET(50 MG) BY MOUTH DAILY What changed: See the new instructions.  potassium chloride SA 20 MEQ tablet Commonly known as: KLOR-CON M TAKE 1 TABLET(20 MEQ) BY MOUTH DAILY What changed: See the new instructions.   VITAMIN D-3 PO Take 1 capsule by mouth in the morning.        Day of Discharge BP (!) 157/71 (BP Location: Right Arm)   Pulse 66   Temp 97.8 F (36.6 C) (Oral)   Resp 17   Ht 5' 4.5" (1.638 m)   Wt 99.3 kg   SpO2 97%   BMI 37.00 kg/m   Physical Exam: General: No acute respiratory distress Lungs: Clear to auscultation bilaterally without wheezes or crackles Cardiovascular:  Regular rate and rhythm without murmur gallop or rub normal S1 and S2 Abdomen: Nontender, nondistended, soft, bowel sounds positive, no rebound, no ascites, no appreciable mass Extremities: No significant cyanosis, clubbing, or edema bilateral lower extremities  Basic Metabolic Panel: Recent Labs  Lab 02/13/22 1721 02/15/22 0323  NA 139 139  K 3.9 4.4  CL 102 104  CO2 24 28  GLUCOSE 145* 122*  BUN 21 24*  CREATININE 0.86 0.79  CALCIUM 9.8 9.3   CBC: Recent Labs  Lab 02/13/22 1721 02/14/22 1150 02/15/22 0323  WBC 14.3* 10.6* 11.0*  HGB 15.1* 13.9 13.7  HCT 45.7 40.3 41.0  MCV 90.7 89.4 90.3  PLT 309 274 248    Time spent in discharge (includes decision making & examination of pt): 30 minutes  02/15/2022, 5:30 PM   Cherene Altes, MD [facility] Office  [phone]

## 2022-02-15 NOTE — Care Plan (Signed)
Discharge instructions completed with patient.  RN expressed to patient to complete 30 day supply of eliquis before filling script.  Discussed medication dosing and times.  INT removed with no bleeding noted.  NADN.  Removed telemetry.  Patient will dress and proceed to entrance for discharge.  Patient verbalized understanding of all instructions, denies additional needs. Edythe Clarity, RN

## 2022-02-16 ENCOUNTER — Telehealth: Payer: Self-pay | Admitting: Adult Health

## 2022-02-16 ENCOUNTER — Telehealth: Payer: Self-pay

## 2022-02-16 NOTE — Telephone Encounter (Signed)
Routing to provider to make sure time slot is appropriate.

## 2022-02-16 NOTE — Telephone Encounter (Signed)
Pt was d/c for [facility] and need post hos fup in 7 days. Patient has been sch for 02-22-2022

## 2022-02-16 NOTE — Patient Outreach (Signed)
  Care Coordination TOC Note Transition Care Management Follow-up Telephone Call Date of discharge and from where: 02/15/22-[facility]  x: "Acute PE,prolonged QT interval" How have you been since you were released from the hospital? Patient states she is doing well. Her only issue is that she is still weak but knows its to be expected and will improve over the next few days. Denies any cardiac or resp issues. She rested well last night. She had ate a good dinner and breakfast.  Any questions or concerns? No  Items Reviewed: Did the pt receive and understand the discharge instructions provided? Yes  Medications obtained and verified? Yes  Other? No  Any new allergies since your discharge? No  Dietary orders reviewed? Yes Do you have support at home? Yes   Home Care and Equipment/Supplies: Were home health services ordered? not applicable If so, what is the name of the agency? N/A  Has the agency set up a time to come to the patient's home? not applicable Were any new equipment or medical supplies ordered?  No What is the name of the medical supply agency? N/A Were you able to get the supplies/equipment? not applicable Do you have any questions related to the use of the equipment or supplies? No  Functional Questionnaire: (I = Independent and D = Dependent) ADLs: I  Bathing/Dressing- I  Meal Prep- I  Eating- I  Maintaining continence- I  Transferring/Ambulation- I  Managing Meds- I  Follow up appointments reviewed:  PCP Hospital f/u appt confirmed? Yes  Scheduled to see Janna Arch on 02/22/22. [facility] f/u appt confirmed?  N/A   Are transportation arrangements needed? No  If their condition worsens, is the pt aware to call PCP or go to the Emergency Dept.? Yes Was the patient provided with contact information for the PCP's office or ED? Yes Was to pt encouraged to call back with questions or concerns? Yes  SDOH assessments and interventions completed:    Yes SDOH Interventions Today    Flowsheet Row Most Recent Value  SDOH Interventions   Food Insecurity Interventions Intervention Not Indicated  Transportation Interventions Intervention Not Indicated       Care Coordination Interventions:  Education provided    Encounter Outcome:  Pt. Visit Completed    Enzo Montgomery, RN,BSN,CCM [place] Management Telephonic Care Management Coordinator Direct Phone: [phone] Toll Free: [phone] Fax: [phone]

## 2022-02-22 ENCOUNTER — Ambulatory Visit (INDEPENDENT_AMBULATORY_CARE_PROVIDER_SITE_OTHER): Payer: PPO | Admitting: Adult Health

## 2022-02-22 ENCOUNTER — Encounter: Payer: Self-pay | Admitting: Adult Health

## 2022-02-22 VITALS — BP 130/80 | HR 80 | Temp 97.9°F | Ht 64.5 in | Wt 221.0 lb

## 2022-02-22 DIAGNOSIS — M1000 Idiopathic gout, unspecified site: Secondary | ICD-10-CM

## 2022-02-22 DIAGNOSIS — I2699 Other pulmonary embolism without acute cor pulmonale: Secondary | ICD-10-CM

## 2022-02-22 DIAGNOSIS — E118 Type 2 diabetes mellitus with unspecified complications: Secondary | ICD-10-CM

## 2022-02-22 DIAGNOSIS — E669 Obesity, unspecified: Secondary | ICD-10-CM

## 2022-02-22 DIAGNOSIS — I10 Essential (primary) hypertension: Secondary | ICD-10-CM | POA: Diagnosis not present

## 2022-02-22 NOTE — Progress Notes (Signed)
Subjective:    Patient ID: Kristina Allison, female    DOB: 03-Jul-1935, 86 y.o.   MRN: [id number]  HPI 86 year old female who  has a past medical history of COLONIC POLYPS, HX OF (11/28/2006), DVT (03/10/2007), Edema (02/21/2007), ESOPHAGITIS NOS (09/27/2006), GOUT (06/04/2007), HIP PAIN, LEFT (01/30/2007), HYPERTENSION (11/28/2006), OBESITY NOS (09/27/2006), OSTEOARTHRITIS (11/28/2006), OSTEOARTHROSIS, GENERALIZED, UNSPC SITE (09/27/2006), PHLEBITIS&THROMBOPHLEBITIS LOWER EXTREM UNSPEC (03/07/2007), STATE, SYMPTOMATIC MENOPAUSE/FEM CLIMACTERIC (09/27/2006), and VENOUS INSUFFICIENCY, CHRONIC, RIGHT LEG (09/06/2009).  She presents to the office today for TCM visit   Admit Date 02/13/2022 Discharge Date 02/15/2022  She presented to the emergency room with acute onset of shortness of breath.  Her D-dimer was found to be elevated at 12.  CTA of the chest revealed bilateral lobe far/segmental pulmonary emboli with moderate clot burden and no suggestion of right heart strain  Hospital Course   Acute PE  -History of DVT in 2009.  She was previously treated with 6 months of Eliquis for unprovoked DVT.  She was transition from heparin to Eliquis-TTE without evidence of significant heart strain.  She was charged home on Eliquis  Hypertension  -Continued on home medications of Hyzaar 100-12.5 mg  DM Type 2  -Diet controlled  Gout  - continued on allopurinol   Obesity  -Encouraged weight loss measures  Today she does report that she feels better somewhat but still becomes SOB with exertion and is still fatigued. She has not had any issued with bleeding    Review of Systems  Constitutional:  Positive for fatigue.  HENT: Negative.    Eyes: Negative.   Respiratory:  Positive for shortness of breath.   Cardiovascular: Negative.   Gastrointestinal: Negative.   Endocrine: Negative.   Genitourinary: Negative.   Musculoskeletal: Negative.   Skin: Negative.   Allergic/Immunologic: Negative.   Neurological:  Negative.   Hematological: Negative.   Psychiatric/Behavioral: Negative.     Past Medical History:  Diagnosis Date   COLONIC POLYPS, HX OF 11/28/2006   DVT 03/10/2007   Edema 02/21/2007   ESOPHAGITIS NOS 09/27/2006   GOUT 06/04/2007   HIP PAIN, LEFT 01/30/2007   HYPERTENSION 11/28/2006   OBESITY NOS 09/27/2006   OSTEOARTHRITIS 11/28/2006   OSTEOARTHROSIS, GENERALIZED, UNSPC SITE 09/27/2006   PHLEBITIS&THROMBOPHLEBITIS LOWER EXTREM UNSPEC 03/07/2007   STATE, SYMPTOMATIC MENOPAUSE/FEM CLIMACTERIC 09/27/2006   VENOUS INSUFFICIENCY, CHRONIC, RIGHT LEG 09/06/2009    Social History   Socioeconomic History   Marital status: Widowed    Spouse name: Not on file   Number of children: 7   Years of education: 12   Highest education level: High school graduate  Occupational History   Occupation: retired  Tobacco Use   Smoking status: Never   Smokeless tobacco: Never  Substance and Sexual Activity   Alcohol use: No   Drug use: No   Sexual activity: Not on file  Other Topics Concern   Not on file  Social History Narrative   Widowed   [place] 2 : son lives with her   Likes word searches, reading, television   Social Determinants of Health   Financial Resource Strain: Low Risk  (11/28/2021)   Overall Financial Resource Strain (CARDIA)    Difficulty of Paying Living Expenses: Not very hard  Food Insecurity: No Food Insecurity (02/16/2022)   Hunger Vital Sign    Worried About Running Out of Food in the Last Year: Never true    Ran Out of Food in the Last Year: Never true  Transportation Needs: No  Transportation Needs (02/16/2022)   PRAPARE - Hydrologist (Medical): No    Lack of Transportation (Non-Medical): No  Physical Activity: Inactive (04/27/2021)   Exercise Vital Sign    Days of Exercise per Week: 0 days    Minutes of Exercise per Session: 0 min  Stress: No Stress Concern Present (04/27/2021)   [place]    Feeling of Stress : Not at all  Social Connections: Moderately Integrated (04/27/2021)   Social Connection and Isolation Panel [NHANES]    Frequency of Communication with Friends and Family: More than three times a week    Frequency of Social Gatherings with Friends and Family: More than three times a week    Attends Religious Services: More than 4 times per year    Active Member of [employer] or Organizations: Yes    Attends Archivist Meetings: More than 4 times per year    Marital Status: Widowed  Intimate Partner Violence: Not At Risk (04/27/2021)   Humiliation, Afraid, Rape, and Kick questionnaire    Fear of Current or Ex-Partner: No    Emotionally Abused: No    Physically Abused: No    Sexually Abused: No    Past Surgical History:  Procedure Laterality Date   ABDOMINAL HYSTERECTOMY      History reviewed. No pertinent family history.  Allergies  Allergen Reactions   Cipro [Ciprofloxacin Hcl] Nausea Only and Other (See Comments)    Made pt feel like she was [place]"   Macrobid [Nitrofurantoin Macrocrystal] Nausea Only and Other (See Comments)    Made pt feel like she was [place]"   Sulfacetamide Sodium Itching and Rash   Sulfamethoxazole-Trimethoprim Itching and Rash    Current Outpatient Medications on File Prior to Visit  Medication Sig Dispense Refill   acetaminophen (TYLENOL) 325 MG tablet Take 2 tablets (650 mg total) by mouth every 6 (six) hours as needed for mild pain (or Fever >/= 101).     allopurinol (ZYLOPRIM) 100 MG tablet TAKE 1 TABLET(100 MG) BY MOUTH DAILY (Patient taking differently: Take 100 mg by mouth in the morning.) 90 tablet 3   amLODipine (NORVASC) 2.5 MG tablet TAKE 1 TABLET(2.5 MG) BY MOUTH DAILY (Patient taking differently: Take 2.5 mg by mouth in the morning.) 90 tablet 3   apixaban (ELIQUIS) 5 MG TABS tablet Take 2 tablets by mouth two times daily for 6 days. Then on 12/27, decrease to 1 tablet by mouth two times  daily. 72 tablet 0   Cholecalciferol (VITAMIN D-3 PO) Take 1 capsule by mouth in the morning.     latanoprost (XALATAN) 0.005 % ophthalmic solution INSTILL 1 DROP IN BOTH EYES AT BEDTIME (Patient taking differently: Place 1 drop into both eyes at bedtime.) 10 mL 1   losartan-hydrochlorothiazide (HYZAAR) 100-12.5 MG tablet TAKE 1 TABLET BY MOUTH DAILY (Patient taking differently: Take 1 tablet by mouth in the morning.) 90 tablet 3   MYRBETRIQ 50 MG TB24 tablet TAKE 1 TABLET(50 MG) BY MOUTH DAILY (Patient taking differently: Take 50 mg by mouth in the morning.) 30 tablet 11   potassium chloride SA (KLOR-CON M) 20 MEQ tablet TAKE 1 TABLET(20 MEQ) BY MOUTH DAILY (Patient taking differently: Take 20 mEq by mouth in the morning.) 90 tablet 3   No current facility-administered medications on file prior to visit.    BP 130/80   Pulse 80   Temp 97.9 F (36.6 C) (Oral)  Ht 5' 4.5" (1.638 m)   Wt 221 lb (100.2 kg)   SpO2 98%   BMI 37.35 kg/m       Objective:   Physical Exam Vitals and nursing note reviewed.  Constitutional:      Appearance: Normal appearance.  Cardiovascular:     Rate and Rhythm: Normal rate and regular rhythm.     Pulses: Normal pulses.     Heart sounds: Normal heart sounds.  Pulmonary:     Effort: Pulmonary effort is normal.     Breath sounds: Normal breath sounds.  Abdominal:     General: Abdomen is flat. Bowel sounds are normal.     Palpations: Abdomen is soft.  Musculoskeletal:        General: Normal range of motion.  Skin:    General: Skin is warm and dry.     Capillary Refill: Capillary refill takes less than 2 seconds.  Neurological:     General: No focal deficit present.     Mental Status: She is alert and oriented to person, place, and time.  Psychiatric:        Mood and Affect: Mood normal.        Behavior: Behavior normal.        Thought Content: Thought content normal.        Judgment: Judgment normal.       Assessment & Plan:  1. Acute  pulmonary embolism without acute cor pulmonale, unspecified pulmonary embolism type [facility]) - Reviewed hospital notes, discharge instructions, labs, imaging and medication changes with the patient.  - Advised against NSAIDS  - Follow up with any concern   2. Controlled type 2 diabetes mellitus with complication, without long-term current use of insulin ([place]) - Diet controlled.   3. Essential hypertension - Continue home medications   4. Idiopathic gout, unspecified chronicity, unspecified site Continue with Allopurinol 100 mg daily.   5. Obesity, unspecified classification, unspecified obesity type, unspecified whether serious comorbidity present - Encouraged weight loss through diet and exercise  Dorothyann Peng, NP

## 2022-02-28 NOTE — Chronic Care Management (AMB) (Cosign Needed Addendum)
Chronic Care Management   CCM RN Visit Note   Name: Kristina Allison MRN: [id number] DOB: 10-26-1935  Subjective: Kristina Allison is a 87 y.o. year old female who is a primary care patient of Dorothyann Peng, NP. The patient was referred to the Chronic Care Management team for assistance with care management needs subsequent to provider initiation of CCM services and plan of care.    Today's Visit:  Engaged with patient by telephone for initial visit.       SDOH Interventions Today    Flowsheet Row Most Recent Value  SDOH Interventions   Food Insecurity Interventions Intervention Not Indicated  Housing Interventions Intervention Not Indicated  Transportation Interventions Intervention Not Indicated  Utilities Interventions Intervention Not Indicated  Financial Strain Interventions Intervention Not Indicated  Stress Interventions Intervention Not Indicated  Social Connections Interventions Intervention Not Indicated        Goals Addressed             This Visit's Progress    Goal: CCM (Diabetes) Expected Outcome: Monitor, Self-Manage and Reduce Symptoms of Diabetes       Current Barriers:  Chronic Disease Management support and education needs related to Diabetes  Planned Interventions: Reviewed provider's plan for diabetes management. Reports doing very well. Currently controlled with without medication. A1C is at goal 6.3%. Discussed nutritional intake and importance of complying with a diabetic diet. Reports attempting to adhere to recommended diet. Monitoring intake of carbohydrates and added sugars. Declined need for additional nutritional resources. Discussed importance of completing recommended DM preventive care. Reports completing recommended foot care. Reports eye exam is up to date.  Discussed importance of completing ordered labs as prescribed.  Assessed social determinant of health barriers.  Lab Results  Component Value Date   HGBA1C 6.3 (A) 01/25/2022      Symptom Management: Attend all scheduled provider appointments Continue adhering to diabetic diet Read food labels for fat, fiber, carbohydrates and portion size Wash and dry feet carefully every day Check feet daily for cuts, sores or redness Wear comfortable, cotton socks Wear comfortable, well-fitting shoes Call provider office for new concerns or questions Seek immediate medical attention for worsening symptoms  Follow Up Plan:  Will follow up next month       Goal: CCM (Hypertension) Expected Outcome:  Monitor, Self-Manage And Reduce Symptoms of Hypertension       Current Barriers:  Chronic Disease Management support and education needs related to Hypertension  Planned Interventions: Reviewed plan for hypertension management. Reviewed medications and indications for use. Patient reports taking medications as prescribed. Reports tolerating current regimen. Denies concerns related to medication management. Provided information regarding established blood pressure parameters along with indications for notifying a provider. Reports monitoring routinely at home. Reports readings have been within range. Advised to monitor BP a few times a week if unable to monitor daily and record readings.  Discussed symptoms. Denies chest pain, palpitations, or shortness of breath. Denies recent headaches or visual changes. Discussed compliance with recommended cardiac prudent diet. Encouraged to read nutrition labels, monitor sodium intake, and avoid highly processed foods when possible. Declines current need for additional nutritional resources. Discussed complications of uncontrolled blood pressure.  Reviewed s/sx of heart attack, stroke and worsening symptoms that require immediate medical attention.  Symptom Management: Take medications as prescribed   Attend all scheduled provider appointments Call pharmacy for medication refills 3-7 days in advance of running out of medications Monitor BP  and maintain a log Continue adhering to cardiac  prudent diet and limiting sodium intake Call provider office for new concerns or questions Seek immediate medical attention for worsening symptoms  Follow Up Plan:  Will follow up next month             PLAN: Will follow up next month  [place] Manager/Chronic Care Management [phone]

## 2022-02-28 NOTE — Patient Instructions (Addendum)
Thank you for allowing the Chronic Care Management team to participate in your care. It was great speaking with you!    Following is a copy of the CCM Program Consent:  CCM service includes personalized support from designated clinical staff supervised by the physician, including individualized plan of care and coordination with other care providers 24/7 contact phone numbers for assistance for urgent and routine care needs. Service will only be billed when office clinical staff spend 20 minutes or more in a month to coordinate care. Only one practitioner may furnish and bill the service in a calendar month. The patient may stop CCM services at amy time (effective at the end of the month) by phone call to the office staff. The patient will be responsible for cost sharing (co-pay) or up to 20% of the service fee (after annual deductible is met)  Following is a copy of your full provider care plan:   Goals Addressed             This Visit's Progress    Goal: CCM (Diabetes) Expected Outcome: Monitor, Self-Manage and Reduce Symptoms of Diabetes       Current Barriers:  Chronic Disease Management support and education needs related to Diabetes  Planned Interventions: Reviewed provider's plan for diabetes management. Reports doing very well. Currently controlled with without medication. A1C is at goal 6.3%. Discussed nutritional intake and importance of complying with a diabetic diet. Reports attempting to adhere to recommended diet. Monitoring intake of carbohydrates and added sugars. Declined need for additional nutritional resources. Discussed importance of completing recommended DM preventive care. Reports completing recommended foot care. Reports eye exam is up to date.  Discussed importance of completing ordered labs as prescribed.  Assessed social determinant of health barriers.  Lab Results  Component Value Date   HGBA1C 6.3 (A) 01/25/2022     Symptom Management: Attend all  scheduled provider appointments Continue adhering to diabetic diet Read food labels for fat, fiber, carbohydrates and portion size Wash and dry feet carefully every day Check feet daily for cuts, sores or redness Wear comfortable, cotton socks Wear comfortable, well-fitting shoes Call provider office for new concerns or questions Seek immediate medical attention for worsening symptoms  Follow Up Plan:  Will follow up next month       Goal: CCM (Hypertension) Expected Outcome:  Monitor, Self-Manage And Reduce Symptoms of Hypertension       Current Barriers:  Chronic Disease Management support and education needs related to Hypertension  Planned Interventions: Reviewed plan for hypertension management. Reviewed medications and indications for use. Patient reports taking medications as prescribed. Reports tolerating current regimen. Denies concerns related to medication management. Provided information regarding established blood pressure parameters along with indications for notifying a provider. Reports monitoring routinely at home. Reports readings have been within range. Advised to monitor BP a few times a week if unable to monitor daily and record readings.  Discussed symptoms. Denies chest pain, palpitations, or shortness of breath. Denies recent headaches or visual changes. Discussed compliance with recommended cardiac prudent diet. Encouraged to read nutrition labels, monitor sodium intake, and avoid highly processed foods when possible. Declines current need for additional nutritional resources. Discussed complications of uncontrolled blood pressure.  Reviewed s/sx of heart attack, stroke and worsening symptoms that require immediate medical attention.  Symptom Management: Take medications as prescribed   Attend all scheduled provider appointments Call pharmacy for medication refills 3-7 days in advance of running out of medications Monitor BP and maintain a log  Continue  adhering to cardiac prudent diet and limiting sodium intake Call provider office for new concerns or questions Seek immediate medical attention for worsening symptoms  Follow Up Plan:  Will follow up next month           Ms. Bailey verbalized understanding of instructions, educational materials, and care plan provided. Agreed to receive a mailed copy of care plan.  A member of the care management team will follow up next month.   Horris Latino RN Care Manager/Chronic Care Management [phone]

## 2022-02-28 NOTE — Plan of Care (Addendum)
Chronic Care Management Provider Comprehensive Care Plan     Name: Kristina Allison MRN: [id number] DOB: [birth year]  Referral to Chronic Care Management (CCM) services was placed by Provider:  Dorothyann Peng, NP on Date: 01/09/22.  Chronic Condition 1: HTN Provider Assessment and Plan  Essential hypertension - Well controlled. No change in medications   Expected Outcome/Goals Addressed This Visit (Provider CCM goals/Provider Assessment and plan  Goal: CCM (Hypertension) Expected Outcome:  Monitor, Self-Manage And Reduce Symptoms of Hypertension  Symptom Management Condition 1: Take medications as prescribed   Attend all scheduled provider appointments Call pharmacy for medication refills 3-7 days in advance of running out of medications Monitor BP and maintain a log Continue adhering to cardiac prudent diet and limiting sodium intake Call provider office for new concerns or questions Seek immediate medical attention for worsening symptoms     Chronic Condition 2: DM Provider Assessment and Plan   Controlled type 2 diabetes mellitus with complication, without long-term current use of insulin (HCC)   - POC HgB A1c- 6.3 - has improved - Follow up in 3 months     Expected Outcome/Goals Addressed This Visit (Provider CCM goals/Provider Assessment and plan  Goal: CCM (Diabetes) Expected Outcome:  Monitor, Self-Manage And Reduce Symptoms of Diabetes  Symptom Management Condition 2: Attend all scheduled provider appointments Continue adhering to diabetic diet Read food labels for fat, fiber, carbohydrates and portion size Wash and dry feet carefully every day Check feet daily for cuts, sores or redness Wear comfortable, cotton socks Wear comfortable, well-fitting shoes Call provider office for new concerns or questions Seek immediate medical attention for worsening symptoms   Patient Active Problem List   Diagnosis Date Noted   Acute pulmonary embolism ([place]) 02/14/2022    Prolonged QT interval 02/14/2022   Unilateral primary osteoarthritis, left knee 11/16/2019   Unilateral primary osteoarthritis, right knee 11/16/2019   Impingement syndrome of right shoulder 03/13/2018   Venous (peripheral) insufficiency 09/06/2009   GOUT 06/04/2007   DVT 03/10/2007   PHLEBITIS&THROMBOPHLEBITIS LOWER EXTREM UNSPEC 03/07/2007   Essential hypertension 11/28/2006   Osteoarthritis 11/28/2006   History of colonic polyps 11/28/2006   OBESITY NOS 09/27/2006   ESOPHAGITIS NOS 09/27/2006   STATE, SYMPTOMATIC MENOPAUSE/FEM CLIMACTERIC 09/27/2006     Cognitive Assessment Identity Confirmed: : DOB; Name Cognitive Status: Normal   Functional Assessment Hearing Difficulty or Deaf: no Wear Glasses or Blind: yes Vision Management: Wears Glasses Concentrating, Remembering or Making Decisions Difficulty (CP): no Difficulty Communicating: no Difficulty Eating/Swallowing: no Walking or Climbing Stairs Difficulty: no Dressing/Bathing Difficulty: no Doing Errands Independently Difficulty (such as shopping) (CP): no Change in Functional Status Since Onset of Current Illness/Injury: no   Caregiver Assessment  Primary Source of Support/Comfort: child(ren) Name of Support/Comfort Primary Source: [place] and [place] in Home: child(ren), adult Name(s) of People in Home: [place] if Needed: none Primary Roles/Responsibilities: caregiver for other(s) (Reports caring for adult mentally disabled son)    Planned Interventions  HTN Reviewed plan for hypertension management. Reviewed medications and indications for use. Patient reports taking medications as prescribed. Reports tolerating current regimen. Denies concerns related to medication management. Provided information regarding established blood pressure parameters along with indications for notifying a provider. Reports monitoring routinely at home. Reports readings have been within range. Advised to  monitor BP a few times a week if unable to monitor daily and record readings.  Discussed symptoms. Denies chest pain, palpitations, or shortness of breath. Denies recent headaches or visual changes.  Discussed compliance with recommended cardiac prudent diet. Encouraged to read nutrition labels, monitor sodium intake, and avoid highly processed foods when possible. Declines current need for additional nutritional resources. Discussed complications of uncontrolled blood pressure.  Reviewed s/sx of heart attack, stroke and worsening symptoms that require immediate medical attention. DM Reviewed provider's plan for diabetes management. Reports doing very well. Currently controlled with without medication. A1C is at goal 6.3%. Discussed nutritional intake and importance of complying with a diabetic diet. Reports attempting to adhere to recommended diet. Monitoring intake of carbohydrates and added sugars. Declined need for additional nutritional resources. Discussed importance of completing recommended DM preventive care. Reports completing recommended foot care. Reports eye exam is up to date.  Discussed importance of completing ordered labs as prescribed.  Assessed social determinant of health barriers.     Interaction and coordination with outside resources, practitioners, and providers See CCM Referral  Care Plan: Will provide printed copy

## 2022-03-08 DIAGNOSIS — H401131 Primary open-angle glaucoma, bilateral, mild stage: Secondary | ICD-10-CM | POA: Diagnosis not present

## 2022-03-21 ENCOUNTER — Ambulatory Visit (INDEPENDENT_AMBULATORY_CARE_PROVIDER_SITE_OTHER): Payer: PPO

## 2022-03-21 DIAGNOSIS — E118 Type 2 diabetes mellitus with unspecified complications: Secondary | ICD-10-CM

## 2022-03-21 DIAGNOSIS — I10 Essential (primary) hypertension: Secondary | ICD-10-CM

## 2022-03-27 NOTE — Chronic Care Management (AMB) (Signed)
Chronic Care Management   CCM RN Visit Note   Name: Kristina Allison MRN: [id number] DOB: 08-Jul-1935  Subjective: Kristina Allison is a 87 y.o. year old female who is a primary care patient of Dorothyann Peng, NP. The patient was referred to the Chronic Care Management team for assistance with care management needs subsequent to provider initiation of CCM services and plan of care.    Today's Visit:  Engaged with patient by telephone for follow up visit.        Goals Addressed             This Visit's Progress    Goal: CCM (Diabetes) Expected Outcome: Monitor, Self-Manage and Reduce Symptoms of Diabetes       Current Barriers:  Chronic Disease Management support and education needs related to Diabetes  Planned Interventions: Reviewed provider's plan for diabetes management. Reports doing very well. Currently controlled with without medication. A1C is at goal 6.3%. Discussed nutritional intake and importance of complying with a diabetic diet. Reports attempting to adhere to recommended diet. Monitoring intake of carbohydrates and added sugars. Declined need for additional nutritional resources. Discussed importance of completing recommended DM preventive care. Reports completing recommended foot care. Reports eye exam is up to date.  Discussed importance of completing ordered labs as prescribed.  Assessed social determinant of health barriers. Update on 03/21/22: No changes  Lab Results  Component Value Date   HGBA1C 6.3 (A) 01/25/2022      Symptom Management: Attend all scheduled provider appointments Continue adhering to diabetic diet Read food labels for fat, fiber, carbohydrates and portion size Wash and dry feet carefully every day Check feet daily for cuts, sores or redness Wear comfortable, cotton socks Wear comfortable, well-fitting shoes Call provider office for new concerns or questions Seek immediate medical attention for worsening symptoms  Follow Up Plan:   Will follow up next month       Goal: CCM (Hypertension) Expected Outcome:  Monitor, Self-Manage And Reduce Symptoms of Hypertension       Current Barriers:  Chronic Disease Management support and education needs related to Hypertension   Planned Interventions: Reviewed plan for hypertension management. Continues taking medications s prescribed. Will update the care management team if she requires assistance d/t prescription cost. Discussed updates since hospital admission on 02/13/22 d/t Acute PE. Reports doing well since hospital discharge. Discussed importance of discontinuing meloxicam as instructed. Started Eliquis 5 mg.  Thorough discussion regarding bleeding risks and need to avoid NSAIDs. Discussed importance of seeking immediate medical attention if she experiences a head injury. Reviewed blood pressure readings. Reports monitoring at home as advised. Reports readings have been within range. Recalls one elevated reading on the day of a scheduled clinic visit but notes reading was within range when BP was repeated. Reviewed established parameters and indications for notifying a provider. Reviewed symptoms. Denies chest pain, palpitations, or shortness of breath. Denies recent headaches or visual changes. Reviewed s/sx of heart attack, stroke and worsening symptoms that require immediate medical attention.  BP Readings from Last 3 Encounters:  02/22/22 130/80  02/15/22 (!) 157/71  01/25/22 130/80    Symptom Management: Take medications exactly as prescribed   Attend all scheduled provider appointments Call pharmacy for medication refills 3-7 days in advance of running out of medications Monitor BP and maintain a log Continue adhering to cardiac prudent diet and limiting sodium intake Call provider office for new concerns or questions Seek immediate medical attention for worsening symptoms or if you  experience a head injury.  Follow Up Plan:  Will follow up next month           PLAN: Will follow up next month   [place] Manager/Chronic Care Management [phone]

## 2022-03-28 DIAGNOSIS — E1159 Type 2 diabetes mellitus with other circulatory complications: Secondary | ICD-10-CM | POA: Diagnosis not present

## 2022-03-28 DIAGNOSIS — I10 Essential (primary) hypertension: Secondary | ICD-10-CM | POA: Diagnosis not present

## 2022-04-13 ENCOUNTER — Telehealth: Payer: Self-pay

## 2022-04-13 NOTE — Progress Notes (Signed)
Care Management & Coordination Services Pharmacy Team  Reason for Encounter: Hypertension  Contacted patient to discuss hypertension disease state. Spoke with patient on 04/13/2022   Current antihypertensive regimen:  Amlodipine 2.5 mg daily Losartan HCTZ 100/12.5 mg daily Patient verbally confirms she is taking the above medications as directed. Yes  How often are you checking your Blood Pressure? Patient tries to check her blood pressure once or twice a week.   she checks her blood pressure in the morning before taking her medication.  Current home BP readings:  DATE:             BP               PULSE 03/21/2022 129/62  92 04/05/2022 128/62  102  Wrist or arm cuff: Arm cuff  OTC medications including pseudoephedrine or NSAIDs? Patient denies, she only uses Tylenol.   Any readings above 180/100? Patient denies  What recent interventions/DTPs have been made by any provider to improve Blood Pressure control since last CPP Visit: No recent interventions noted.   Any recent hospitalizations or ED visits since last visit with CPP? Admitted to [facility] on 02/13/2022 due to acute pulmonary embolism and additional concerns. Discharge date was 02/15/2022.  What diet changes have been made to improve Blood Pressure Control?  Patient follows no specific diet Breakfast  - patient will have coffee and a peanutbutter and jelly sandwich Lunch - patient will have a sandwich, soup or salad Dinner - patient will have a meal with meat and vegetable Snack - patient will have chips and cookies Caffeine intake - patient will have 1 coffee daily Salt intake - patient is not adding salt  What exercise is being done to improve your Blood Pressure Control?  Patient will walk around her yard or house, house work.  Patient states she is not walking as much as she used to however, is feeling better and her breathing is becoming easier so she is starting to be able to walk a little  more.   Adherence Review: Is the patient currently on ACE/ARB medication? Yes Does the patient have >5 day gap between last estimated fill dates? No  Care Gaps: AWV - completed 04/27/2021, scheduled 04/30/2022 Next appointment - scheduled 06/26/2022 Urine ACR - never done Tdap - overdue Covid - overdue Shingrix - overdue   Star Rating Drug: Losartan HCTZ 100/12.5 mg - last filled 01/22/2022 90 DS at [facility]  Chart Updates:  Recent office visits:  02/22/2022 Dorothyann Peng NP - Patient was seen for Acute pulmonary embolism without acute cor pulmonale, unspecified pulmonary embolism type and additional concerns. No medication changes.   01/25/2022 Dorothyann Peng NP - Patient was seen for Controlled type 2 diabetes mellitus with complication, without long-term current use of insulin and additional changes. No medication changes.   Recent consult visits:  02/13/2022 Araceli Bouche PA-C [facility]) - Patient was seen for shortness of breath and additional concerns. No medication changes.   Hospital visits:  Admitted to [facility] on 02/13/2022 due to acute pulmonary embolism and additional concerns. Discharge date was 02/15/2022.    New?Medications Started at [facility] Discharge:?? acetaminophen (TYLENOL) apixaban (ELIQUIS) Medication Changes at Hospital Discharge: No medication changes Medications Discontinued at Hospital Discharge: meloxicam 7.5 MG tablet (MOBIC) Medications that remain the same after Hospital Discharge:??  -All other medications will remain the same.    Medications: Outpatient Encounter Medications as of 04/13/2022  Medication Sig Note   acetaminophen (TYLENOL)  325 MG tablet Take 2 tablets (650 mg total) by mouth every 6 (six) hours as needed for mild pain (or Fever >/= 101).    allopurinol (ZYLOPRIM) 100 MG tablet TAKE 1 TABLET(100 MG) BY MOUTH DAILY (Patient taking differently: Take 100 mg by mouth in the morning.)    amLODipine  (NORVASC) 2.5 MG tablet TAKE 1 TABLET(2.5 MG) BY MOUTH DAILY (Patient taking differently: Take 2.5 mg by mouth in the morning.)    apixaban (ELIQUIS) 5 MG TABS tablet Take 2 tablets by mouth two times daily for 6 days. Then on 12/27, decrease to 1 tablet by mouth two times daily.    Cholecalciferol (VITAMIN D-3 PO) Take 1 capsule by mouth in the morning.    latanoprost (XALATAN) 0.005 % ophthalmic solution INSTILL 1 DROP IN BOTH EYES AT BEDTIME (Patient taking differently: Place 1 drop into both eyes at bedtime.)    losartan-hydrochlorothiazide (HYZAAR) 100-12.5 MG tablet TAKE 1 TABLET BY MOUTH DAILY (Patient taking differently: Take 1 tablet by mouth in the morning.)    MYRBETRIQ 50 MG TB24 tablet TAKE 1 TABLET(50 MG) BY MOUTH DAILY (Patient taking differently: Take 50 mg by mouth in the morning.) 02/14/2022: Pt states she is still taking this medication. Per dispense report, LF 12/02/2021 #30, 30 DS.   potassium chloride SA (KLOR-CON M) 20 MEQ tablet TAKE 1 TABLET(20 MEQ) BY MOUTH DAILY (Patient taking differently: Take 20 mEq by mouth in the morning.)    No facility-administered encounter medications on file as of 04/13/2022.  Fill History:  Dispensed Days Supply Quantity Provider Pharmacy  ALLOPURINOL 100MG TABLETS 02/20/2022 90 90 each      Dispensed Days Supply Quantity Provider Pharmacy  AMLODIPINE BESYLATE 2.5MG TABLETS 01/30/2022 90 90 each      Dispensed Days Supply Quantity Provider Pharmacy  ELIQUIS 5MG TABLETS 04/08/2022 30 60 each      Dispensed Days Supply Quantity Provider Pharmacy  LATANOPROST 0.005% OPHTH SOLN 2.5ML 03/09/2022 75 7.5 mL      Dispensed Days Supply Quantity Provider Pharmacy  LOSARTAN/HCTZ 100/12.5MG TABLETS 01/22/2022 90 90 each      Dispensed Days Supply Quantity Provider Pharmacy  MYRBETRIQ 50MG TABLETS 03/30/2022 30 30 each      Dispensed Days Supply Quantity Provider Pharmacy  POTASSIUM CL 20MEQ ER TABLETS 01/30/2022 90 90 each      Recent Office  Vitals: BP Readings from Last 3 Encounters:  02/22/22 130/80  02/15/22 (!) 157/71  01/25/22 130/80   Pulse Readings from Last 3 Encounters:  02/22/22 80  02/15/22 66  01/25/22 85    Wt Readings from Last 3 Encounters:  02/22/22 221 lb (100.2 kg)  02/13/22 218 lb 14.7 oz (99.3 kg)  01/25/22 219 lb (99.3 kg)     Kidney Function Lab Results  Component Value Date/Time   CREATININE 0.79 02/15/2022 03:23 AM   CREATININE 0.86 02/13/2022 05:21 PM   CREATININE 0.74 10/23/2019 10:52 AM   GFR 74.18 10/26/2021 08:35 AM   GFRNONAA >60 02/15/2022 03:23 AM   GFRNONAA 75 10/23/2019 10:52 AM   GFRAA 87 10/23/2019 10:52 AM       Latest Ref Rng & Units 02/15/2022    3:23 AM 02/13/2022    5:21 PM 10/26/2021    8:35 AM  BMP  Glucose 70 - 99 mg/dL 122  145  146   BUN 8 - 23 mg/dL 24  21  19   $ Creatinine 0.44 - 1.00 mg/dL 0.79  0.86  0.74   Sodium 135 -  145 mmol/L 139  139  141   Potassium 3.5 - 5.1 mmol/L 4.4  3.9  4.1   Chloride 98 - 111 mmol/L 104  102  103   CO2 22 - 32 mmol/L 28  24  28   $ Calcium 8.9 - 10.3 mg/dL 9.3  9.8  9.3    [place] Pharmacist Assistant [phone]

## 2022-04-26 ENCOUNTER — Telehealth: Payer: Self-pay

## 2022-04-26 ENCOUNTER — Telehealth: Payer: PPO

## 2022-04-26 ENCOUNTER — Ambulatory Visit (INDEPENDENT_AMBULATORY_CARE_PROVIDER_SITE_OTHER): Payer: PPO | Admitting: Adult Health

## 2022-04-26 ENCOUNTER — Encounter: Payer: Self-pay | Admitting: Adult Health

## 2022-04-26 VITALS — BP 130/68 | HR 94 | Temp 97.7°F | Ht 64.5 in | Wt 219.0 lb

## 2022-04-26 DIAGNOSIS — I10 Essential (primary) hypertension: Secondary | ICD-10-CM | POA: Diagnosis not present

## 2022-04-26 DIAGNOSIS — M25561 Pain in right knee: Secondary | ICD-10-CM

## 2022-04-26 DIAGNOSIS — G8929 Other chronic pain: Secondary | ICD-10-CM | POA: Diagnosis not present

## 2022-04-26 DIAGNOSIS — E118 Type 2 diabetes mellitus with unspecified complications: Secondary | ICD-10-CM

## 2022-04-26 LAB — POCT GLYCOSYLATED HEMOGLOBIN (HGB A1C): Hemoglobin A1C: 6.3 % — AB (ref 4.0–5.6)

## 2022-04-26 LAB — MICROALBUMIN / CREATININE URINE RATIO
Creatinine,U: 188.2 mg/dL
Microalb Creat Ratio: 0.9 mg/g (ref 0.0–30.0)
Microalb, Ur: 1.7 mg/dL (ref 0.0–1.9)

## 2022-04-26 MED ORDER — METHYLPREDNISOLONE ACETATE 80 MG/ML IJ SUSP
80.0000 mg | Freq: Once | INTRAMUSCULAR | Status: AC
  Administered 2022-04-26: 80 mg via INTRA_ARTICULAR

## 2022-04-26 NOTE — Progress Notes (Signed)
Subjective:    Patient ID: Kristina Allison, female    DOB: [birth year], 87 y.o.   MRN: [id number]  HPI 87 year old female who  has a past medical history of COLONIC POLYPS, HX OF (11/28/2006), DVT (03/10/2007), Edema (02/21/2007), ESOPHAGITIS NOS (09/27/2006), GOUT (06/04/2007), HIP PAIN, LEFT (01/30/2007), HYPERTENSION (11/28/2006), OBESITY NOS (09/27/2006), OSTEOARTHRITIS (11/28/2006), OSTEOARTHROSIS, GENERALIZED, UNSPC SITE (09/27/2006), PHLEBITIS&THROMBOPHLEBITIS LOWER EXTREM UNSPEC (03/07/2007), STATE, SYMPTOMATIC MENOPAUSE/FEM CLIMACTERIC (09/27/2006), and VENOUS INSUFFICIENCY, CHRONIC, RIGHT LEG (09/06/2009).  She presents to the office today for three month follow up regarding DM and HTN  DM Type 2 - has been diet controlled for quite some time. She has been working on reducing carbs and sugars.  Lab Results  Component Value Date   HGBA1C 6.3 (A) 04/26/2022   Hypertension-well-controlled with Norvasc 2.5 mg and losartan/HCTZ 100-12.5 mg.  She is supplemented with potassium.  She denies chest pain, shortness of breath, dizziness, lightheadedness, or syncopal episodes  BP Readings from Last 3 Encounters:  04/26/22 130/68  02/22/22 130/80  02/15/22 (!) 157/71   Chronic right knee pain - she has responded well to steroid injections in the past. Her last injection was roughly 4 months ago. Pain started to come back a few weeks ago. She is wondering if she can have another steroid injection today   Review of Systems See HPI   Past Medical History:  Diagnosis Date   COLONIC POLYPS, HX OF 11/28/2006   DVT 03/10/2007   Edema 02/21/2007   ESOPHAGITIS NOS 09/27/2006   GOUT 06/04/2007   HIP PAIN, LEFT 01/30/2007   HYPERTENSION 11/28/2006   OBESITY NOS 09/27/2006   OSTEOARTHRITIS 11/28/2006   OSTEOARTHROSIS, GENERALIZED, UNSPC SITE 09/27/2006   PHLEBITIS&THROMBOPHLEBITIS LOWER EXTREM UNSPEC 03/07/2007   STATE, SYMPTOMATIC MENOPAUSE/FEM CLIMACTERIC 09/27/2006   VENOUS INSUFFICIENCY, CHRONIC, RIGHT LEG 09/06/2009     Social History   Socioeconomic History   Marital status: Widowed    Spouse name: Not on file   Number of children: 7   Years of education: 12   Highest education level: High school graduate  Occupational History   Occupation: retired  Tobacco Use   Smoking status: Never   Smokeless tobacco: Never  Substance and Sexual Activity   Alcohol use: No   Drug use: No   Sexual activity: Not on file  Other Topics Concern   Not on file  Social History Narrative   Widowed   [place] 2 : son lives with her   Likes word searches, reading, television   Social Determinants of Health   Financial Resource Strain: Low Risk  (01/31/2022)   Overall Financial Resource Strain (CARDIA)    Difficulty of Paying Living Expenses: Not very hard  Food Insecurity: No Food Insecurity (02/16/2022)   Hunger Vital Sign    Worried About Running Out of Food in the Last Year: Never true    [place] in the Last Year: Never true  Transportation Needs: No Transportation Needs (02/16/2022)   PRAPARE - Hydrologist (Medical): No    Lack of Transportation (Non-Medical): No  Physical Activity: Inactive (04/27/2021)   Exercise Vital Sign    Days of Exercise per Week: 0 days    Minutes of Exercise per Session: 0 min  Stress: No Stress Concern Present (01/31/2022)   [place]    Feeling of Stress : Not at all  Social Connections: Moderately Integrated (01/31/2022)   Social Connection and  Isolation Panel [NHANES]    Frequency of Communication with Friends and Family: More than three times a week    Frequency of Social Gatherings with Friends and Family: More than three times a week    Attends Religious Services: More than 4 times per year    Active Member of [employer] or Organizations: Yes    Attends Archivist Meetings: More than 4 times per year    Marital Status: Widowed  Intimate Partner Violence: Not At  Risk (04/27/2021)   Humiliation, Afraid, Rape, and Kick questionnaire    Fear of Current or Ex-Partner: No    Emotionally Abused: No    Physically Abused: No    Sexually Abused: No    Past Surgical History:  Procedure Laterality Date   ABDOMINAL HYSTERECTOMY      No family history on file.  Allergies  Allergen Reactions   Cipro [Ciprofloxacin Hcl] Nausea Only and Other (See Comments)    Made pt feel like she was [place]   Macrobid [Nitrofurantoin Macrocrystal] Nausea Only and Other (See Comments)    Made pt feel like she was [place]"   Sulfacetamide Sodium Itching and Rash   Sulfamethoxazole-Trimethoprim Itching and Rash    Current Outpatient Medications on File Prior to Visit  Medication Sig Dispense Refill   acetaminophen (TYLENOL) 325 MG tablet Take 2 tablets (650 mg total) by mouth every 6 (six) hours as needed for mild pain (or Fever >/= 101).     allopurinol (ZYLOPRIM) 100 MG tablet TAKE 1 TABLET(100 MG) BY MOUTH DAILY (Patient taking differently: Take 100 mg by mouth in the morning.) 90 tablet 3   amLODipine (NORVASC) 2.5 MG tablet TAKE 1 TABLET(2.5 MG) BY MOUTH DAILY (Patient taking differently: Take 2.5 mg by mouth in the morning.) 90 tablet 3   apixaban (ELIQUIS) 5 MG TABS tablet Take 2 tablets by mouth two times daily for 6 days. Then on 12/27, decrease to 1 tablet by mouth two times daily. 72 tablet 0   Cholecalciferol (VITAMIN D-3 PO) Take 1 capsule by mouth in the morning.     latanoprost (XALATAN) 0.005 % ophthalmic solution INSTILL 1 DROP IN BOTH EYES AT BEDTIME (Patient taking differently: Place 1 drop into both eyes at bedtime.) 10 mL 1   losartan-hydrochlorothiazide (HYZAAR) 100-12.5 MG tablet TAKE 1 TABLET BY MOUTH DAILY (Patient taking differently: Take 1 tablet by mouth in the morning.) 90 tablet 3   MYRBETRIQ 50 MG TB24 tablet TAKE 1 TABLET(50 MG) BY MOUTH DAILY (Patient taking differently: Take 50 mg by mouth in the morning.) 30 tablet 11    potassium chloride SA (KLOR-CON M) 20 MEQ tablet TAKE 1 TABLET(20 MEQ) BY MOUTH DAILY (Patient taking differently: Take 20 mEq by mouth in the morning.) 90 tablet 3   No current facility-administered medications on file prior to visit.    BP 130/68   Pulse 94   Temp 97.7 F (36.5 C) (Oral)   Ht 5' 4.5" (1.638 m)   Wt 219 lb (99.3 kg)   SpO2 95%   BMI 37.01 kg/m       Objective:   Physical Exam Vitals and nursing note reviewed.  Constitutional:      Appearance: Normal appearance. She is obese.  Cardiovascular:     Rate and Rhythm: Normal rate and regular rhythm.     Pulses: Normal pulses.     Heart sounds: Normal heart sounds.  Pulmonary:     Effort: Pulmonary effort is normal.  Breath sounds: Normal breath sounds.  Skin:    General: Skin is warm and dry.  Neurological:     General: No focal deficit present.     Mental Status: She is alert and oriented to person, place, and time.  Psychiatric:        Mood and Affect: Mood normal.        Behavior: Behavior normal.        Thought Content: Thought content normal.        Judgment: Judgment normal.       Assessment & Plan:  1. Controlled type 2 diabetes mellitus with complication, without long-term current use of insulin (HCC)  - POC HgB A1c- 6.3  - Microalbumin/Creatinine Ratio, Urine; Future  2. Essential hypertension - Well controlled on current medication  - Microalbumin/Creatinine Ratio, Urine; Future  3. Chronic pain of right knee Discussed risks and benefits of corticosteroid injection and patient consented.  After prepping skin with betadine, injected 80 mg depomedrol and 2 cc of plain xylocaine with 22 gauge one and one half inch needle using anterolateral approach and pt tolerated well. - methylPREDNISolone acetate (DEPO-MEDROL) injection 80 mg  Dorothyann Peng, NP

## 2022-04-26 NOTE — Telephone Encounter (Signed)
   CCM RN Visit Note   04/26/22 Name: Kristina Allison MRN: [id number]      DOB: 1936/01/14  Subjective: Kristina Allison is a 87 y.o. year old female who is a primary care patient of Dorothyann Peng, NP. The patient was referred to the Chronic Care Management team for assistance with care management needs subsequent to provider initiation of CCM services and plan of care.      An unsuccessful outreach attempt was made today for a scheduled CCM visit.   PLAN: Phone rang multiple times without option to leave a voice message. Will make additional attempts and schedule when patient is available.   Horris Latino RN Care Manager/Chronic Care Management [phone]

## 2022-04-26 NOTE — Patient Instructions (Addendum)
Your A1c was 6.3 - this is good and has not changed   We will have you follow up after 10/27/2022 for your annual exam.

## 2022-04-27 ENCOUNTER — Other Ambulatory Visit: Payer: Self-pay | Admitting: Adult Health

## 2022-04-27 DIAGNOSIS — I10 Essential (primary) hypertension: Secondary | ICD-10-CM

## 2022-04-30 ENCOUNTER — Ambulatory Visit (INDEPENDENT_AMBULATORY_CARE_PROVIDER_SITE_OTHER): Payer: PPO

## 2022-04-30 VITALS — Ht 64.5 in | Wt 219.0 lb

## 2022-04-30 DIAGNOSIS — Z0000 Encounter for general adult medical examination without abnormal findings: Secondary | ICD-10-CM

## 2022-04-30 NOTE — Progress Notes (Signed)
Subjective:   Kristina Allison is a 87 y.o. female who presents for Medicare Annual (Subsequent) preventive examination.  Review of Systems    Virtual Visit via Telephone Note  I connected with  Kristina Allison on 04/30/22 at  9:15 AM EST by telephone and verified that I am speaking with the correct person using two identifiers.  Location: Patient: Home Provider: Office Persons participating in the virtual visit: patient/Nurse Health Advisor   I discussed the limitations, risks, security and privacy concerns of performing an evaluation and management service by telephone and the availability of in person appointments. The patient expressed understanding and agreed to proceed.  Interactive audio and video telecommunications were attempted between this nurse and patient, however failed, due to patient having technical difficulties OR patient did not have access to video capability.  We continued and completed visit with audio only.  Some vital signs may be absent or patient reported.   [name], LPN  Cardiac Risk Factors include: advanced age (>47mn, >>42women);hypertension     Objective:    Today's Vitals   04/30/22 0922  Weight: 219 lb (99.3 kg)  Height: 5' 4.5" (1.638 m)   Body mass index is 37.01 kg/m.     04/30/2022    9:29 AM 02/14/2022   10:54 AM 02/13/2022    5:19 PM 04/27/2021    9:12 AM 04/26/2020    9:05 AM 04/06/2019    9:13 AM  Advanced Directives  Does Patient Have a Medical Advance Directive? Yes Yes No Yes Yes Yes  Type of AParamedicof ANorrisLiving will Living will;Healthcare Power of [place] will [place] will [place]pewa Park[place] will  Does patient want to make changes to medical advance directive? No - Patient declined No - Guardian declined  No - Patient declined No - Patient declined No - Patient declined  Copy of [place] Chart? Yes - validated most recent copy scanned in chart (See row information) No - copy requested  No - copy requested No - copy requested No - copy requested  Would patient like information on creating a medical advance directive?   No - Patient declined       Current Medications (verified) Outpatient Encounter Medications as of 04/30/2022  Medication Sig   acetaminophen (TYLENOL) 325 MG tablet Take 2 tablets (650 mg total) by mouth every 6 (six) hours as needed for mild pain (or Fever >/= 101).   allopurinol (ZYLOPRIM) 100 MG tablet TAKE 1 TABLET(100 MG) BY MOUTH DAILY (Patient taking differently: Take 100 mg by mouth in the morning.)   amLODipine (NORVASC) 2.5 MG tablet TAKE 1 TABLET(2.5 MG) BY MOUTH DAILY (Patient taking differently: Take 2.5 mg by mouth in the morning.)   apixaban (ELIQUIS) 5 MG TABS tablet Take 2 tablets by mouth two times daily for 6 days. Then on 12/27, decrease to 1 tablet by mouth two times daily.   Cholecalciferol (VITAMIN D-3 PO) Take 1 capsule by mouth in the morning.   latanoprost (XALATAN) 0.005 % ophthalmic solution INSTILL 1 DROP IN BOTH EYES AT BEDTIME (Patient taking differently: Place 1 drop into both eyes at bedtime.)   losartan-hydrochlorothiazide (HYZAAR) 100-12.5 MG tablet TAKE 1 TABLET BY MOUTH DAILY   MYRBETRIQ 50 MG TB24 tablet TAKE 1 TABLET(50 MG) BY MOUTH DAILY (Patient taking differently: Take 50 mg by mouth in the morning.)   potassium chloride SA (KLOR-CON M) 20  MEQ tablet TAKE 1 TABLET(20 MEQ) BY MOUTH DAILY (Patient taking differently: Take 20 mEq by mouth in the morning.)   No facility-administered encounter medications on file as of 04/30/2022.    Allergies (verified) Cipro [ciprofloxacin hcl], Macrobid [nitrofurantoin macrocrystal], Sulfacetamide sodium, and Sulfamethoxazole-trimethoprim   History: Past Medical History:  Diagnosis Date   COLONIC POLYPS, HX OF 11/28/2006   DVT 03/10/2007   Edema 02/21/2007   ESOPHAGITIS NOS  09/27/2006   GOUT 06/04/2007   HIP PAIN, LEFT 01/30/2007   HYPERTENSION 11/28/2006   OBESITY NOS 09/27/2006   OSTEOARTHRITIS 11/28/2006   OSTEOARTHROSIS, GENERALIZED, UNSPC SITE 09/27/2006   PHLEBITIS&THROMBOPHLEBITIS LOWER EXTREM UNSPEC 03/07/2007   STATE, SYMPTOMATIC MENOPAUSE/FEM CLIMACTERIC 09/27/2006   VENOUS INSUFFICIENCY, CHRONIC, RIGHT LEG 09/06/2009   Past Surgical History:  Procedure Laterality Date   ABDOMINAL HYSTERECTOMY     History reviewed. No pertinent family history. Social History   Socioeconomic History   Marital status: Widowed    Spouse name: Not on file   Number of children: 7   Years of education: 12   Highest education level: High school graduate  Occupational History   Occupation: retired  Tobacco Use   Smoking status: Never   Smokeless tobacco: Never  Substance and Sexual Activity   Alcohol use: No   Drug use: No   Sexual activity: Not on file  Other Topics Concern   Not on file  Social History Narrative   Widowed   [place] 2 : son lives with her   Likes word searches, reading, television   Social Determinants of Health   Financial Resource Strain: Low Risk  (04/30/2022)   Overall Financial Resource Strain (CARDIA)    Difficulty of Paying Living Expenses: Not hard at all  Food Insecurity: No Food Insecurity (04/30/2022)   Hunger Vital Sign    Worried About Running Out of Food in the Last Year: Never true    [place] in the Last Year: Never true  Transportation Needs: No Transportation Needs (04/30/2022)   PRAPARE - Hydrologist (Medical): No    Lack of Transportation (Non-Medical): No  Physical Activity: Inactive (04/30/2022)   Exercise Vital Sign    Days of Exercise per Week: 0 days    Minutes of Exercise per Session: 0 min  Stress: No Stress Concern Present (04/30/2022)   [place]    Feeling of Stress : Not at all  Social Connections: Moderately Integrated  (04/30/2022)   Social Connection and Isolation Panel [NHANES]    Frequency of Communication with Friends and Family: More than three times a week    Frequency of Social Gatherings with Friends and Family: More than three times a week    Attends Religious Services: More than 4 times per year    Active Member of [employer] or Organizations: Yes    Attends Archivist Meetings: More than 4 times per year    Marital Status: Widowed    Tobacco Counseling Counseling given: Not Answered   Clinical Intake:  Pre-visit preparation completed: No  Pain : No/denies pain     BMI - recorded: 37.01 Nutritional Status: BMI > 30  Obese Nutritional Risks: None Diabetes: No  How often do you need to have someone help you when you read instructions, pamphlets, or other written materials from your doctor or pharmacy?: 1 - Never  Diabetic?  No  Interpreter Needed?: No  Information entered by :: Rolene Arbour  LPN   Activities of Daily Living    04/30/2022    9:28 AM 02/14/2022   10:54 AM  In your present state of health, do you have any difficulty performing the following activities:  Hearing? 0 0  Vision? 0 0  Difficulty concentrating or making decisions? 0 0  Walking or climbing stairs? 0 0  Dressing or bathing? 0 0  Doing errands, shopping? 0 0  Preparing Food and eating ? N   Using the Toilet? N   In the past six months, have you accidently leaked urine? Y   Comment Followed by PCP   Do you have problems with loss of bowel control? N   Managing your Medications? N   Managing your Finances? N   Housekeeping or managing your Housekeeping? N     Patient Care Team: Dorothyann Peng, NP as PCP - General (Family Medicine) Viona Gilmore, [facility] (Inactive) as Pharmacist (Pharmacist) Neldon Labella, RN as Case Manager  Indicate any recent Medical Services you may have received from other than [facility] providers in the past year (date may be approximate).     Assessment:   This is a  routine wellness examination for Kristina Allison.  Hearing/Vision screen Hearing Screening - Comments:: Denies hearing difficulties   Vision Screening - Comments:: Wears rx glasses - up to date with routine eye exams with [place] issues and exercise activities discussed: Exercise limited by: None identified   Goals Addressed               This Visit's Progress     Stay Alive (pt-stated)         Depression Screen    04/30/2022    9:26 AM 01/31/2022    2:20 PM 09/11/2021    3:34 PM 04/27/2021    9:08 AM 01/26/2021    9:02 AM 04/26/2020    9:06 AM 04/06/2019    9:14 AM  PHQ 2/9 Scores  PHQ - 2 Score 0 0 0 0 0 0 0  PHQ- 9 Score   0  3      Fall Risk    04/30/2022    9:29 AM 01/31/2022    2:20 PM 09/11/2021    3:34 PM 04/27/2021    9:11 AM 01/26/2021    9:01 AM  Fall Risk   Falls in the past year? 0 0 0 0 1  Number falls in past yr: 0 0 0 0 0  Injury with Fall? 0 0 0 0 0  Risk for fall due to : No Fall Risks Medication side effect;Other (Comment) No Fall Risks No Fall Risks   Risk for fall due to: Comment  Reports having a ramp to enter home.     Follow up Falls prevention discussed Falls prevention discussed Falls prevention discussed      FALL RISK PREVENTION PERTAINING TO THE HOME:  Any stairs in or around the home? No  If so, are there any without handrails? No  Home free of loose throw rugs in walkways, pet beds, electrical cords, etc? Yes  Adequate lighting in your home to reduce risk of falls? Yes   ASSISTIVE DEVICES UTILIZED TO PREVENT FALLS:  Life alert? No  Use of a cane, walker or w/c? No  Grab bars in the bathroom? Yes  Shower chair or bench in shower? Yes  Elevated toilet seat or a handicapped toilet? Yes   TIMED UP AND GO:  Was the test performed? No Audio Visit  Cognitive Function:        04/30/2022    9:29 AM 04/27/2021    9:13 AM 04/06/2019    9:15 AM  6CIT Screen  What Year? 0 points 0 points 0 points  What month? 0 points 0 points 0 points   What time? 0 points 0 points 0 points  Count back from 20 0 points 0 points 0 points  Months in reverse 0 points 0 points 0 points  Repeat phrase 0 points 0 points 0 points  Total Score 0 points 0 points 0 points    Immunizations Immunization History  Administered Date(s) Administered   Fluad Quad(high Dose 65+) 11/22/2018, 09/16/2020, 11/07/2021   Influenza Split 11/29/2011   Influenza Whole 11/18/2007, 12/05/2009, 11/07/2010   Influenza, High Dose Seasonal PF 12/10/2012, 12/23/2014, 12/14/2015, 12/04/2016, 11/29/2017   Influenza,inj,Quad PF,6+ Mos 12/07/2013   Influenza-Unspecified 11/25/2019   PFIZER(Purple Top)SARS-COV-2 Vaccination 04/03/2019, 04/28/2019, 12/02/2019, 09/16/2020   Pfizer Covid-19 Vaccine Bivalent Booster 74yr & up 11/18/2021   Pneumococcal Conjugate-13 02/02/2013   Pneumococcal Polysaccharide-23 07/29/2008   Td 07/29/2008   Zoster Recombinat (Shingrix) 11/18/2021    TDAP status: Due, Education has been provided regarding the importance of this vaccine. Advised may receive this vaccine at local pharmacy or Health Dept. Aware to provide a copy of the vaccination record if obtained from local pharmacy or Health Dept. Verbalized acceptance and understanding.  Flu Vaccine status: Up to date  Pneumococcal vaccine status: Up to date  Covid-19 vaccine status: Completed vaccines  Qualifies for Shingles Vaccine? Yes   Zostavax completed Yes   Shingrix Completed?: Yes  Screening Tests Health Maintenance  Topic Date Due   DTaP/Tdap/Td (2 - Tdap) 07/30/2018   COVID-19 Vaccine (6 - 2023-24 season) 05/16/2022 (Originally 01/13/2022)   MAMMOGRAM  11/24/2022   Diabetic kidney evaluation - eGFR measurement  02/16/2023   Diabetic kidney evaluation - Urine ACR  04/26/2023   Medicare Annual Wellness (AWV)  04/30/2023   Pneumonia Vaccine 87 Years old  Completed   INFLUENZA VACCINE  Completed   DEXA SCAN  Completed   HPV VACCINES  Aged Out   Zoster Vaccines-  Shingrix  Discontinued    Health Maintenance  Health Maintenance Due  Topic Date Due   DTaP/Tdap/Td (2 - Tdap) 07/30/2018    Colorectal cancer screening: No longer required.   Mammogram status: No longer required due to Age.  Bone Density status: Completed 06/07/20. Results reflect: Bone density results: OSTEOPOROSIS. Repeat every   years.  Lung Cancer Screening: (Low Dose CT Chest recommended if Age 85-80years, 30 pack-year currently smoking OR have quit w/in 15years.) does not qualify.    Additional Screening:  Hepatitis C Screening: does not qualify; Completed   Vision Screening: Recommended annual ophthalmology exams for early detection of glaucoma and other disorders of the eye. Is the patient up to date with their annual eye exam?  Yes  Who is the provider or what is the name of the office in which the patient attends annual [facility] exams[facility] is not [facility] with a provider, would they like to be referred to a provider to establish care? No .   Dental Screening: Recommended annual dental exams for proper oral hygiene  Community Resource Referral / Chronic Care Management:  CRR required this visit?  No   CCM required this visit?  No      Plan:     I have personally reviewed and noted the following in the patient's chart:  Medical and social history Use of alcohol, tobacco or illicit drugs  Current medications and supplements including opioid prescriptions. Patient is not currently taking opioid prescriptions. Functional ability and status Nutritional status Physical activity Advanced directives List of other physicians Hospitalizations, surgeries, and ER visits in previous 12 months Vitals Screenings to include cognitive, depression, and falls Referrals and appointments  In addition, I have reviewed and discussed with patient certain preventive protocols, quality metrics, and best practice recommendations. A written personalized care  plan for preventive services as well as general preventive health recommendations were provided to patient.     [name], LPN   [id number]   Nurse Notes: None

## 2022-04-30 NOTE — Patient Instructions (Addendum)
Ms. Renfrew , Thank you for taking time to come for your Medicare Wellness Visit. I appreciate your ongoing commitment to your health goals. Please review the following plan we discussed and let me know if I can assist you in the future.   These are the goals we discussed:  Goals       Exercise 3x per week (30 min per time) (pt-stated)      Try to start walking outside on days when weather is cooperative.      Goal: CCM (Diabetes) Expected Outcome: Monitor, Self-Manage and Reduce Symptoms of Diabetes      Current Barriers:  Chronic Disease Management support and education needs related to Diabetes  Planned Interventions: Reviewed provider's plan for diabetes management. Reports doing very well. Currently controlled with without medication. A1C is at goal 6.3%. Discussed nutritional intake and importance of complying with a diabetic diet. Reports attempting to adhere to recommended diet. Monitoring intake of carbohydrates and added sugars. Declined need for additional nutritional resources. Discussed importance of completing recommended DM preventive care. Reports completing recommended foot care. Reports eye exam is up to date.  Discussed importance of completing ordered labs as prescribed.  Assessed social determinant of health barriers. Update on 03/21/22: No changes  Lab Results  Component Value Date   HGBA1C 6.3 (A) 01/25/2022      Symptom Management: Attend all scheduled provider appointments Continue adhering to diabetic diet Read food labels for fat, fiber, carbohydrates and portion size Wash and dry feet carefully every day Check feet daily for cuts, sores or redness Wear comfortable, cotton socks Wear comfortable, well-fitting shoes Call provider office for new concerns or questions Seek immediate medical attention for worsening symptoms  Follow Up Plan:  Will follow up next month        Goal: CCM (Hypertension) Expected Outcome:  Monitor, Self-Manage And Reduce  Symptoms of Hypertension      Current Barriers:  Chronic Disease Management support and education needs related to Hypertension   Planned Interventions: Reviewed plan for hypertension management. Continues taking medications s prescribed. Will update the care management team if she requires assistance d/t prescription cost. Discussed updates since hospital admission on 02/13/22 d/t Acute PE. Reports doing well since hospital discharge. Discussed importance of discontinuing meloxicam as instructed. Started Eliquis 5 mg.  Thorough discussion regarding bleeding risks and need to avoid NSAIDs. Discussed importance of seeking immediate medical attention if she experiences a head injury. Reviewed blood pressure readings. Reports monitoring at home as advised. Reports readings have been within range. Recalls one elevated reading on the day of a scheduled clinic visit but notes reading was within range when BP was repeated. Reviewed established parameters and indications for notifying a provider. Reviewed symptoms. Denies chest pain, palpitations, or shortness of breath. Denies recent headaches or visual changes. Reviewed s/sx of heart attack, stroke and worsening symptoms that require immediate medical attention.  BP Readings from Last 3 Encounters:  02/22/22 130/80  02/15/22 (!) 157/71  01/25/22 130/80    Symptom Management: Take medications exactly as prescribed   Attend all scheduled provider appointments Call pharmacy for medication refills 3-7 days in advance of running out of medications Monitor BP and maintain a log Continue adhering to cardiac prudent diet and limiting sodium intake Call provider office for new concerns or questions Seek immediate medical attention for worsening symptoms or if you experience a head injury.  Follow Up Plan:  Will follow up next month        Patient  Stated      I want to maintain my health.      Stay Alive (pt-stated)      Track and Manage My Blood  Pressure-Hypertension      Timeframe:  Short-Term Goal Priority:  Medium Start Date:                             Expected End Date:                       Follow Up Date 9/31/22    - check blood pressure weekly - choose a place to take my blood pressure (home, clinic or office, retail store) - write blood pressure results in a log or diary    Why is this important?   You won't feel high blood pressure, but it can still hurt your blood vessels.  High blood pressure can cause heart or kidney problems. It can also cause a stroke.  Making lifestyle changes like losing a little weight or eating less salt will help.  Checking your blood pressure at home and at different times of the day can help to control blood pressure.  If the doctor prescribes medicine remember to take it the way the doctor ordered.  Call the office if you cannot afford the medicine or if there are questions about it.     Notes:         This is a list of the screening recommended for you and due dates:  Health Maintenance  Topic Date Due   DTaP/Tdap/Td vaccine (2 - Tdap) 07/30/2018   COVID-19 Vaccine (6 - 2023-24 season) 05/16/2022*   Mammogram  11/24/2022   Yearly kidney function blood test for diabetes  02/16/2023   Yearly kidney health urinalysis for diabetes  04/26/2023   Medicare Annual Wellness Visit  04/30/2023   Pneumonia Vaccine  Completed   Flu Shot  Completed   DEXA scan (bone density measurement)  Completed   HPV Vaccine  Aged Out   Zoster (Shingles) Vaccine  Discontinued  *Topic was postponed. The date shown is not the original due date.    Advanced directives: In Chart  Conditions/risks identified: None  Next appointment: Follow up in one year for your annual wellness visit    Preventive Care 65 Years and Older, Female Preventive care refers to lifestyle choices and visits with your health care provider that can promote health and wellness. What does preventive care include? A yearly  physical exam. This is also called an annual well check. Dental exams once or twice a year. Routine eye exams. Ask your health care provider how often you should have your eyes checked. Personal lifestyle choices, including: Daily care of your teeth and gums. Regular physical activity. Eating a healthy diet. Avoiding tobacco and drug use. Limiting alcohol use. Practicing safe sex. Taking low-dose aspirin every day. Taking vitamin and mineral supplements as recommended by your health care provider. What happens during an annual well check? The services and screenings done by your health care provider during your annual well check will depend on your age, overall health, lifestyle risk factors, and family history of disease. Counseling  Your health care provider may ask you questions about your: Alcohol use. Tobacco use. Drug use. Emotional well-being. Home and relationship well-being. Sexual activity. Eating habits. History of falls. Memory and ability to understand (cognition). Work and work Statistician. Reproductive health. Screening  You may have the  following tests or measurements: Height, weight, and BMI. Blood pressure. Lipid and cholesterol levels. These may be checked every 5 years, or more frequently if you are over 93 years old. Skin check. Lung cancer screening. You may have this screening every year starting at age 21 if you have a 30-pack-year history of smoking and currently smoke or have quit within the past 15 years. Fecal occult blood test (FOBT) of the stool. You may have this test every year starting at age 74. Flexible sigmoidoscopy or colonoscopy. You may have a sigmoidoscopy every 5 years or a colonoscopy every 10 years starting at age 74. Hepatitis C blood test. Hepatitis B blood test. Sexually transmitted disease (STD) testing. Diabetes screening. This is done by checking your blood sugar (glucose) after you have not eaten for a while (fasting). You may  have this done every 1-3 years. Bone density scan. This is done to screen for osteoporosis. You may have this done starting at age 52. Mammogram. This may be done every 1-2 years. Talk to your health care provider about how often you should have regular mammograms. Talk with your health care provider about your test results, treatment options, and if necessary, the need for more tests. Vaccines  Your health care provider may recommend certain vaccines, such as: Influenza vaccine. This is recommended every year. Tetanus, diphtheria, and acellular pertussis (Tdap, Td) vaccine. You may need a Td booster every 10 years. Zoster vaccine. You may need this after age 70. Pneumococcal 13-valent conjugate (PCV13) vaccine. One dose is recommended after age 54. Pneumococcal polysaccharide (PPSV23) vaccine. One dose is recommended after age 65. Talk to your health care provider about which screenings and vaccines you need and how often you need them. This information is not intended to replace advice given to you by your health care provider. Make sure you discuss any questions you have with your health care provider. Document Released: 03/11/2015 Document Revised: 11/02/2015 Document Reviewed: 12/14/2014 Elsevier Interactive Patient Education  2017 [place] Prevention in the Home Falls can cause injuries. They can happen to people of all ages. There are many things you can do to make your home safe and to help prevent falls. What can I do on the outside of my home? Regularly fix the edges of walkways and driveways and fix any cracks. Remove anything that might make you trip as you walk through a door, such as a raised step or threshold. Trim any bushes or trees on the path to your home. Use bright outdoor lighting. Clear any walking paths of anything that might make someone trip, such as rocks or tools. Regularly check to see if handrails are loose or broken. Make sure that both sides of any  steps have handrails. Any raised decks and porches should have guardrails on the edges. Have any leaves, snow, or ice cleared regularly. Use sand or salt on walking paths during winter. Clean up any spills in your garage right away. This includes oil or grease spills. What can I do in the bathroom? Use night lights. Install grab bars by the toilet and in the tub and shower. Do not use towel bars as grab bars. Use non-skid mats or decals in the tub or shower. If you need to sit down in the shower, use a plastic, non-slip stool. Keep the floor dry. Clean up any water that spills on the floor as soon as it happens. Remove soap buildup in the tub or shower regularly. Attach bath mats securely  with double-sided non-slip rug tape. Do not have throw rugs and other things on the floor that can make you trip. What can I do in the bedroom? Use night lights. Make sure that you have a light by your bed that is easy to reach. Do not use any sheets or blankets that are too big for your bed. They should not hang down onto the floor. Have a firm chair that has side arms. You can use this for support while you get dressed. Do not have throw rugs and other things on the floor that can make you trip. What can I do in the kitchen? Clean up any spills right away. Avoid walking on wet floors. Keep items that you use a lot in easy-to-reach places. If you need to reach something above you, use a strong step stool that has a grab bar. Keep electrical cords out of the way. Do not use floor polish or wax that makes floors slippery. If you must use wax, use non-skid floor wax. Do not have throw rugs and other things on the floor that can make you trip. What can I do with my stairs? Do not leave any items on the stairs. Make sure that there are handrails on both sides of the stairs and use them. Fix handrails that are broken or loose. Make sure that handrails are as long as the stairways. Check any carpeting to  make sure that it is firmly attached to the stairs. Fix any carpet that is loose or worn. Avoid having throw rugs at the top or bottom of the stairs. If you do have throw rugs, attach them to the floor with carpet tape. Make sure that you have a light switch at the top of the stairs and the bottom of the stairs. If you do not have them, ask someone to add them for you. What else can I do to help prevent falls? Wear shoes that: Do not have high heels. Have rubber bottoms. Are comfortable and fit you well. Are closed at the toe. Do not wear sandals. If you use a stepladder: Make sure that it is fully opened. Do not climb a closed stepladder. Make sure that both sides of the stepladder are locked into place. Ask someone to hold it for you, if possible. Clearly mark and make sure that you can see: Any grab bars or handrails. First and last steps. Where the edge of each step is. Use tools that help you move around (mobility aids) if they are needed. These include: Canes. Walkers. Scooters. Crutches. Turn on the lights when you go into a dark area. Replace any light bulbs as soon as they burn out. Set up your furniture so you have a clear path. Avoid moving your furniture around. If any of your floors are uneven, fix them. If there are any pets around you, be aware of where they are. Review your medicines with your doctor. Some medicines can make you feel dizzy. This can increase your chance of falling. Ask your doctor what other things that you can do to help prevent falls. This information is not intended to replace advice given to you by your health care provider. Make sure you discuss any questions you have with your health care provider. Document Released: 12/09/2008 Document Revised: 07/21/2015 Document Reviewed: 03/19/2014 Elsevier Interactive Patient Education  2017 [employer].

## 2022-05-17 ENCOUNTER — Telehealth: Payer: Self-pay

## 2022-05-17 NOTE — Telephone Encounter (Signed)
   CCM RN Visit Note   05/17/22 Name: [name] MRN: [id number]      DOB: [birth year]  Subjective: Kristina Allison is a 87 y.o. year old female who is a primary care patient of Dorothyann Peng, NP. The patient was referred to the Chronic Care Management team for assistance with care management needs subsequent to provider initiation of CCM services and plan of care.      An unsuccessful outreach was attempted today.  PLAN: A HIPAA compliant phone message was left for the patient providing contact information and requesting a return call.   Horris Latino RN Care Manager/Chronic Care Management [phone]

## 2022-06-07 ENCOUNTER — Other Ambulatory Visit: Payer: Self-pay | Admitting: Adult Health

## 2022-06-07 ENCOUNTER — Telehealth: Payer: Self-pay | Admitting: Adult Health

## 2022-06-07 NOTE — Telephone Encounter (Signed)
Prescription Request  06/07/2022  LOV: 04/26/2022  What is the name of the medication or equipment? apixaban (ELIQUIS) 5 MG TABS tablet  Have you contacted your pharmacy to request a refill? Yes   Which pharmacy would you like this sent to?   [id number] - [place],  - [address] AT [facility] [address] [facility] [address] Phone: [phone]  Fax: [phone]      Patient notified that their request is being sent to the clinical staff for review and that they should receive a response within 2 business days.   Please advise at Mobile:   [phone]

## 2022-06-07 NOTE — Telephone Encounter (Signed)
Doesn't look like this is filled by [facility]. Will route for advise

## 2022-06-08 NOTE — Telephone Encounter (Signed)
Noted  

## 2022-06-25 ENCOUNTER — Telehealth: Payer: Self-pay

## 2022-06-25 NOTE — Progress Notes (Unsigned)
Care Management & Coordination Services Pharmacy Note  06/27/2022 Name:  Kristina Allison MRN:  [id number] DOB:  08-26-1935  Summary: BP at goal <130/80 Denies any signs of bleed or clot with Eliquis use  Recommendations/Changes made from today's visit: -Counseled to check BP once weekly and keep a log -Counseled on signs of major bleed and to notify office of any falls  Follow up plan: General in 3 months Pharmacist visit in 6-7 months   Subjective: Kristina Allison is an 87 y.o. year old female who is a primary patient of Shirline Frees, NP.  The care coordination team was consulted for assistance with disease management and care coordination needs.    Engaged with patient by telephone for follow up visit.  Recent office visits: 02/22/2022 Shirline Frees NP - Patient was seen for Acute pulmonary embolism without acute cor pulmonale, unspecified pulmonary embolism type and additional concerns. No medication changes.    01/25/2022 Shirline Frees NP - Patient was seen for Controlled type 2 diabetes mellitus with complication, without long-term current use of insulin and additional changes. No medication changes  Recent consult visits: 02/13/2022 Arnette Felts PA-C [facility]) - Patient was seen for shortness of breath and additional concerns. No medication changes   Hospital visits: [facility]/22 [facility], LOS 2 days, for acute PE. Start tylenol and Eliquis, STOP Meloxicam   Objective:  Lab Results  Component Value Date   CREATININE 0.79 02/15/2022   BUN 24 (H) 02/15/2022   GFR 74.18 10/26/2021   GFRNONAA >60 02/15/2022   GFRAA 87 10/23/2019   NA 139 02/15/2022   K 4.4 02/15/2022   CALCIUM 9.3 02/15/2022   CO2 28 02/15/2022   GLUCOSE 122 (H) 02/15/2022    Lab Results  Component Value Date/Time   HGBA1C 6.3 (A) 04/26/2022 09:02 AM   HGBA1C 6.3 (A) 01/25/2022 10:23 AM   HGBA1C 7.0 (H) 10/26/2021 08:35 AM   HGBA1C 6.5 01/26/2021 08:58 AM   HGBA1C 6.8 (H)  10/25/2020 09:25 AM   GFR 74.18 10/26/2021 08:35 AM   GFR 78.51 10/25/2020 09:25 AM   MICROALBUR 1.7 04/26/2022 10:21 AM    Last diabetic Eye exam: No results found for: "HMDIABEYEEXA"  Last diabetic Foot exam: No results found for: "HMDIABFOOTEX"   Lab Results  Component Value Date   CHOL 147 10/26/2021   HDL 47.90 10/26/2021   LDLCALC 79 10/26/2021   TRIG 98.0 10/26/2021   CHOLHDL 3 10/26/2021       Latest Ref Rng & Units 10/26/2021    8:35 AM 10/25/2020    9:25 AM 10/23/2019   10:52 AM  Hepatic Function  Total Protein 6.0 - 8.3 g/dL 7.0  7.3  7.1   Albumin 3.5 - 5.2 g/dL 3.7  3.8    AST 0 - 37 U/L 17  33  18   ALT 0 - 35 U/L 15  32  16   Alk Phosphatase 39 - 117 U/L 69  73    Total Bilirubin 0.2 - 1.2 mg/dL 1.1  1.2  1.3     Lab Results  Component Value Date/Time   TSH 2.00 10/26/2021 08:35 AM   TSH 1.51 10/25/2020 09:25 AM       Latest Ref Rng & Units 02/15/2022    3:23 AM 02/14/2022   11:50 AM 02/13/2022    5:21 PM  CBC  WBC 4.0 - 10.5 K/uL 11.0  10.6  14.3   Hemoglobin 12.0 - 15.0 g/dL 40.9  81.1  91.4  Hematocrit 36.0 - 46.0 % 41.0  40.3  45.7   Platelets 150 - 400 K/uL 248  274  309     No results found for: "VD25OH", "VITAMINB12"  Clinical ASCVD: No  The ASCVD Risk score (Arnett DK, et al., 2019) failed to calculate for the following reasons:   The 2019 ASCVD risk score is only valid for ages 97 to 47       04/30/2022    9:26 AM 01/31/2022    2:20 PM 09/11/2021    3:34 PM  Depression screen PHQ 2/9  Decreased Interest 0 0 0  Down, Depressed, Hopeless 0 0 0  PHQ - 2 Score 0 0 0  Altered sleeping   0  Tired, decreased energy   0  Change in appetite   0  Feeling bad or failure about yourself    0  Trouble concentrating   0  Moving slowly or fidgety/restless   0  Suicidal thoughts   0  PHQ-9 Score   0  Difficult doing work/chores   Not difficult at all     Social History   Tobacco Use  Smoking Status Never  Smokeless Tobacco Never   BP  Readings from Last 3 Encounters:  04/26/22 130/68  02/22/22 130/80  02/15/22 (!) 157/71   Pulse Readings from Last 3 Encounters:  04/26/22 94  02/22/22 80  02/15/22 66   Wt Readings from Last 3 Encounters:  04/30/22 219 lb (99.3 kg)  04/26/22 219 lb (99.3 kg)  02/22/22 221 lb (100.2 kg)   BMI Readings from Last 3 Encounters:  04/30/22 37.01 kg/m  04/26/22 37.01 kg/m  02/22/22 37.35 kg/m    Allergies  Allergen Reactions   Cipro [Ciprofloxacin Hcl] Nausea Only and Other (See Comments)    Made pt feel like [place] was [place]   Macrobid [Nitrofurantoin Macrocrystal] Nausea Only and Other (See Comments)    Made pt feel like she was [place]"   Sulfacetamide Sodium Itching and Rash   Sulfamethoxazole-Trimethoprim Itching and Rash    Medications Reviewed Today     Reviewed by Sherrill Raring, RPH (Pharmacist) on 06/27/22 at 0752  Med List Status: <None>   Medication Order Taking? Sig Documenting Provider Last Dose Status Informant  acetaminophen (TYLENOL) 325 MG tablet [id number] No Take 2 tablets (650 mg total) by mouth every 6 (six) hours as needed for mild pain (or Fever >/= 101). Lonia Blood, MD Taking Active   allopurinol (ZYLOPRIM) 100 MG tablet [id number] No TAKE 1 TABLET(100 MG) BY MOUTH DAILY  Patient taking differently: Take 100 mg by mouth in the morning.   Shirline Frees, NP Taking Active Self, Pharmacy Records  amLODipine (NORVASC) 2.5 MG tablet [id number] No TAKE 1 TABLET(2.5 MG) BY MOUTH DAILY  Patient taking differently: Take 2.5 mg by mouth in the morning.   Shirline Frees, NP Taking Active Self, Pharmacy Records  apixaban (ELIQUIS) 5 MG TABS tablet [id number]  TAKE 1 TABLET BY MOUTH TWICE DAILY Nafziger, Kandee Keen, NP  Active   Cholecalciferol (VITAMIN D-3 PO) [id number] No Take 1 capsule by mouth in the morning. [provider] Taking Active Self  latanoprost (XALATAN) 0.005 % ophthalmic solution [id number] No INSTILL 1 DROP IN BOTH EYES  AT BEDTIME  Patient taking differently: Place 1 drop into both eyes at bedtime.   Roderick Pee, MD Taking Active Self, Pharmacy Records  losartan-hydrochlorothiazide [facility]) 100-12.5 MG tablet [id number]  TAKE 1 TABLET BY MOUTH DAILY Nafziger, Kandee Keen, NP  Active  MYRBETRIQ 50 MG TB24 tablet [id number] No TAKE 1 TABLET(50 MG) BY MOUTH DAILY  Patient taking differently: Take 50 mg by mouth in the morning.   Shirline Frees, NP Taking Active Self, Pharmacy Records           Med Note (COFFELL, Marzella Schlein   Wed Feb 14, 2022  3:40 PM) Pt states she is still taking this medication. Per dispense report, LF 12/02/2021 #30, 30 DS.  potassium chloride SA (KLOR-CON M) 20 MEQ tablet [id number] No TAKE 1 TABLET(20 MEQ) BY MOUTH DAILY  Patient taking differently: Take 20 mEq by mouth in the morning.   Nafziger, Kandee Keen, NP Taking Active Self, Pharmacy Records            SDOH:  (Social Determinants of Health) assessments and interventions performed: Yes SDOH Interventions    Flowsheet Row Care Coordination from 06/26/2022 in [facility] Clinical Support from 04/30/2022 in [facility] [place] Telephone from 02/16/2022 in [facility] Coordination Chronic Care Management from 01/31/2022 in [facility] [place] Chronic Care Management from 11/28/2021 in [facility] [place] Clinical Support from 04/27/2021 in [facility] [place]  SDOH Interventions        Food Insecurity Interventions Intervention Not Indicated Intervention Not Indicated Intervention Not Indicated Intervention Not Indicated -- Intervention Not Indicated  Housing Interventions Intervention Not Indicated Intervention Not Indicated -- Intervention Not Indicated -- Intervention Not Indicated  Transportation Interventions -- Intervention Not Indicated Intervention Not Indicated Intervention Not Indicated -- Intervention Not  Indicated  Utilities Interventions -- Intervention Not Indicated -- Intervention Not Indicated -- --  Alcohol Usage Interventions -- Intervention Not Indicated (Score <7) -- -- -- --  Financial Strain Interventions -- Intervention Not Indicated -- Intervention Not Indicated Intervention Not Indicated Intervention Not Indicated  Physical Activity Interventions -- Patient Refused, Other (Comments) -- -- -- Intervention Not Indicated  Stress Interventions -- Intervention Not Indicated -- Intervention Not Indicated -- Intervention Not Indicated  Social Connections Interventions -- Intervention Not Indicated -- Intervention Not Indicated -- Intervention Not Indicated       Medication Assistance: None required.  Patient affirms current coverage meets needs.  Medication Access: Within the past 30 days, how often has patient missed a dose of medication? None Is a pillbox or other method used to improve adherence? Yes  Factors that may affect medication adherence? no barriers identified Are meds synced by current pharmacy? No  Are meds delivered by current pharmacy? No  Does patient experience delays in picking up medications due to transportation concerns? No   [facility] Reviewed: Is patient disadvantaged to use [facility]?: Yes  Current Rx insurance plan: HTA Name and location of Current pharmacy:  [id number] Gin[place] Otto, [place] - [address] AT [facility] [address] [facility] [address] [place] [address] Phone: [phone] Fax: [phone]  [facility] [address] [place] [address] Phone: [phone] Fax: [phone]  [facility] services reviewed with patient today?: No  Patient requests to transfer care to [facility]?: No  Reason patient declined to change pharmacies: Disadvantaged due to insurance/mail order  Compliance/Adherence/Medication fill history: Care Gaps: AWV - completed  04/30/2022 Tdap - overdue Covid - overdue  Star-Rating Drugs: Losartan HCTZ 100/12.5 mg - last filled 04/27/2022 90 DS at [facility]   Assessment/Plan   Hypertension (BP goal <140/90) -Controlled -Current treatment: Losartan-HCTZ  100-12.5mg  1 qd Appropriate, Effective, Safe, Accessible Amlodipine 2.5mg  1 qd Appropriate, Effective, Safe, Accessible -Medications previously tried:  Lisinopril -Current home readings: checking infrequently but reports when she does they are generally in the 130s/80s -Current dietary habits: mindful of salt intake -Current exercise habits: not discussed -Denies hypotensive/hypertensive symptoms -Educated on BP goals and benefits of medications for prevention of heart attack, stroke and kidney damage; Daily salt intake goal < 2300 mg; Importance of home blood pressure monitoring; Proper BP monitoring technique; -Counseled to monitor BP at home once weekly, document, and provide log at future appointments -Recommended to continue current medication  Hx of PE/DVT US controlled/uncontrolled: Controlled Current Medication Therapy: Eliquis 5mg  BID Appropriate, Effective, Safe, Accessible Medications Previously Tried: None Chest Pain in last 3 months: No Any signs of bleed? No Counseled on: Importance of adherence to Eliquis and risk of bleed   Sherrill Raring Clinical Pharmacist [phone]

## 2022-06-25 NOTE — Progress Notes (Signed)
Care Management & Coordination Services Pharmacy Team  Reason for Encounter: Appointment Reminder  Contacted patient to confirm telephone appointment with Delano Metz, PharmD on 06/26/2022 at 3:30. Spoke with patient on 06/25/2022   Do you have any problems getting your medications? Yes If yes what types of problems are you experiencing?  Just issues with pharmacies having medications in stock, she states the issues are currently resolving.   What is your top health concern you would like to discuss at your upcoming visit? Patient denies  Have you seen any other providers since your last visit with PCP? Patient denies  Care Gaps: AWV - completed 04/30/2022 Last BP - 130/68 on 04/26/2022 Tdap - overdue Covid - overdue   Star Rating Drug: Losartan HCTZ 100/12.5 mg - last filled 04/27/2022 90 DS at [facility]  Clinical Pharmacist Assistant [phone]

## 2022-06-26 ENCOUNTER — Ambulatory Visit: Payer: PPO

## 2022-07-05 ENCOUNTER — Other Ambulatory Visit (HOSPITAL_COMMUNITY): Payer: Self-pay

## 2022-09-13 DIAGNOSIS — H401131 Primary open-angle glaucoma, bilateral, mild stage: Secondary | ICD-10-CM | POA: Diagnosis not present

## 2022-10-25 ENCOUNTER — Other Ambulatory Visit: Payer: Self-pay | Admitting: Adult Health

## 2022-10-25 DIAGNOSIS — I10 Essential (primary) hypertension: Secondary | ICD-10-CM

## 2022-10-30 ENCOUNTER — Ambulatory Visit (INDEPENDENT_AMBULATORY_CARE_PROVIDER_SITE_OTHER): Payer: PPO | Admitting: Adult Health

## 2022-10-30 ENCOUNTER — Encounter: Payer: Self-pay | Admitting: Adult Health

## 2022-10-30 VITALS — BP 110/60 | HR 77 | Temp 97.8°F | Ht 64.0 in | Wt 214.0 lb

## 2022-10-30 DIAGNOSIS — M150 Primary generalized (osteo)arthritis: Secondary | ICD-10-CM

## 2022-10-30 DIAGNOSIS — E118 Type 2 diabetes mellitus with unspecified complications: Secondary | ICD-10-CM

## 2022-10-30 DIAGNOSIS — M1000 Idiopathic gout, unspecified site: Secondary | ICD-10-CM

## 2022-10-30 DIAGNOSIS — H409 Unspecified glaucoma: Secondary | ICD-10-CM

## 2022-10-30 DIAGNOSIS — Z0000 Encounter for general adult medical examination without abnormal findings: Secondary | ICD-10-CM

## 2022-10-30 DIAGNOSIS — M159 Polyosteoarthritis, unspecified: Secondary | ICD-10-CM | POA: Diagnosis not present

## 2022-10-30 DIAGNOSIS — Z23 Encounter for immunization: Secondary | ICD-10-CM | POA: Diagnosis not present

## 2022-10-30 DIAGNOSIS — N3281 Overactive bladder: Secondary | ICD-10-CM | POA: Diagnosis not present

## 2022-10-30 DIAGNOSIS — G8929 Other chronic pain: Secondary | ICD-10-CM

## 2022-10-30 DIAGNOSIS — I10 Essential (primary) hypertension: Secondary | ICD-10-CM | POA: Diagnosis not present

## 2022-10-30 DIAGNOSIS — M25561 Pain in right knee: Secondary | ICD-10-CM

## 2022-10-30 LAB — COMPREHENSIVE METABOLIC PANEL
ALT: 11 U/L (ref 0–35)
AST: 12 U/L (ref 0–37)
Albumin: 3.6 g/dL (ref 3.5–5.2)
Alkaline Phosphatase: 68 U/L (ref 39–117)
BUN: 16 mg/dL (ref 6–23)
CO2: 29 meq/L (ref 19–32)
Calcium: 9.6 mg/dL (ref 8.4–10.5)
Chloride: 103 meq/L (ref 96–112)
Creatinine, Ser: 0.72 mg/dL (ref 0.40–1.20)
GFR: 76.12 mL/min (ref >60.00)
Glucose, Bld: 115 mg/dL — ABNORMAL HIGH (ref 70–99)
Potassium: 4.2 meq/L (ref 3.5–5.1)
Sodium: 141 meq/L (ref 135–145)
Total Bilirubin: 1.1 mg/dL (ref 0.2–1.2)
Total Protein: 7.2 g/dL (ref 6.0–8.3)

## 2022-10-30 LAB — CBC WITH DIFFERENTIAL/PLATELET
Basophils Absolute: 0 10*3/uL (ref 0.0–0.1)
Basophils Relative: 0.5 % (ref 0.0–3.0)
Eosinophils Absolute: 0.4 10*3/uL (ref 0.0–0.7)
Eosinophils Relative: 4.2 % (ref 0.0–5.0)
HCT: 41.4 % (ref 36.0–46.0)
Hemoglobin: 13.5 g/dL (ref 12.0–15.0)
Lymphocytes Relative: 27.2 % (ref 12.0–46.0)
Lymphs Abs: 2.5 10*3/uL (ref 0.7–4.0)
MCHC: 32.6 g/dL (ref 30.0–36.0)
MCV: 89.4 fl (ref 78.0–100.0)
Monocytes Absolute: 0.6 10*3/uL (ref 0.1–1.0)
Monocytes Relative: 6.4 % (ref 3.0–12.0)
Neutro Abs: 5.7 10*3/uL (ref 1.4–7.7)
Neutrophils Relative %: 61.7 % (ref 43.0–77.0)
Platelets: 360 10*3/uL (ref 150.0–400.0)
RBC: 4.63 Mil/uL (ref 3.87–5.11)
RDW: 13.9 % (ref 11.5–15.5)
WBC: 9.3 10*3/uL (ref 4.0–10.5)

## 2022-10-30 LAB — LIPID PANEL
Cholesterol: 130 mg/dL (ref 0–200)
HDL: 39.6 mg/dL (ref >39.00)
LDL Cholesterol: 73 mg/dL (ref 0–99)
NonHDL: 90.79
Total CHOL/HDL Ratio: 3
Triglycerides: 88 mg/dL (ref 0.0–149.0)
VLDL: 17.6 mg/dL (ref 0.0–40.0)

## 2022-10-30 LAB — HEMOGLOBIN A1C: Hgb A1c MFr Bld: 6.4 % (ref 4.6–6.5)

## 2022-10-30 MED ORDER — ALLOPURINOL 100 MG PO TABS: ORAL_TABLET | ORAL | 3 refills | Status: DC

## 2022-10-30 MED ORDER — MIRABEGRON ER 50 MG PO TB24: 50.0000 mg | ORAL_TABLET | Freq: Every morning | ORAL | 11 refills | Status: DC

## 2022-10-30 MED ORDER — METHYLPREDNISOLONE ACETATE 80 MG/ML IJ SUSP
80.0000 mg | Freq: Once | INTRAMUSCULAR | Status: AC
  Administered 2022-10-30: 80 mg via INTRA_ARTICULAR

## 2022-10-30 MED ORDER — POTASSIUM CHLORIDE CRYS ER 20 MEQ PO TBCR: 20.0000 meq | EXTENDED_RELEASE_TABLET | Freq: Every morning | ORAL | 3 refills | Status: DC

## 2022-10-30 NOTE — Addendum Note (Signed)
Addended by: Waymon Amato R on: 10/30/2022 08:43 AM   Modules accepted: Orders

## 2022-10-30 NOTE — Patient Instructions (Signed)
It was great seeing you today   We will follow up with you regarding your lab work   Please let me know if you need anything   

## 2022-10-30 NOTE — Progress Notes (Signed)
Subjective:    Patient ID: Kristina Allison, female    DOB: October 23, 1935, 87 y.o.   MRN: [id number]  HPI Patient presents for yearly preventative medicine examination. She is a very pleasant 87 year old female who  has a past medical history of COLONIC POLYPS, HX OF (11/28/2006), DVT (03/10/2007), Edema (02/21/2007), ESOPHAGITIS NOS (09/27/2006), GOUT (06/04/2007), HIP PAIN, LEFT (01/30/2007), HYPERTENSION (11/28/2006), OBESITY NOS (09/27/2006), OSTEOARTHRITIS (11/28/2006), OSTEOARTHROSIS, GENERALIZED, UNSPC SITE (09/27/2006), PHLEBITIS&THROMBOPHLEBITIS LOWER EXTREM UNSPEC (03/07/2007), STATE, SYMPTOMATIC MENOPAUSE/FEM CLIMACTERIC (09/27/2006), and VENOUS INSUFFICIENCY, CHRONIC, RIGHT LEG (09/06/2009).  DM Type 2 - has been diet controlled for quite some time. She has been working on reducing carbs and sugars.  Lab Results  Component Value Date   HGBA1C 6.3 (A) 04/26/2022   HGBA1C 6.3 (A) 01/25/2022   HGBA1C 7.0 (H) 10/26/2021    Hypertension-well-controlled with Norvasc 2.5 mg and losartan/HCTZ 100-12.5 mg.  She is supplemented with potassium.  She denies chest pain, shortness of breath, dizziness, lightheadedness, or syncopal episodes BP Readings from Last 3 Encounters:  10/30/22 110/60  04/26/22 130/68  02/22/22 130/80    History of gout-managed with allopurinol 100 mg daily.  She denies any recent gout flares  Osteoarthritis-mostly in her hips and knees.  Managed with Tylenol as needed OAB-well-controlled with Myrbetriq 50 mg daily  Glaucoma-managed by [facility].  She denies any changes in vision or eye pressure since her last exam in June 2022. Prescribed Latanoprost 0.005% 1 drop in both eyes at bedtime  Chronic right knee pain - she has responded well to steroid injections in the past. Her last injection was roughly 7  months ago. Pain started to come back a few weeks ago. She is wondering if she can have another steroid injection today   All immunizations and health maintenance protocols were  reviewed with the patient and needed orders were placed.  Appropriate screening laboratory values were ordered for the patient including screening of hyperlipidemia, renal function and hepatic function.  Medication reconciliation,  past medical history, social history, problem list and allergies were reviewed in detail with the patient  Goals were established with regard to weight loss, exercise, and  diet in compliance with medications Wt Readings from Last 3 Encounters:  10/30/22 214 lb (97.1 kg)  04/30/22 219 lb (99.3 kg)  04/26/22 219 lb (99.3 kg)   Review of Systems  Constitutional: Negative.   HENT: Negative.    Eyes: Negative.   Respiratory: Negative.    Cardiovascular: Negative.   Gastrointestinal: Negative.   Endocrine: Negative.   Genitourinary: Negative.   Musculoskeletal:  Positive for arthralgias.  Skin: Negative.   Allergic/Immunologic: Negative.   Neurological: Negative.   Hematological: Negative.   Psychiatric/Behavioral: Negative.     Past Medical History:  Diagnosis Date   COLONIC POLYPS, HX OF 11/28/2006   DVT 03/10/2007   Edema 02/21/2007   ESOPHAGITIS NOS 09/27/2006   GOUT 06/04/2007   HIP PAIN, LEFT 01/30/2007   HYPERTENSION 11/28/2006   OBESITY NOS 09/27/2006   OSTEOARTHRITIS 11/28/2006   OSTEOARTHROSIS, GENERALIZED, UNSPC SITE 09/27/2006   PHLEBITIS&THROMBOPHLEBITIS LOWER EXTREM UNSPEC 03/07/2007   STATE, SYMPTOMATIC MENOPAUSE/FEM CLIMACTERIC 09/27/2006   VENOUS INSUFFICIENCY, CHRONIC, RIGHT LEG 09/06/2009    Social History   Socioeconomic History   Marital status: Widowed    Spouse name: Not on file   Number of children: 7   Years of education: 12   Highest education level: High school graduate  Occupational History   Occupation: retired  Tobacco Use  Smoking status: Never   Smokeless tobacco: Never  Substance and Sexual Activity   Alcohol use: No   Drug use: No   Sexual activity: Not on file  Other Topics Concern   Not on file  Social History  Narrative   Widowed   HH 2 : son lives with her   Likes word searches, reading, television   Social Determinants of Health   Financial Resource Strain: Low Risk  (04/30/2022)   Overall Financial Resource Strain (CARDIA)    Difficulty of Paying Living Expenses: Not hard at all  Food Insecurity: No Food Insecurity (06/27/2022)   Hunger Vital Sign    Worried About Running Out of Food in the Last Year: Never true    Ran Out of Food in the Last Year: Never true  Transportation Needs: No Transportation Needs (04/30/2022)   PRAPARE - Administrator, Civil Service (Medical): No    Lack of Transportation (Non-Medical): No  Physical Activity: Inactive (04/30/2022)   Exercise Vital Sign    Days of Exercise per Week: 0 days    Minutes of Exercise per Session: 0 min  Stress: No Stress Concern Present (04/30/2022)   [employer] of Occupational Health - Occupational Stress Questionnaire    Feeling of Stress : Not at all  Social Connections: Moderately Integrated (04/30/2022)   Social Connection and Isolation Panel [NHANES]    Frequency of Communication with Friends and Family: More than three times a week    Frequency of Social Gatherings with Friends and Family: More than three times a week    Attends Religious Services: More than 4 times per year    Active Member of [employer] or Organizations: Yes    Attends Banker Meetings: More than 4 times per year    Marital Status: Widowed  Intimate Partner Violence: Not At Risk (04/30/2022)   Humiliation, Afraid, Rape, and Kick questionnaire    Fear of Current or Ex-Partner: No    Emotionally Abused: No    Physically Abused: No    Sexually Abused: No    Past Surgical History:  Procedure Laterality Date   ABDOMINAL HYSTERECTOMY      History reviewed. No pertinent family history.  Allergies  Allergen Reactions   Cipro [Ciprofloxacin Hcl] Nausea Only and Other (See Comments)    Made pt feel like she was [place]    Macrobid [Nitrofurantoin Macrocrystal] Nausea Only and Other (See Comments)    Made pt feel like she was [place]   Sulfacetamide Sodium Itching and Rash   Sulfamethoxazole-Trimethoprim Itching and Rash    Current Outpatient Medications on File Prior to Visit  Medication Sig Dispense Refill   acetaminophen (TYLENOL) 325 MG tablet Take 2 tablets (650 mg total) by mouth every 6 (six) hours as needed for mild pain (or Fever >/= 101).     allopurinol (ZYLOPRIM) 100 MG tablet TAKE 1 TABLET(100 MG) BY MOUTH DAILY (Patient taking differently: Take 100 mg by mouth in the morning.) 90 tablet 3   amLODipine (NORVASC) 2.5 MG tablet TAKE 1 TABLET(2.5 MG) BY MOUTH DAILY 90 tablet 3   apixaban (ELIQUIS) 5 MG TABS tablet TAKE 1 TABLET BY MOUTH TWICE DAILY 180 tablet 2   Cholecalciferol (VITAMIN D-3 PO) Take 1 capsule by mouth in the morning.     latanoprost (XALATAN) 0.005 % ophthalmic solution INSTILL 1 DROP IN BOTH EYES AT BEDTIME (Patient taking differently: Place 1 drop into both eyes at bedtime.) 10 mL 1  losartan-hydrochlorothiazide (HYZAAR) 100-12.5 MG tablet TAKE 1 TABLET BY MOUTH DAILY 90 tablet 3   MYRBETRIQ 50 MG TB24 tablet TAKE 1 TABLET(50 MG) BY MOUTH DAILY (Patient taking differently: Take 50 mg by mouth in the morning.) 30 tablet 11   potassium chloride SA (KLOR-CON M) 20 MEQ tablet TAKE 1 TABLET(20 MEQ) BY MOUTH DAILY (Patient taking differently: Take 20 mEq by mouth in the morning.) 90 tablet 3   No current facility-administered medications on file prior to visit.    BP 110/60   Pulse 77   Temp 97.8 F (36.6 C) (Oral)   Ht 5\' 4"  (1.626 m)   Wt 214 lb (97.1 kg)   SpO2 97%   BMI 36.73 kg/m       Objective:   Physical Exam Vitals and nursing note reviewed.  Constitutional:      General: She is not in acute distress.    Appearance: Normal appearance. She is obese. She is not ill-appearing.  HENT:     Head: Normocephalic and atraumatic.     Right Ear: Tympanic membrane,  ear canal and external ear normal. There is no impacted cerumen.     Left Ear: Tympanic membrane, ear canal and external ear normal. There is no impacted cerumen.     Nose: Nose normal. No congestion or rhinorrhea.     Mouth/Throat:     Mouth: Mucous membranes are moist.     Pharynx: Oropharynx is clear.  Eyes:     Extraocular Movements: Extraocular movements intact.     Conjunctiva/sclera: Conjunctivae normal.     Pupils: Pupils are equal, round, and reactive to light.  Neck:     Vascular: No carotid bruit.  Cardiovascular:     Rate and Rhythm: Normal rate and regular rhythm.     Pulses: Normal pulses.     Heart sounds: No murmur heard.    No friction rub. No gallop.  Pulmonary:     Effort: Pulmonary effort is normal.     Breath sounds: Normal breath sounds.  Abdominal:     General: Abdomen is flat. Bowel sounds are normal. There is no distension.     Palpations: Abdomen is soft. There is no mass.     Tenderness: There is no abdominal tenderness. There is no guarding or rebound.     Hernia: No hernia is present.  Musculoskeletal:        General: Normal range of motion.     Cervical back: Normal range of motion and neck supple.  Lymphadenopathy:     Cervical: No cervical adenopathy.  Skin:    General: Skin is warm and dry.     Capillary Refill: Capillary refill takes less than 2 seconds.  Neurological:     General: No focal deficit present.     Mental Status: She is alert and oriented to person, place, and time.  Psychiatric:        Mood and Affect: Mood normal.        Behavior: Behavior normal.        Thought Content: Thought content normal.        Judgment: Judgment normal.        Assessment & Plan:  1. Routine general medical examination at a health care facility Today patient counseled on age appropriate routine health concerns for screening and prevention, each reviewed and up to date or declined. Immunizations reviewed and up to date or declined. Labs ordered and  reviewed. Risk factors for depression reviewed and negative. Hearing  function and visual acuity are intact. ADLs screened and addressed as needed. Functional ability and level of safety reviewed and appropriate. Education, counseling and referrals performed based on assessed risks today. Patient provided with a copy of personalized plan for preventive services.   2. Controlled type 2 diabetes mellitus with complication, without long-term current use of insulin (HCC) -Consider metformin  - CBC with Differential/Platelet; Future - Comprehensive metabolic panel; Future - Lipid panel; Future - TSH; Future - Hemoglobin A1c; Future  3. Essential hypertension - Well controlled. No change in medication  - CBC with Differential/Platelet; Future - Comprehensive metabolic panel; Future - Lipid panel; Future - TSH; Future - Hemoglobin A1c; Future - potassium chloride SA (KLOR-CON M) 20 MEQ tablet; Take 1 tablet (20 mEq total) by mouth in the morning.  Dispense: 90 tablet; Refill: 3  4. Idiopathic gout, unspecified chronicity, unspecified site - No recent  - CBC with Differential/Platelet; Future - Comprehensive metabolic panel; Future - Lipid panel; Future - TSH; Future - Hemoglobin A1c; Future - allopurinol (ZYLOPRIM) 100 MG tablet; TAKE 1 TABLET(100 MG) BY MOUTH DAILY  Dispense: 90 tablet; Refill: 3  5. OAB (overactive bladder) - Continue with Myrbetriq  - CBC with Differential/Platelet; Future - Comprehensive metabolic panel; Future - Lipid panel; Future - TSH; Future - Hemoglobin A1c; Future - mirabegron ER (MYRBETRIQ) 50 MG TB24 tablet; Take 1 tablet (50 mg total) by mouth in the morning.  Dispense: 30 tablet; Refill: 11  6. Primary osteoarthritis involving multiple joints - Continue with Tylenol as needed - CBC with Differential/Platelet; Future - Comprehensive metabolic panel; Future - Lipid panel; Future - TSH; Future - Hemoglobin A1c; Future  7. Glaucoma of both eyes,  unspecified glaucoma type - Per [facility]  - CBC with Differential/Platelet; Future - Comprehensive metabolic panel; Future - Lipid panel; Future - TSH; Future - Hemoglobin A1c; Future  8. Chronic pain of right knee Discussed risks and benefits of corticosteroid injection and patient consented.  After prepping skin with betadine, injected 80 mg depomedrol and 2 cc of plain xylocaine with 22 gauge one and one half inch needle using anterolateral approach and pt tolerated well.  - methylPREDNISolone acetate (DEPO-MEDROL) injection 80 mg  Shirline Frees, NP

## 2022-10-31 LAB — TSH: TSH: 2.39 u[IU]/mL (ref 0.35–5.50)

## 2022-11-19 ENCOUNTER — Other Ambulatory Visit: Payer: Self-pay | Admitting: Adult Health

## 2022-11-19 DIAGNOSIS — N3281 Overactive bladder: Secondary | ICD-10-CM

## 2022-11-26 ENCOUNTER — Telehealth: Payer: Self-pay | Admitting: Adult Health

## 2022-11-26 NOTE — Telephone Encounter (Signed)
Pt need a diagnostic mammogram and ultrasound for right breast fax to [facility] phone number is [phone].  Pt has an appt on Thursday

## 2022-11-27 NOTE — Telephone Encounter (Signed)
Ok for order?  

## 2022-11-28 ENCOUNTER — Telehealth: Payer: Self-pay | Admitting: Adult Health

## 2022-11-28 ENCOUNTER — Other Ambulatory Visit: Payer: Self-pay

## 2022-11-28 DIAGNOSIS — Z1231 Encounter for screening mammogram for malignant neoplasm of breast: Secondary | ICD-10-CM

## 2022-11-28 NOTE — Telephone Encounter (Signed)
Pt call back and stated [facility] canceled her appt because they didn't get the order and reschedule it for the eighth.

## 2022-11-28 NOTE — Telephone Encounter (Signed)
Shanda Bumps from [facility] [employer] called inquiring about the order needing signed for patient for her mammogram for 11/29/2022.  Please advise ASAP [phone], best number to fax [phone]

## 2022-11-28 NOTE — Telephone Encounter (Signed)
Called and spoke to someone else. Pt order was placed earlier.

## 2022-11-28 NOTE — Telephone Encounter (Signed)
Patient notified of update  and verbalized understanding. 

## 2022-11-28 NOTE — Telephone Encounter (Signed)
Spoke to [place] and she stated she just needed an ultrasound order with the mammogram. Order filled out and faxed back to requested number.

## 2022-11-28 NOTE — Telephone Encounter (Signed)
Order placed

## 2022-12-04 ENCOUNTER — Encounter: Payer: Self-pay | Admitting: Adult Health

## 2022-12-04 DIAGNOSIS — R928 Other abnormal and inconclusive findings on diagnostic imaging of breast: Secondary | ICD-10-CM | POA: Diagnosis not present

## 2022-12-04 LAB — HM MAMMOGRAPHY

## 2023-01-03 ENCOUNTER — Other Ambulatory Visit: Payer: Self-pay

## 2023-01-03 NOTE — Patient Outreach (Signed)
  Care Management   Visit Note  01/03/2023 Name: Kristina Allison MRN: [id number] DOB: 1936-02-11  Subjective: Kristina Allison is a 87 y.o. year old female who is a primary care patient of Shirline Frees, NP. The Care Management team was consulted for assistance.      Brief outreach with Kristina Allison regarding care management needs. Anticipates being available to complete outreach on 01/15/23. Prefers morning outreach. Agreed to call or contact the clinic if she requires assistance prior to the scheduled call.  PLAN Will follow up on 01/15/23   Juanell Fairly [facility]  [facility], [facility] Health RN Care Manager Direct Dial: [phone] Website: [email or web address]

## 2023-01-15 ENCOUNTER — Other Ambulatory Visit: Payer: Self-pay

## 2023-01-15 NOTE — Patient Outreach (Unsigned)
  Care Management   Visit Note  01/15/2023 Name: Kristina Allison MRN: [id number] DOB: 01-02-1936  Subjective: Kristina Allison is a 87 y.o. year old female who is a primary care patient of Shirline Frees, NP. The Care Management team was consulted for assistance.      Engaged with patient via telephone  Assessment:  Outpatient Encounter Medications as of 01/15/2023  Medication Sig   acetaminophen (TYLENOL) 325 MG tablet Take 2 tablets (650 mg total) by mouth every 6 (six) hours as needed for mild pain (or Fever >/= 101).   allopurinol (ZYLOPRIM) 100 MG tablet TAKE 1 TABLET(100 MG) BY MOUTH DAILY   amLODipine (NORVASC) 2.5 MG tablet TAKE 1 TABLET(2.5 MG) BY MOUTH DAILY   apixaban (ELIQUIS) 5 MG TABS tablet TAKE 1 TABLET BY MOUTH TWICE DAILY   Cholecalciferol (VITAMIN D-3 PO) Take 1 capsule by mouth in the morning.   latanoprost (XALATAN) 0.005 % ophthalmic solution INSTILL 1 DROP IN BOTH EYES AT BEDTIME (Patient taking differently: Place 1 drop into both eyes at bedtime.)   losartan-hydrochlorothiazide (HYZAAR) 100-12.5 MG tablet TAKE 1 TABLET BY MOUTH DAILY   MYRBETRIQ 50 MG TB24 tablet TAKE 1 TABLET(50 MG) BY MOUTH DAILY   potassium chloride SA (KLOR-CON M) 20 MEQ tablet Take 1 tablet (20 mEq total) by mouth in the morning.   No facility-administered encounter medications on file as of 01/15/2023.    Interventions:

## 2023-01-15 NOTE — Patient Instructions (Signed)
Thank you for allowing the Care Management team to participate in your care.  It was great speaking with you today!

## 2023-01-22 ENCOUNTER — Other Ambulatory Visit: Payer: Self-pay | Admitting: Adult Health

## 2023-01-22 DIAGNOSIS — I10 Essential (primary) hypertension: Secondary | ICD-10-CM

## 2023-02-25 ENCOUNTER — Telehealth: Payer: Self-pay

## 2023-02-25 ENCOUNTER — Other Ambulatory Visit: Payer: Self-pay

## 2023-02-25 NOTE — Patient Outreach (Signed)
  Care Management   Outreach Note  02/25/2023 Name: Kristina Allison MRN: [id number] DOB: Feb 13, 1936  An unsuccessful outreach attempt was made today for a scheduled Care Management visit.    Follow Up Plan:  A HIPAA compliant phone message was left for the patient providing contact information and requesting a return call.    [facility]  [facility], [facility] RN Care Manager Direct Dial: [phone] Website: [email or web address]

## 2023-02-28 ENCOUNTER — Telehealth: Payer: Self-pay

## 2023-02-28 NOTE — Telephone Encounter (Signed)
 Copied from CRM [id number]. Topic: General - Other >> Feb 25, 2023 10:15 AM Russell PARAS wrote: Reason for CRM: Pt had appt at 10 AM with Patient Outreach this morning and did not receive a call. She would like to reschedule the call for later this afternoon if possible. CB# [phone] (H)

## 2023-03-05 NOTE — Telephone Encounter (Signed)
 Pt advised that we do not handle appt. From out reach. Pt verbalized understanding.

## 2023-03-14 ENCOUNTER — Other Ambulatory Visit: Payer: Self-pay

## 2023-03-14 ENCOUNTER — Other Ambulatory Visit: Payer: Self-pay | Admitting: Adult Health

## 2023-03-14 NOTE — Telephone Encounter (Signed)
Copied from CRM [id number]. Topic: Clinical - Medication Refill >> Mar 14, 2023  8:45 AM Leavy Cella D wrote: Most Recent Primary Care Visit:  Provider: Shirline Frees  Department: [facility]  Visit Type: PHYSICAL  Date: 10/30/2022  Medication: apixaban (ELIQUIS) 5 MG TABS tablet  Has the patient contacted their pharmacy? Yes (Agent: If no, request that the patient contact the pharmacy for the refill. If patient does not wish to contact the pharmacy document the reason why and proceed with request.) (Agent: If yes, when and what did the pharmacy advise?)  Is this the correct pharmacy for this prescription? Yes If no, delete pharmacy and type the correct one.  This is the patient's preferred pharmacy:  [id number] [place], [place] - [address] AT [facility] [address] [facility] [address] [place] [address] Phone: [phone] Fax: [phone]    Has the prescription been filled recently? No  Is the patient out of the medication? No  Has the patient been seen for an appointment in the last year OR does the patient have an upcoming appointment? Yes  Can we respond through MyChart? Yes  Agent: Please be advised that Rx refills may take up to 3 business days. We ask that you follow-up with your pharmacy.

## 2023-03-15 MED ORDER — APIXABAN 5 MG PO TABS: 5.0000 mg | ORAL_TABLET | Freq: Two times a day (BID) | ORAL | 2 refills | Status: DC

## 2023-03-18 NOTE — Patient Outreach (Addendum)
Care Management   Visit Note   Name: Kristina Allison MRN: [id number] DOB: [birth year]  Subjective: Kristina Allison is a 88 y.o. year old female who is a primary care patient of Shirline Frees, NP. The Care Management team was consulted for assistance.      Engaged with Ms. Cheslock via telephone.  Assessment:  Review of patient past medical history, allergies, medications, health status, including review of consultants reports, laboratory and other test data, was performed as part of  evaluation and provision of care management services.   Outpatient Encounter Medications as of 03/14/2023  Medication Sig   acetaminophen (TYLENOL) 325 MG tablet Take 2 tablets (650 mg total) by mouth every 6 (six) hours as needed for mild pain (or Fever >/= 101).   allopurinol (ZYLOPRIM) 100 MG tablet TAKE 1 TABLET(100 MG) BY MOUTH DAILY   amLODipine (NORVASC) 2.5 MG tablet TAKE 1 TABLET(2.5 MG) BY MOUTH DAILY   Cholecalciferol (VITAMIN D-3 PO) Take 1 capsule by mouth in the morning.   losartan-hydrochlorothiazide (HYZAAR) 100-12.5 MG tablet TAKE 1 TABLET BY MOUTH DAILY   MYRBETRIQ 50 MG TB24 tablet TAKE 1 TABLET(50 MG) BY MOUTH DAILY   potassium chloride SA (KLOR-CON M) 20 MEQ tablet TAKE 1 TABLET(20 MEQ) BY MOUTH DAILY   [DISCONTINUED] apixaban (ELIQUIS) 5 MG TABS tablet TAKE 1 TABLET BY MOUTH TWICE DAILY   latanoprost (XALATAN) 0.005 % ophthalmic solution INSTILL 1 DROP IN BOTH EYES AT BEDTIME (Patient taking differently: Place 1 drop into both eyes at bedtime.)   No facility-administered encounter medications on file as of 03/14/2023.     Goals Addressed             This Visit's Progress    Monitor, Self-Manage And Reduce Symptoms of Hypertension and Diabetes       Current Barriers:  Care Management support and education needs related to Hypertension  Planned Interventions: Planned Interventions: Reviewed plan for hypertension management. Reviewed medications. Reports taking all medications  as prescribed. Denies concerns related to medication management. Agreed to keep updated of concerns related to prescription costs.  Provided education regarding blood pressure parameters and importance of monitoring blood pressures consistently. Reports monitoring as advised. Notes readings have been within range. Reports reading today was 128/72. Reports heart rates have been stable in the 70s. Reviewed established parameters and indications for notifying a provider. Reviewed symptoms. Denies chest pain, palpitations, or shortness of breath. Denies recent headaches or visual changes. Reviewed s/sx of heart attack, stroke and worsening symptoms that require immediate medical attention. BP Readings from Last 3 Encounters:  10/30/22 110/60  04/26/22 130/68  02/22/22 130/80   Lab Results  Component Value Date   CHOL 130 10/30/2022   HDL 39.60 10/30/2022   LDLCALC 73 10/30/2022   TRIG 88.0 10/30/2022   CHOLHDL 3 10/30/2022   Wt Readings from Last 3 Encounters:  10/30/22 214 lb (97.1 kg)  04/30/22 219 lb (99.3 kg)  04/26/22 219 lb (99.3 kg)     Symptom Management: Take medications exactly as prescribed   Attend all scheduled provider appointments Call pharmacy for medication refills 3-7 days in advance of running out of medications Monitor BP and maintain a log Continue adhering to cardiac prudent diet and limiting sodium intake Call provider office for new concerns or questions Seek immediate medical attention for worsening symptoms or if you experience a head injury.   ============================================== Current Barriers:   Management support and education needs related to Diabetes  Planned Interventions: Reviewed provider's plan  for diabetes management. Reports doing very well. Remains controlled with without medication. A1C is within goal at 6.4%. Discussed nutritional intake and importance of complying with a diabetic diet. Reports attempting to adhere to recommended  diet. Monitoring intake of carbohydrates and added sugars. Declined need for additional nutritional resources. Discussed importance of completing recommended DM preventive care. Reports completing recommended foot care. Reports eye exam is up to date.  Discussed importance of completing ordered labs as prescribed.   Lab Results  Component Value Date   HGBA1C 6.4 10/30/2022    Symptom Management: Attend all scheduled provider appointments Continue adhering to diabetic diet Read food labels for fat, fiber, carbohydrates and portion size Wash and dry feet carefully every day Check feet daily for cuts, sores or redness Wear comfortable, cotton socks Wear comfortable, well-fitting shoes Call provider office for new concerns or questions Seek immediate medical attention for worsening symptoms              PLAN Will follow up next month   Juanell Fairly [facility] Health RN Care Manager Direct Dial: [phone]  Fax: [phone] Website: [email or web address]

## 2023-04-11 ENCOUNTER — Telehealth: Payer: Self-pay

## 2023-04-11 NOTE — Patient Outreach (Signed)
  Care Management   Outreach Note  04/11/2023 Name: Kristina Allison MRN: [id number] DOB: October 28, 1935  An unsuccessful telephone outreach was attempted today to contact the patient about Care Management needs.    Follow Up Plan:  A HIPAA compliant phone message was left for the patient providing contact information and requesting a return call.    Juanell Fairly [facility] Population Health RN Care Manager Direct Dial: [phone]  Fax: [phone] Website: [email or web address]

## 2023-04-22 ENCOUNTER — Other Ambulatory Visit: Payer: Self-pay | Admitting: Adult Health

## 2023-04-22 DIAGNOSIS — I10 Essential (primary) hypertension: Secondary | ICD-10-CM

## 2023-04-30 ENCOUNTER — Ambulatory Visit (INDEPENDENT_AMBULATORY_CARE_PROVIDER_SITE_OTHER): Payer: PPO | Admitting: Adult Health

## 2023-04-30 ENCOUNTER — Encounter: Payer: Self-pay | Admitting: Adult Health

## 2023-04-30 VITALS — BP 120/80 | HR 90 | Temp 97.6°F | Resp 16 | Wt 209.0 lb

## 2023-04-30 DIAGNOSIS — E118 Type 2 diabetes mellitus with unspecified complications: Secondary | ICD-10-CM

## 2023-04-30 DIAGNOSIS — I10 Essential (primary) hypertension: Secondary | ICD-10-CM | POA: Diagnosis not present

## 2023-04-30 LAB — POCT GLYCOSYLATED HEMOGLOBIN (HGB A1C): Hemoglobin A1C: 6.3 % — AB (ref 4.0–5.6)

## 2023-04-30 NOTE — Patient Instructions (Signed)
 Your A1c was 6.3! Keep it up!   I will see you back in 6 months for your physical exam

## 2023-04-30 NOTE — Progress Notes (Signed)
 Subjective:    Patient ID: Kristina Allison, female    DOB: Mar 12, 1935, 88 y.o.   MRN: [id number]  HPI 88 year old female who  has a past medical history of COLONIC POLYPS, HX OF (11/28/2006), DVT (03/10/2007), Edema (02/21/2007), ESOPHAGITIS NOS (09/27/2006), GOUT (06/04/2007), HIP PAIN, LEFT (01/30/2007), HYPERTENSION (11/28/2006), OBESITY NOS (09/27/2006), OSTEOARTHRITIS (11/28/2006), OSTEOARTHROSIS, GENERALIZED, UNSPC SITE (09/27/2006), PHLEBITIS&THROMBOPHLEBITIS LOWER EXTREM UNSPEC (03/07/2007), STATE, SYMPTOMATIC MENOPAUSE/FEM CLIMACTERIC (09/27/2006), and VENOUS INSUFFICIENCY, CHRONIC, RIGHT LEG (09/06/2009).  She presents to the office today for six month follow up   DM Type 2 - has been diet controlled for quite some time. She has been working on reducing carbs and sugars.  Lab Results  Component Value Date   HGBA1C 6.4 10/30/2022   HGBA1C 6.3 (A) 04/26/2022   HGBA1C 6.3 (A) 01/25/2022   Hypertension-well-controlled with Norvasc 2.5 mg and losartan/HCTZ 100-12.5 mg.  She is supplemented with potassium.  She denies chest pain, shortness of breath, dizziness, lightheadedness, or syncopal episodes BP Readings from Last 3 Encounters:  04/30/23 120/80  10/30/22 110/60  04/26/22 130/68    Review of Systems Past Medical History:  Diagnosis Date   COLONIC POLYPS, HX OF 11/28/2006   DVT 03/10/2007   Edema 02/21/2007   ESOPHAGITIS NOS 09/27/2006   GOUT 06/04/2007   HIP PAIN, LEFT 01/30/2007   HYPERTENSION 11/28/2006   OBESITY NOS 09/27/2006   OSTEOARTHRITIS 11/28/2006   OSTEOARTHROSIS, GENERALIZED, UNSPC SITE 09/27/2006   PHLEBITIS&THROMBOPHLEBITIS LOWER EXTREM UNSPEC 03/07/2007   STATE, SYMPTOMATIC MENOPAUSE/FEM CLIMACTERIC 09/27/2006   VENOUS INSUFFICIENCY, CHRONIC, RIGHT LEG 09/06/2009    Social History   Socioeconomic History   Marital status: Widowed    Spouse name: Not on file   Number of children: 7   Years of education: 12   Highest education level: High school graduate  Occupational History    Occupation: retired  Tobacco Use   Smoking status: Never   Smokeless tobacco: Never  Substance and Sexual Activity   Alcohol use: No   Drug use: No   Sexual activity: Not on file  Other Topics Concern   Not on file  Social History Narrative   Widowed   HH 2 : son lives with her   Likes word searches, reading, television   Social Drivers of Health   Financial Resource Strain: Low Risk  (01/15/2023)   Overall Financial Resource Strain (CARDIA)    Difficulty of Paying Living Expenses: Not very hard  Food Insecurity: No Food Insecurity (03/14/2023)   Hunger Vital Sign    Worried About Running Out of Food in the Last Year: Never true    Ran Out of Food in the Last Year: Never true  Transportation Needs: No Transportation Needs (01/15/2023)   PRAPARE - Administrator, Civil Service (Medical): No    Lack of Transportation (Non-Medical): No  Physical Activity: Inactive (01/15/2023)   Exercise Vital Sign    Days of Exercise per Week: 0 days    Minutes of Exercise per Session: 0 min  Stress: No Stress Concern Present (01/15/2023)   [employer] of Occupational Health - Occupational Stress Questionnaire    Feeling of Stress : Not at all  Social Connections: Moderately Integrated (01/15/2023)   Social Connection and Isolation Panel [NHANES]    Frequency of Communication with Friends and Family: More than three times a week    Frequency of Social Gatherings with Friends and Family: More than three times a week    Attends Religious Services:  More than 4 times per year    Active Member of Clubs or Organizations: Yes    Attends Banker Meetings: More than 4 times per year    Marital Status: Widowed  Intimate Partner Violence: Not At Risk (01/15/2023)   Humiliation, Afraid, Rape, and Kick questionnaire    Fear of Current or Ex-Partner: No    Emotionally Abused: No    Physically Abused: No    Sexually Abused: No    Past Surgical History:  Procedure  Laterality Date   ABDOMINAL HYSTERECTOMY      No family history on file.  Allergies  Allergen Reactions   Cipro [Ciprofloxacin Hcl] Nausea Only and Other (See Comments)    Made pt feel like she was [place]   Macrobid [Nitrofurantoin Macrocrystal] Nausea Only and Other (See Comments)    Made pt feel like she was [place]"   Sulfacetamide Sodium Itching and Rash   Sulfamethoxazole-Trimethoprim Itching and Rash    Current Outpatient Medications on File Prior to Visit  Medication Sig Dispense Refill   acetaminophen (TYLENOL) 325 MG tablet Take 2 tablets (650 mg total) by mouth every 6 (six) hours as needed for mild pain (or Fever >/= 101).     allopurinol (ZYLOPRIM) 100 MG tablet TAKE 1 TABLET(100 MG) BY MOUTH DAILY 90 tablet 3   amLODipine (NORVASC) 2.5 MG tablet TAKE 1 TABLET(2.5 MG) BY MOUTH DAILY 90 tablet 3   apixaban (ELIQUIS) 5 MG TABS tablet Take 1 tablet (5 mg total) by mouth 2 (two) times daily. 180 tablet 2   Cholecalciferol (VITAMIN D-3 PO) Take 1 capsule by mouth in the morning.     latanoprost (XALATAN) 0.005 % ophthalmic solution INSTILL 1 DROP IN BOTH EYES AT BEDTIME (Patient taking differently: Place 1 drop into both eyes at bedtime.) 10 mL 1   losartan-hydrochlorothiazide (HYZAAR) 100-12.5 MG tablet TAKE 1 TABLET BY MOUTH DAILY 90 tablet 3   MYRBETRIQ 50 MG TB24 tablet TAKE 1 TABLET(50 MG) BY MOUTH DAILY 30 tablet 11   potassium chloride SA (KLOR-CON M) 20 MEQ tablet TAKE 1 TABLET(20 MEQ) BY MOUTH DAILY 90 tablet 3   No current facility-administered medications on file prior to visit.    BP 120/80   Pulse 90   Temp 97.6 F (36.4 C)   Resp 16   Wt 209 lb (94.8 kg)   SpO2 95%   BMI 35.87 kg/m       Objective:   Physical Exam Vitals and nursing note reviewed.  Constitutional:      Appearance: Normal appearance. She is obese.  Cardiovascular:     Rate and Rhythm: Normal rate and regular rhythm.     Pulses: Normal pulses.     Heart sounds:  Normal heart sounds.  Pulmonary:     Effort: Pulmonary effort is normal.     Breath sounds: Normal breath sounds.  Skin:    General: Skin is warm and dry.  Neurological:     General: No focal deficit present.     Mental Status: She is alert and oriented to person, place, and time.  Psychiatric:        Mood and Affect: Mood normal.        Behavior: Behavior normal.        Thought Content: Thought content normal.        Judgment: Judgment normal.        Assessment & Plan:  1. Controlled type 2 diabetes mellitus with complication, without long-term  current use of insulin (HCC) (Primary)  - POC HgB A1c- 6.3 - Continue with dietary modifications  - Follow up in 6 months for CPE   2. Essential hypertension - Well controled. No change in medication    Shirline Frees, NP

## 2023-05-13 ENCOUNTER — Ambulatory Visit (INDEPENDENT_AMBULATORY_CARE_PROVIDER_SITE_OTHER): Payer: PPO

## 2023-05-13 VITALS — Ht 64.0 in | Wt 209.0 lb

## 2023-05-13 DIAGNOSIS — Z0000 Encounter for general adult medical examination without abnormal findings: Secondary | ICD-10-CM | POA: Diagnosis not present

## 2023-05-13 NOTE — Progress Notes (Signed)
 Subjective:   Kristina Allison is a 88 y.o. who presents for a Medicare Wellness preventive visit.  Visit Complete: Virtual I connected with  Kristina Allison on 05/13/23 by a audio enabled telemedicine application and verified that I am speaking with the correct person using two identifiers.  Patient Location: Home  Provider Location: Home Office  I discussed the limitations of evaluation and management by telemedicine. The patient expressed understanding and agreed to proceed.  Vital Signs: Because this visit was a virtual/telehealth visit, some criteria may be missing or patient reported. Any vitals not documented were not able to be obtained and vitals that have been documented are patient reported.  VideoDeclined- This patient declined Librarian, academic. Therefore the visit was completed with audio only.  Persons Participating in Visit: Patient.  AWV Questionnaire: No: Patient Medicare AWV questionnaire was not completed prior to this visit.  Cardiac Risk Factors include: advanced age (>38men, >36 women);hypertension     Objective:    Today's Vitals   05/13/23 0842  Weight: 209 lb (94.8 kg)  Height: 5\' 4"  (1.626 m)   Body mass index is 35.87 kg/m.     05/13/2023    8:48 AM 04/30/2022    9:29 AM 02/14/2022   10:54 AM 02/13/2022    5:19 PM 04/27/2021    9:12 AM 04/26/2020    9:05 AM 04/06/2019    9:13 AM  Advanced Directives  Does Patient Have a Medical Advance Directive? Yes Yes Yes No Yes Yes Yes  Type of Estate agent of [place];Living will Healthcare Power of [place];Living will Living will;Healthcare Power of [employer] Power of [place];Living will Healthcare Power of [place];Living will Healthcare Power of [place];Living will  Does patient want to make changes to medical advance directive? No - Patient declined No - Patient declined No - Guardian declined  No - Patient declined No - Patient declined No -  Patient declined  Copy of Healthcare Power of Attorney in Chart? Yes - validated most recent copy scanned in chart (See row information) Yes - validated most recent copy scanned in chart (See row information) No - copy requested  No - copy requested No - copy requested No - copy requested  Would patient like information on creating a medical advance directive?    No - Patient declined       Current Medications (verified) Outpatient Encounter Medications as of 05/13/2023  Medication Sig   acetaminophen (TYLENOL) 325 MG tablet Take 2 tablets (650 mg total) by mouth every 6 (six) hours as needed for mild pain (or Fever >/= 101).   allopurinol (ZYLOPRIM) 100 MG tablet TAKE 1 TABLET(100 MG) BY MOUTH DAILY   amLODipine (NORVASC) 2.5 MG tablet TAKE 1 TABLET(2.5 MG) BY MOUTH DAILY   apixaban (ELIQUIS) 5 MG TABS tablet Take 1 tablet (5 mg total) by mouth 2 (two) times daily.   Cholecalciferol (VITAMIN D-3 PO) Take 1 capsule by mouth in the morning.   latanoprost (XALATAN) 0.005 % ophthalmic solution INSTILL 1 DROP IN BOTH EYES AT BEDTIME (Patient taking differently: Place 1 drop into both eyes at bedtime.)   losartan-hydrochlorothiazide (HYZAAR) 100-12.5 MG tablet TAKE 1 TABLET BY MOUTH DAILY   MYRBETRIQ 50 MG TB24 tablet TAKE 1 TABLET(50 MG) BY MOUTH DAILY   potassium chloride SA (KLOR-CON M) 20 MEQ tablet TAKE 1 TABLET(20 MEQ) BY MOUTH DAILY   No facility-administered encounter medications on file as of 05/13/2023.    Allergies (verified) Cipro [ciprofloxacin  hcl], Macrobid [nitrofurantoin macrocrystal], Sulfacetamide sodium, and Sulfamethoxazole-trimethoprim   History: Past Medical History:  Diagnosis Date   COLONIC POLYPS, HX OF 11/28/2006   DVT 03/10/2007   Edema 02/21/2007   ESOPHAGITIS NOS 09/27/2006   GOUT 06/04/2007   HIP PAIN, LEFT 01/30/2007   HYPERTENSION 11/28/2006   OBESITY NOS 09/27/2006   OSTEOARTHRITIS 11/28/2006   OSTEOARTHROSIS, GENERALIZED, UNSPC SITE 09/27/2006    PHLEBITIS&THROMBOPHLEBITIS LOWER EXTREM UNSPEC 03/07/2007   STATE, SYMPTOMATIC MENOPAUSE/FEM CLIMACTERIC 09/27/2006   VENOUS INSUFFICIENCY, CHRONIC, RIGHT LEG 09/06/2009   Past Surgical History:  Procedure Laterality Date   ABDOMINAL HYSTERECTOMY     History reviewed. No pertinent family history. Social History   Socioeconomic History   Marital status: Widowed    Spouse name: Not on file   Number of children: 7   Years of education: 12   Highest education level: High school graduate  Occupational History   Occupation: retired  Tobacco Use   Smoking status: Never   Smokeless tobacco: Never  Substance and Sexual Activity   Alcohol use: No   Drug use: No   Sexual activity: Not on file  Other Topics Concern   Not on file  Social History Narrative   Widowed   HH 2 : son lives with her   Likes word searches, reading, television   Social Drivers of Health   Financial Resource Strain: Low Risk  (05/13/2023)   Overall Financial Resource Strain (CARDIA)    Difficulty of Paying Living Expenses: Not hard at all  Food Insecurity: No Food Insecurity (05/13/2023)   Hunger Vital Sign    Worried About Running Out of Food in the Last Year: Never true    Ran Out of Food in the Last Year: Never true  Transportation Needs: No Transportation Needs (05/13/2023)   PRAPARE - Administrator, Civil Service (Medical): No    Lack of Transportation (Non-Medical): No  Physical Activity: Inactive (05/13/2023)   Exercise Vital Sign    Days of Exercise per Week: 0 days    Minutes of Exercise per Session: 0 min  Stress: No Stress Concern Present (05/13/2023)   [employer] of Occupational Health - Occupational Stress Questionnaire    Feeling of Stress : Not at all  Social Connections: Moderately Integrated (05/13/2023)   Social Connection and Isolation Panel [NHANES]    Frequency of Communication with Friends and Family: More than three times a week    Frequency of Social Gatherings with  Friends and Family: More than three times a week    Attends Religious Services: More than 4 times per year    Active Member of [employer] or Organizations: Yes    Attends Banker Meetings: More than 4 times per year    Marital Status: Widowed    Tobacco Counseling Counseling given: Not Answered    Clinical Intake:  Pre-visit preparation completed: Yes  Pain : No/denies pain     BMI - recorded: 35.87 Nutritional Status: BMI > 30  Obese Nutritional Risks: None Diabetes: No  How often do you need to have someone help you when you read instructions, pamphlets, or other written materials from your doctor or pharmacy?: 1 - Never  Interpreter Needed?: No  Information entered by :: Theresa Mulligan LPN   Activities of Daily Living     05/13/2023    8:46 AM  In your present state of health, do you have any difficulty performing the following activities:  Hearing? 0  Vision? 0  Difficulty  concentrating or making decisions? 0  Walking or climbing stairs? 0  Dressing or bathing? 0  Doing errands, shopping? 0  Preparing Food and eating ? N  Using the Toilet? N  In the past six months, have you accidently leaked urine? Y  Comment Wears Pads. Followed by medical attention  Do you have problems with loss of bowel control? N  Managing your Medications? N  Managing your Finances? N  Housekeeping or managing your Housekeeping? N    Patient Care Team: Shirline Frees, NP as PCP - General (Family Medicine) Leitha Bleak, Milas Kocher, [facility] (Pharmacist) Juanell Fairly, RN as Case Manager  Indicate any recent Medical Services you may have received from other than [facility] providers in the past year (date may be approximate).     Assessment:   This is a routine wellness examination for Kristina Allison.  Hearing/Vision screen Hearing Screening - Comments:: Denies hearing difficulties   Vision Screening - Comments:: Wears rx glasses - up to date with routine eye exams with  [facility]    Goals Addressed               This Visit's Progress     Remain Active (pt-stated)         Depression Screen     05/13/2023    8:46 AM 01/15/2023   11:23 AM 10/30/2022    8:04 AM 04/30/2022    9:26 AM 01/31/2022    2:20 PM 09/11/2021    3:34 PM 04/27/2021    9:08 AM  PHQ 2/9 Scores  PHQ - 2 Score 0 0 0 0 0 0 0  PHQ- 9 Score   2   0     Fall Risk     05/13/2023    8:47 AM 04/30/2022    9:29 AM 01/31/2022    2:20 PM 09/11/2021    3:34 PM 04/27/2021    9:11 AM  Fall Risk   Falls in the past year? 0 0 0 0 0  Number falls in past yr: 0 0 0 0 0  Injury with Fall? 0 0 0 0 0  Risk for fall due to : No Fall Risks No Fall Risks Medication side effect;Other (Comment) No Fall Risks No Fall Risks  Risk for fall due to: Comment   Reports having a ramp to enter home.    Follow up Falls prevention discussed;Falls evaluation completed Falls prevention discussed Falls prevention discussed Falls prevention discussed     MEDICARE RISK AT HOME:  Medicare Risk at Home Any stairs in or around the home?: No If so, are there any without handrails?: No Home free of loose throw rugs in walkways, pet beds, electrical cords, etc?: Yes Adequate lighting in your home to reduce risk of falls?: Yes Life alert?: No Use of a cane, walker or w/c?: No Grab bars in the bathroom?: No Shower chair or bench in shower?: Yes Elevated toilet seat or a handicapped toilet?: No  TIMED UP AND GO:  Was the test performed?  No  Cognitive Function: 6CIT completed        05/13/2023    8:48 AM 04/30/2022    9:29 AM 04/27/2021    9:13 AM 04/06/2019    9:15 AM  6CIT Screen  What Year? 0 points 0 points 0 points 0 points  What month? 0 points 0 points 0 points 0 points  What time? 0 points 0 points 0 points 0 points  Count back from 20 0 points 0 points 0  points 0 points  Months in reverse 0 points 0 points 0 points 0 points  Repeat phrase 0 points 0 points 0 points 0 points  Total Score 0 points 0 points 0 points 0  points    Immunizations Immunization History  Administered Date(s) Administered   Fluad Quad(high Dose 65+) 11/22/2018, 09/16/2020, 11/07/2021   Fluad Trivalent(High Dose 65+) 10/30/2022   Influenza Split 11/29/2011   Influenza Whole 11/18/2007, 12/05/2009, 11/07/2010   Influenza, High Dose Seasonal PF 12/10/2012, 12/23/2014, 12/14/2015, 12/04/2016, 11/29/2017   Influenza,inj,Quad PF,6+ Mos 12/07/2013   Influenza-Unspecified 11/25/2019   PFIZER(Purple Top)SARS-COV-2 Vaccination 04/03/2019, 04/28/2019, 12/02/2019, 09/16/2020   Pfizer Covid-19 Vaccine Bivalent Booster 37yrs & up 11/18/2021   Pneumococcal Conjugate-13 02/02/2013   Pneumococcal Polysaccharide-23 07/29/2008   Td 07/29/2008   Tdap 08/24/2022   Zoster Recombinant(Shingrix) 11/18/2021, 08/24/2022    Screening Tests Health Maintenance  Topic Date Due   COVID-19 Vaccine (6 - 2024-25 season) 10/28/2022   MAMMOGRAM  12/04/2023   Medicare Annual Wellness (AWV)  05/12/2024   DTaP/Tdap/Td (3 - Td or Tdap) 08/23/2032   Pneumonia Vaccine 72+ Years old  Completed   INFLUENZA VACCINE  Completed   DEXA SCAN  Completed   HPV VACCINES  Aged Out   Zoster Vaccines- Shingrix  Discontinued    Health Maintenance  Health Maintenance Due  Topic Date Due   COVID-19 Vaccine (6 - 2024-25 season) 10/28/2022   Health Maintenance Items Addressed:    Additional Screening:  Vision Screening: Recommended annual ophthalmology exams for early detection of glaucoma and other disorders of the eye.  Dental Screening: Recommended annual dental exams for proper oral hygiene  Community Resource Referral / Chronic Care Management: CRR required this visit?  No   CCM required this visit?  No     Plan:     I have personally reviewed and noted the following in the patient's chart:   Medical and social history Use of alcohol, tobacco or illicit drugs  Current medications and supplements including opioid prescriptions. Patient is not  currently taking opioid prescriptions. Functional ability and status Nutritional status Physical activity Advanced directives List of other physicians Hospitalizations, surgeries, and ER visits in previous 12 months Vitals Screenings to include cognitive, depression, and falls Referrals and appointments  In addition, I have reviewed and discussed with patient certain preventive protocols, quality metrics, and best practice recommendations. A written personalized care plan for preventive services as well as general preventive health recommendations were provided to patient.     Tillie Rung, LPN   7/84/6962   After Visit Summary: (MyChart) Due to this being a telephonic visit, the after visit summary with patients personalized plan was offered to patient via MyChart   Notes: Nothing significant to report at this time.

## 2023-05-13 NOTE — Patient Instructions (Addendum)
 Kristina Allison , Thank you for taking time to come for your Medicare Wellness Visit. I appreciate your ongoing commitment to your health goals. Please review the following plan we discussed and let me know if I can assist you in the future.   Referrals/Orders/Follow-Ups/Clinician Recommendations:   This is a list of the screening recommended for you and due dates:  Health Maintenance  Topic Date Due   COVID-19 Vaccine (6 - 2024-25 season) 10/28/2022   Mammogram  12/04/2023   Medicare Annual Wellness Visit  05/12/2024   DTaP/Tdap/Td vaccine (3 - Td or Tdap) 08/23/2032   Pneumonia Vaccine  Completed   Flu Shot  Completed   DEXA scan (bone density measurement)  Completed   HPV Vaccine  Aged Out   Zoster (Shingles) Vaccine  Discontinued    Advanced directives: (In Chart) A copy of your advanced directives are scanned into your chart should your provider ever need it.  Next Medicare Annual Wellness Visit scheduled for next year: Yes

## 2023-05-15 ENCOUNTER — Other Ambulatory Visit: Payer: Self-pay

## 2023-05-15 NOTE — Patient Instructions (Signed)
 Thank you for allowing the Care Management team to participate in your care. It was great speaking with you today!   We will follow up on August 15, 2023 at 0900. Please do not hesitate to contact me if you require assistance prior to the next outreach.    Juanell Fairly [facility] Population [facility] RN Care Manager Direct Dial: [phone]  Fax: [phone] Website: [email or web address]

## 2023-05-15 NOTE — Patient Outreach (Unsigned)
  Care Management   Visit Note  05/15/2023 Name: Kristina Allison MRN: [id number] DOB: October 16, 1935  Subjective: Kristina Allison is a 88 y.o. year old female who is a primary care patient of Kristina Frees, NP. Engaged with Kristina Allison via telephone.  Assessment:  Review of patient past medical history, allergies, medications, health status, including review of consultants reports, laboratory and other test data, was performed as part of  evaluation and provision of care management services.   Outpatient Encounter Medications as of 05/15/2023  Medication Sig   acetaminophen (TYLENOL) 325 MG tablet Take 2 tablets (650 mg total) by mouth every 6 (six) hours as needed for mild pain (or Fever >/= 101).   allopurinol (ZYLOPRIM) 100 MG tablet TAKE 1 TABLET(100 MG) BY MOUTH DAILY   amLODipine (NORVASC) 2.5 MG tablet TAKE 1 TABLET(2.5 MG) BY MOUTH DAILY   apixaban (ELIQUIS) 5 MG TABS tablet Take 1 tablet (5 mg total) by mouth 2 (two) times daily.   Cholecalciferol (VITAMIN D-3 PO) Take 1 capsule by mouth in the morning.   latanoprost (XALATAN) 0.005 % ophthalmic solution INSTILL 1 DROP IN BOTH EYES AT BEDTIME (Patient taking differently: Place 1 drop into both eyes at bedtime.)   losartan-hydrochlorothiazide (HYZAAR) 100-12.5 MG tablet TAKE 1 TABLET BY MOUTH DAILY   MYRBETRIQ 50 MG TB24 tablet TAKE 1 TABLET(50 MG) BY MOUTH DAILY   potassium chloride SA (KLOR-CON M) 20 MEQ tablet TAKE 1 TABLET(20 MEQ) BY MOUTH DAILY   No facility-administered encounter medications on file as of 05/15/2023.     Interventions:  Interventions Today    Flowsheet Row Most Recent Value  Chronic Disease   Chronic disease during today's visit Diabetes, Hypertension (HTN)  General Interventions   General Interventions Discussed/Reviewed General Interventions Discussed, Annual Eye Exam, Labs, Annual Foot Exam, Lipid Profile, Doctor Visits  Labs Hgb A1c every 6 months, Kidney Function  Doctor Visits Discussed/Reviewed  Doctor Visits Reviewed  Exercise Interventions   Exercise Discussed/Reviewed Physical Activity  Physical Activity Discussed/Reviewed Physical Activity Discussed  Education Interventions   Education Provided Provided Education  Provided Verbal Education On Nutrition, Eye Care, Labs, Medication, When to see the doctor  Labs Reviewed Hgb A1c, Kidney Function, Lipid Profile  Mental Health Interventions   Mental Health Discussed/Reviewed Mental Health Reviewed  Nutrition Interventions   Nutrition Discussed/Reviewed Nutrition Reviewed, Adding fruits and vegetables, Fluid intake, Portion sizes, Decreasing sugar intake, Increasing proteins, Decreasing fats, Decreasing salt  Pharmacy Interventions   Pharmacy Dicussed/Reviewed Pharmacy Topics Reviewed, Medications and their functions  Safety Interventions   Safety Discussed/Reviewed Safety Reviewed, Fall Risk  Advanced Directive Interventions   Advanced Directives Discussed/Reviewed Advanced Directives Reviewed  [Has documents on file]

## 2023-08-05 DIAGNOSIS — H401131 Primary open-angle glaucoma, bilateral, mild stage: Secondary | ICD-10-CM | POA: Diagnosis not present

## 2023-08-15 ENCOUNTER — Other Ambulatory Visit: Payer: Self-pay

## 2023-09-18 ENCOUNTER — Telehealth: Payer: Self-pay

## 2023-09-18 NOTE — Patient Instructions (Signed)
 Kristina Allison - I am sorry I was unable to reach you today for our scheduled appointment. I work with Merna, Darleene, NP and am calling to support your healthcare needs. Please contact me at [phone] at your earliest convenience. I look forward to speaking with you soon.   Thank you,   Jackson Acron [facility] RN Care Manager Direct Dial: [phone]  Fax: [phone] Website: [email or web address]

## 2023-10-17 ENCOUNTER — Other Ambulatory Visit: Payer: Self-pay | Admitting: Adult Health

## 2023-10-17 DIAGNOSIS — I10 Essential (primary) hypertension: Secondary | ICD-10-CM

## 2023-10-31 ENCOUNTER — Encounter: Payer: Self-pay | Admitting: Adult Health

## 2023-10-31 ENCOUNTER — Ambulatory Visit: Admitting: Adult Health

## 2023-10-31 ENCOUNTER — Ambulatory Visit: Payer: Self-pay | Admitting: Adult Health

## 2023-10-31 VITALS — BP 110/60 | HR 80 | Temp 97.8°F | Ht 64.0 in | Wt 209.0 lb

## 2023-10-31 DIAGNOSIS — Z86711 Personal history of pulmonary embolism: Secondary | ICD-10-CM

## 2023-10-31 DIAGNOSIS — M1000 Idiopathic gout, unspecified site: Secondary | ICD-10-CM

## 2023-10-31 DIAGNOSIS — E119 Type 2 diabetes mellitus without complications: Secondary | ICD-10-CM

## 2023-10-31 DIAGNOSIS — N3281 Overactive bladder: Secondary | ICD-10-CM

## 2023-10-31 DIAGNOSIS — M150 Primary generalized (osteo)arthritis: Secondary | ICD-10-CM

## 2023-10-31 DIAGNOSIS — Z23 Encounter for immunization: Secondary | ICD-10-CM | POA: Diagnosis not present

## 2023-10-31 DIAGNOSIS — H409 Unspecified glaucoma: Secondary | ICD-10-CM | POA: Diagnosis not present

## 2023-10-31 DIAGNOSIS — I10 Essential (primary) hypertension: Secondary | ICD-10-CM | POA: Diagnosis not present

## 2023-10-31 DIAGNOSIS — Z0000 Encounter for general adult medical examination without abnormal findings: Secondary | ICD-10-CM | POA: Diagnosis not present

## 2023-10-31 LAB — CBC WITH DIFFERENTIAL/PLATELET
Basophils Absolute: 0.1 K/uL (ref 0.0–0.1)
Basophils Relative: 0.9 % (ref 0.0–3.0)
Eosinophils Absolute: 0.3 K/uL (ref 0.0–0.7)
Eosinophils Relative: 3.2 % (ref 0.0–5.0)
HCT: 42.2 % (ref 36.0–46.0)
Hemoglobin: 14 g/dL (ref 12.0–15.0)
Lymphocytes Relative: 26.5 % (ref 12.0–46.0)
Lymphs Abs: 2.5 K/uL (ref 0.7–4.0)
MCHC: 33.2 g/dL (ref 30.0–36.0)
MCV: 89.4 fl (ref 78.0–100.0)
Monocytes Absolute: 0.6 K/uL (ref 0.1–1.0)
Monocytes Relative: 6 % (ref 3.0–12.0)
Neutro Abs: 6 K/uL (ref 1.4–7.7)
Neutrophils Relative %: 63.4 % (ref 43.0–77.0)
Platelets: 365 K/uL (ref 150.0–400.0)
RBC: 4.72 Mil/uL (ref 3.87–5.11)
RDW: 14.1 % (ref 11.5–15.5)
WBC: 9.4 K/uL (ref 4.0–10.5)

## 2023-10-31 LAB — LIPID PANEL
Cholesterol: 145 mg/dL (ref 0–200)
HDL: 51.3 mg/dL (ref >39.00)
LDL Cholesterol: 79 mg/dL (ref 0–99)
NonHDL: 93.32
Total CHOL/HDL Ratio: 3
Triglycerides: 73 mg/dL (ref 0.0–149.0)
VLDL: 14.6 mg/dL (ref 0.0–40.0)

## 2023-10-31 LAB — COMPREHENSIVE METABOLIC PANEL WITH GFR
ALT: 13 U/L (ref 0–35)
AST: 19 U/L (ref 0–37)
Albumin: 3.8 g/dL (ref 3.5–5.2)
Alkaline Phosphatase: 64 U/L (ref 39–117)
BUN: 19 mg/dL (ref 6–23)
CO2: 30 meq/L (ref 19–32)
Calcium: 9.5 mg/dL (ref 8.4–10.5)
Chloride: 103 meq/L (ref 96–112)
Creatinine, Ser: 0.66 mg/dL (ref 0.40–1.20)
GFR: 79.3 mL/min (ref >60.00)
Glucose, Bld: 102 mg/dL — ABNORMAL HIGH (ref 70–99)
Potassium: 3.8 meq/L (ref 3.5–5.1)
Sodium: 141 meq/L (ref 135–145)
Total Bilirubin: 1.5 mg/dL — ABNORMAL HIGH (ref 0.2–1.2)
Total Protein: 7.1 g/dL (ref 6.0–8.3)

## 2023-10-31 LAB — MICROALBUMIN / CREATININE URINE RATIO
Creatinine,U: 44.1 mg/dL
Microalb Creat Ratio: 19.1 mg/g (ref 0.0–30.0)
Microalb, Ur: 0.8 mg/dL (ref 0.0–1.9)

## 2023-10-31 LAB — HEMOGLOBIN A1C: Hgb A1c MFr Bld: 6.6 % — ABNORMAL HIGH (ref 4.6–6.5)

## 2023-10-31 LAB — TSH: TSH: 0.6 u[IU]/mL (ref 0.35–5.50)

## 2023-10-31 MED ORDER — APIXABAN 5 MG PO TABS: 5.0000 mg | ORAL_TABLET | Freq: Two times a day (BID) | ORAL | 3 refills | Status: DC

## 2023-10-31 MED ORDER — ALLOPURINOL 100 MG PO TABS: 100.0000 mg | ORAL_TABLET | Freq: Every day | ORAL | 3 refills | Status: AC

## 2023-10-31 MED ORDER — MIRABEGRON ER 50 MG PO TB24: 50.0000 mg | ORAL_TABLET | Freq: Every day | ORAL | 3 refills | Status: DC

## 2023-10-31 NOTE — Patient Instructions (Signed)
 It was great seeing you today   We will follow up with you regarding your lab work   Please let me know if you need anything

## 2023-10-31 NOTE — Addendum Note (Signed)
 Addended by: MERNA DARLEENE CROME on: 10/31/2023 10:28 AM   Modules accepted: Level of Service

## 2023-10-31 NOTE — Progress Notes (Signed)
 Subjective:    Patient ID: Kristina Allison, female    DOB: 1935-04-09, 88 y.o.   MRN: [id number]  HPI Patient presents for yearly preventative medicine examination. She is a pleasant 88 year old female who  has a past medical history of COLONIC POLYPS, HX OF (11/28/2006), DVT (03/10/2007), Edema (02/21/2007), ESOPHAGITIS NOS (09/27/2006), GOUT (06/04/2007), HIP PAIN, LEFT (01/30/2007), HYPERTENSION (11/28/2006), OBESITY NOS (09/27/2006), OSTEOARTHRITIS (11/28/2006), OSTEOARTHROSIS, GENERALIZED, UNSPC SITE (09/27/2006), PHLEBITIS&THROMBOPHLEBITIS LOWER EXTREM UNSPEC (03/07/2007), STATE, SYMPTOMATIC MENOPAUSE/FEM CLIMACTERIC (09/27/2006), and VENOUS INSUFFICIENCY, CHRONIC, RIGHT LEG (09/06/2009).  DM Type 2 - has been diet controlled for quite some time. She has been working on reducing carbs and sugars.  Lab Results  Component Value Date   HGBA1C 6.3 (A) 04/30/2023   HGBA1C 6.4 10/30/2022   HGBA1C 6.3 (A) 04/26/2022   Hypertension-well-controlled with Norvasc  2.5 mg and losartan /HCTZ 100-12.5 mg.  She is supplemented with potassium.  She denies chest pain, shortness of breath, dizziness, lightheadedness, or syncopal episodes BP Readings from Last 3 Encounters:  10/31/23 110/60  04/30/23 120/80  10/30/22 110/60   History of PE  initially in 2009 and she was treated with 6 months of Eliquis  for unprovoked DVT.  In December 2023 she presented to the ER for her second episode of PE.  She was placed on Eliquis  5 mg p.o. daily.  History of gout-managed with allopurinol  100 mg daily.  She denies any recent gout flares  Osteoarthritis-mostly in her hips and knees.  Managed with Tylenol  as needed  OAB-well-controlled with Myrbetriq  50 mg daily  Glaucoma-managed by [facility].  She denies any changes in vision or eye pressure since her last exam in June 2022. Prescribed Latanoprost  0.005% 1 drop in both eyes at bedtime    All immunizations and health maintenance protocols were reviewed with the patient and  needed orders were placed.  Appropriate screening laboratory values were ordered for the patient including screening of hyperlipidemia, renal function and hepatic function.  Medication reconciliation,  past medical history, social history, problem list and allergies were reviewed in detail with the patient  Goals were established with regard to weight loss, exercise, and  diet in compliance with medications  Wt Readings from Last 3 Encounters:  10/31/23 209 lb (94.8 kg)  05/13/23 209 lb (94.8 kg)  04/30/23 209 lb (94.8 kg)    Review of Systems  Constitutional: Negative.   HENT: Negative.    Eyes: Negative.   Respiratory: Negative.    Cardiovascular: Negative.   Gastrointestinal: Negative.   Endocrine: Negative.   Genitourinary: Negative.   Musculoskeletal:  Positive for arthralgias, back pain and gait problem.  Skin: Negative.   Allergic/Immunologic: Negative.   Hematological: Negative.   Psychiatric/Behavioral: Negative.     Past Medical History:  Diagnosis Date   COLONIC POLYPS, HX OF 11/28/2006   DVT 03/10/2007   Edema 02/21/2007   ESOPHAGITIS NOS 09/27/2006   GOUT 06/04/2007   HIP PAIN, LEFT 01/30/2007   HYPERTENSION 11/28/2006   OBESITY NOS 09/27/2006   OSTEOARTHRITIS 11/28/2006   OSTEOARTHROSIS, GENERALIZED, UNSPC SITE 09/27/2006   PHLEBITIS&THROMBOPHLEBITIS LOWER EXTREM UNSPEC 03/07/2007   STATE, SYMPTOMATIC MENOPAUSE/FEM CLIMACTERIC 09/27/2006   VENOUS INSUFFICIENCY, CHRONIC, RIGHT LEG 09/06/2009    Social History   Socioeconomic History   Marital status: Widowed    Spouse name: Not on file   Number of children: 7   Years of education: 12   Highest education level: High school graduate  Occupational History   Occupation: retired  Tobacco Use  Smoking status: Never   Smokeless tobacco: Never  Substance and Sexual Activity   Alcohol use: No   Drug use: No   Sexual activity: Not on file  Other Topics Concern   Not on file  Social History Narrative   Widowed   HH  2 : son lives with her   Likes word searches, reading, television   Social Drivers of Health   Financial Resource Strain: Low Risk  (05/15/2023)   Overall Financial Resource Strain (CARDIA)    Difficulty of Paying Living Expenses: Not very hard  Food Insecurity: No Food Insecurity (05/15/2023)   Hunger Vital Sign    Worried About Running Out of Food in the Last Year: Never true    Ran Out of Food in the Last Year: Never true  Transportation Needs: No Transportation Needs (05/15/2023)   PRAPARE - Administrator, Civil Service (Medical): No    Lack of Transportation (Non-Medical): No  Physical Activity: Inactive (05/13/2023)   Exercise Vital Sign    Days of Exercise per Week: 0 days    Minutes of Exercise per Session: 0 min  Stress: No Stress Concern Present (05/15/2023)   [employer] of Occupational Health - Occupational Stress Questionnaire    Feeling of Stress : Not at all  Social Connections: Moderately Integrated (05/13/2023)   Social Connection and Isolation Panel    Frequency of Communication with Friends and Family: More than three times a week    Frequency of Social Gatherings with Friends and Family: More than three times a week    Attends Religious Services: More than 4 times per year    Active Member of [employer] or Organizations: Yes    Attends Banker Meetings: More than 4 times per year    Marital Status: Widowed  Intimate Partner Violence: Not At Risk (05/15/2023)   Humiliation, Afraid, Rape, and Kick questionnaire    Fear of Current or Ex-Partner: No    Emotionally Abused: No    Physically Abused: No    Sexually Abused: No    Past Surgical History:  Procedure Laterality Date   ABDOMINAL HYSTERECTOMY      History reviewed. No pertinent family history.  Allergies  Allergen Reactions   Cipro  [Ciprofloxacin  Hcl] Nausea Only and Other (See Comments)    Made pt feel like she was in [place]   Macrobid  [Nitrofurantoin  Macrocrystal]  Nausea Only and Other (See Comments)    Made pt feel like she was in [place]   Sulfacetamide Sodium Itching and Rash   Sulfamethoxazole-Trimethoprim Itching and Rash    Current Outpatient Medications on File Prior to Visit  Medication Sig Dispense Refill   acetaminophen  (TYLENOL ) 325 MG tablet Take 2 tablets (650 mg total) by mouth every 6 (six) hours as needed for mild pain (or Fever >/= 101).     allopurinol  (ZYLOPRIM ) 100 MG tablet TAKE 1 TABLET(100 MG) BY MOUTH DAILY 90 tablet 3   amLODipine  (NORVASC ) 2.5 MG tablet TAKE 1 TABLET(2.5 MG) BY MOUTH DAILY 90 tablet 3   apixaban  (ELIQUIS ) 5 MG TABS tablet Take 1 tablet (5 mg total) by mouth 2 (two) times daily. 180 tablet 2   Cholecalciferol (VITAMIN D-3 PO) Take 1 capsule by mouth in the morning.     latanoprost  (XALATAN ) 0.005 % ophthalmic solution INSTILL 1 DROP IN BOTH EYES AT BEDTIME 10 mL 1   losartan -hydrochlorothiazide  (HYZAAR) 100-12.5 MG tablet TAKE 1 TABLET BY MOUTH DAILY 90 tablet 3   MYRBETRIQ  50 MG  TB24 tablet TAKE 1 TABLET(50 MG) BY MOUTH DAILY 30 tablet 11   potassium chloride  SA (KLOR-CON  M) 20 MEQ tablet TAKE 1 TABLET(20 MEQ) BY MOUTH DAILY 90 tablet 3   No current facility-administered medications on file prior to visit.    BP 110/60   Pulse 80   Temp 97.8 F (36.6 C) (Oral)   Ht 5' 4 (1.626 m)   Wt 209 lb (94.8 kg)   SpO2 97%   BMI 35.87 kg/m       Objective:   Physical Exam Vitals and nursing note reviewed.  Constitutional:      General: She is not in acute distress.    Appearance: Normal appearance. She is obese. She is not ill-appearing.  HENT:     Head: Normocephalic and atraumatic.     Right Ear: Tympanic membrane, ear canal and external ear normal. There is no impacted cerumen.     Left Ear: Tympanic membrane, ear canal and external ear normal. There is no impacted cerumen.     Nose: Nose normal. No congestion or rhinorrhea.     Mouth/Throat:     Mouth: Mucous membranes are moist.      Pharynx: Oropharynx is clear.  Eyes:     Extraocular Movements: Extraocular movements intact.     Conjunctiva/sclera: Conjunctivae normal.     Pupils: Pupils are equal, round, and reactive to light.  Neck:     Vascular: No carotid bruit.  Cardiovascular:     Rate and Rhythm: Normal rate and regular rhythm.     Pulses: Normal pulses.     Heart sounds: No murmur heard.    No friction rub. No gallop.  Pulmonary:     Effort: Pulmonary effort is normal.     Breath sounds: Normal breath sounds.  Abdominal:     General: Abdomen is flat. Bowel sounds are normal. There is no distension.     Palpations: Abdomen is soft. There is no mass.     Tenderness: There is no abdominal tenderness. There is no guarding or rebound.     Hernia: No hernia is present.  Musculoskeletal:        General: Normal range of motion.     Cervical back: Normal range of motion and neck supple.  Lymphadenopathy:     Cervical: No cervical adenopathy.  Skin:    General: Skin is warm and dry.     Capillary Refill: Capillary refill takes less than 2 seconds.  Neurological:     General: No focal deficit present.     Mental Status: She is alert and oriented to person, place, and time.     Gait: Gait abnormal.  Psychiatric:        Mood and Affect: Mood normal.        Behavior: Behavior normal.        Thought Content: Thought content normal.        Judgment: Judgment normal.        Assessment & Plan:  1. Routine general medical examination at a health care facility (Primary) Today patient counseled on age appropriate routine health concerns for screening and prevention, each reviewed and up to date or declined. Immunizations reviewed and up to date or declined. Labs ordered and reviewed. Risk factors for depression reviewed and negative. Hearing function and visual acuity are intact. ADLs screened and addressed as needed. Functional ability and level of safety reviewed and appropriate. Education, counseling and  referrals performed based on assessed risks today. Patient provided  with a copy of personalized plan for preventive services.   2. Diet-controlled diabetes mellitus (HCC) - Consider Metformin  - CBC with Differential/Platelet; Future - Comprehensive metabolic panel with GFR; Future - Lipid panel; Future - TSH; Future - Hemoglobin A1c; Future - Microalbumin/Creatinine Ratio, Urine; Future  3. Essential hypertension - Well controlled. No change in medications  - CBC with Differential/Platelet; Future - Comprehensive metabolic panel with GFR; Future - Lipid panel; Future - TSH; Future  4. History of pulmonary embolus (PE) - Continue with Eliquis   - CBC with Differential/Platelet; Future - Comprehensive metabolic panel with GFR; Future - Lipid panel; Future - TSH; Future  5. Idiopathic gout, unspecified chronicity, unspecified site - Continue with Allopurinol   - CBC with Differential/Platelet; Future - Comprehensive metabolic panel with GFR; Future - Lipid panel; Future - TSH; Future - allopurinol  (ZYLOPRIM ) 100 MG tablet; TAKE 1 TABLET(100 MG) BY MOUTH DAILY  Dispense: 90 tablet; Refill: 3  6. OAB (overactive bladder) - Controlled. Continue with Myrbetriq  - CBC with Differential/Platelet; Future - Comprehensive metabolic panel with GFR; Future - Lipid panel; Future - TSH; Future - mirabegron  ER (MYRBETRIQ ) 50 MG TB24 tablet; Take 1 tablet (50 mg total) by mouth daily.  Dispense: 90 tablet; Refill: 3  7. Primary osteoarthritis involving multiple joints - Can continue with Tylenol  as needed. She does not feel like she needs anything stronger   8. Glaucoma of both eyes, unspecified glaucoma type - Per opthalmology   9. Need for influenza vaccination  - Flu vaccine trivalent PF, 6mos and older(Flulaval,Afluria,Fluarix,Fluzone)  Armend Hochstatter, NP

## 2023-11-13 ENCOUNTER — Other Ambulatory Visit: Payer: Self-pay | Admitting: Adult Health

## 2023-11-13 DIAGNOSIS — N3281 Overactive bladder: Secondary | ICD-10-CM

## 2023-11-19 ENCOUNTER — Telehealth: Payer: Self-pay | Admitting: *Deleted

## 2023-11-19 DIAGNOSIS — N3281 Overactive bladder: Secondary | ICD-10-CM

## 2023-11-19 NOTE — Telephone Encounter (Signed)
 Copied from CRM #[id number]. Topic: Clinical - Prescription Issue >> Nov 19, 2023 12:05 PM Thersia BROCKS wrote: Reason for CRM: Patient called in regarding prescription mirabegron  ER (MYRBETRIQ ) 50 MG TB24 tablet , states pharmacy has not received it and is unable to refill it

## 2023-11-20 MED ORDER — MIRABEGRON ER 50 MG PO TB24
50.0000 mg | ORAL_TABLET | Freq: Every day | ORAL | 3 refills | Status: AC
Start: 2023-11-20 — End: ?

## 2023-11-20 NOTE — Telephone Encounter (Signed)
 Patient notified of update  and verbalized understanding.

## 2023-12-05 DIAGNOSIS — Z1231 Encounter for screening mammogram for malignant neoplasm of breast: Secondary | ICD-10-CM | POA: Diagnosis not present

## 2023-12-05 LAB — HM MAMMOGRAPHY

## 2023-12-06 ENCOUNTER — Other Ambulatory Visit: Payer: Self-pay | Admitting: Adult Health

## 2023-12-31 ENCOUNTER — Other Ambulatory Visit (HOSPITAL_COMMUNITY): Payer: Self-pay

## 2023-12-31 MED ORDER — LATANOPROST 0.005 % OP SOLN: 1.0000 [drp] | Freq: Every day | OPHTHALMIC | 2 refills | Status: AC

## 2023-12-31 MED ORDER — APIXABAN 5 MG PO TABS: 5.0000 mg | ORAL_TABLET | Freq: Two times a day (BID) | ORAL | 3 refills | Status: AC

## 2023-12-31 MED ORDER — MIRABEGRON ER 50 MG PO TB24
50.0000 mg | ORAL_TABLET | Freq: Every day | ORAL | 3 refills | Status: AC
  Filled 2024-02-24: qty 90, 90d supply, fill #0

## 2024-01-16 ENCOUNTER — Other Ambulatory Visit (HOSPITAL_COMMUNITY): Payer: Self-pay

## 2024-01-16 ENCOUNTER — Other Ambulatory Visit: Payer: Self-pay | Admitting: Adult Health

## 2024-01-16 ENCOUNTER — Other Ambulatory Visit: Payer: Self-pay

## 2024-01-16 DIAGNOSIS — I10 Essential (primary) hypertension: Secondary | ICD-10-CM

## 2024-01-16 MED ORDER — POTASSIUM CHLORIDE CRYS ER 20 MEQ PO TBCR
20.0000 meq | EXTENDED_RELEASE_TABLET | Freq: Every day | ORAL | 3 refills | Status: AC
  Filled 2024-01-16: qty 90, 90d supply, fill #0

## 2024-01-16 MED FILL — Amlodipine Besylate Tab 2.5 MG (Base Equivalent): ORAL | 90 days supply | Qty: 90 | Fill #0 | Status: AC

## 2024-01-16 NOTE — Telephone Encounter (Signed)
 Copied from CRM [id number]. Topic: Clinical - Medication Refill >> Jan 16, 2024 11:32 AM Burnard DEL wrote: Medication: potassium chloride  SA (KLOR-CON  M) 20 MEQ tablet  Has the patient contacted their pharmacy? No (Agent: If no, request that the patient contact the pharmacy for the refill. If patient does not wish to contact the pharmacy document the reason why and proceed with request.) (Agent: If yes, when and what did the pharmacy advise?)  This is the patient's preferred pharmacy:  [facility] [address] [place] [address] Phone: [phone] Fax: [phone]  Is this the correct pharmacy for this prescription? Yes If no, delete pharmacy and type the correct one.   Has the prescription been filled recently? No  Is the patient out of the medication? No  Has the patient been seen for an appointment in the last year OR does the patient have an upcoming appointment? Yes  Can we respond through MyChart? Yes  Agent: Please be advised that Rx refills may take up to 3 business days. We ask that you follow-up with your pharmacy.

## 2024-01-17 ENCOUNTER — Other Ambulatory Visit (HOSPITAL_COMMUNITY): Payer: Self-pay

## 2024-02-18 ENCOUNTER — Other Ambulatory Visit: Payer: Self-pay | Admitting: Adult Health

## 2024-02-24 ENCOUNTER — Other Ambulatory Visit (HOSPITAL_COMMUNITY): Payer: Self-pay

## 2024-03-06 ENCOUNTER — Other Ambulatory Visit (HOSPITAL_COMMUNITY): Payer: Self-pay

## 2024-03-06 MED FILL — Apixaban Tab 5 MG: ORAL | 90 days supply | Qty: 180 | Fill #0 | Status: AC

## 2024-04-03 ENCOUNTER — Other Ambulatory Visit (HOSPITAL_COMMUNITY): Payer: Self-pay

## 2024-04-03 MED ORDER — LATANOPROST 0.005 % OP SOLN
1.0000 [drp] | Freq: Every evening | OPHTHALMIC | 3 refills | Status: AC
  Filled 2024-04-03: qty 10, 100d supply, fill #0

## 2024-04-28 ENCOUNTER — Ambulatory Visit: Admitting: Adult Health

## 2024-05-18 ENCOUNTER — Ambulatory Visit
